# Patient Record
Sex: Female | Born: 1945 | Race: White | Hispanic: No | Marital: Married | Smoking: Never smoker
Health system: Southern US, Community
[De-identification: ages and names within clinical notes are randomized; demographics above are authoritative.]

## PROBLEM LIST (undated history)

## (undated) DIAGNOSIS — I10 Essential (primary) hypertension: Secondary | ICD-10-CM

## (undated) DIAGNOSIS — I219 Acute myocardial infarction, unspecified: Secondary | ICD-10-CM

## (undated) DIAGNOSIS — M1990 Unspecified osteoarthritis, unspecified site: Secondary | ICD-10-CM

## (undated) DIAGNOSIS — I209 Angina pectoris, unspecified: Secondary | ICD-10-CM

## (undated) DIAGNOSIS — I2510 Atherosclerotic heart disease of native coronary artery without angina pectoris: Secondary | ICD-10-CM

## (undated) DIAGNOSIS — E039 Hypothyroidism, unspecified: Secondary | ICD-10-CM

## (undated) DIAGNOSIS — C801 Malignant (primary) neoplasm, unspecified: Secondary | ICD-10-CM

## (undated) DIAGNOSIS — R0600 Dyspnea, unspecified: Secondary | ICD-10-CM

## (undated) DIAGNOSIS — K219 Gastro-esophageal reflux disease without esophagitis: Secondary | ICD-10-CM

## (undated) DIAGNOSIS — I509 Heart failure, unspecified: Secondary | ICD-10-CM

## (undated) DIAGNOSIS — T7840XA Allergy, unspecified, initial encounter: Secondary | ICD-10-CM

## (undated) DIAGNOSIS — G4730 Sleep apnea, unspecified: Secondary | ICD-10-CM

## (undated) DIAGNOSIS — H269 Unspecified cataract: Secondary | ICD-10-CM

## (undated) HISTORY — DX: Atherosclerotic heart disease of native coronary artery without angina pectoris: I25.10

## (undated) HISTORY — DX: Allergy, unspecified, initial encounter: T78.40XA

## (undated) HISTORY — PX: HERNIA REPAIR: SHX51

## (undated) HISTORY — PX: ABDOMINAL HYSTERECTOMY: SHX81

## (undated) HISTORY — DX: Malignant (primary) neoplasm, unspecified: C80.1

## (undated) HISTORY — PX: BUNIONECTOMY: SHX129

## (undated) HISTORY — PX: BLADDER SURGERY: SHX569

## (undated) HISTORY — DX: Heart failure, unspecified: I50.9

## (undated) HISTORY — PX: CHOLECYSTECTOMY: SHX55

## (undated) HISTORY — PX: APPENDECTOMY: SHX54

## (undated) HISTORY — DX: Sleep apnea, unspecified: G47.30

## (undated) HISTORY — PX: COLON SURGERY: SHX602

## (undated) HISTORY — PX: CATARACT EXTRACTION: SUR2

## (undated) HISTORY — DX: Unspecified osteoarthritis, unspecified site: M19.90

## (undated) HISTORY — DX: Unspecified cataract: H26.9

## (undated) HISTORY — DX: Acute myocardial infarction, unspecified: I21.9

---

## 2013-09-13 DIAGNOSIS — M79606 Pain in leg, unspecified: Secondary | ICD-10-CM | POA: Insufficient documentation

## 2013-09-13 DIAGNOSIS — M6281 Muscle weakness (generalized): Secondary | ICD-10-CM | POA: Insufficient documentation

## 2014-02-20 DIAGNOSIS — E039 Hypothyroidism, unspecified: Secondary | ICD-10-CM | POA: Insufficient documentation

## 2014-02-20 DIAGNOSIS — M1990 Unspecified osteoarthritis, unspecified site: Secondary | ICD-10-CM | POA: Insufficient documentation

## 2014-03-15 DIAGNOSIS — E663 Overweight: Secondary | ICD-10-CM | POA: Insufficient documentation

## 2015-08-08 DIAGNOSIS — H9190 Unspecified hearing loss, unspecified ear: Secondary | ICD-10-CM | POA: Insufficient documentation

## 2016-06-02 DIAGNOSIS — G8929 Other chronic pain: Secondary | ICD-10-CM | POA: Insufficient documentation

## 2016-06-02 DIAGNOSIS — F5104 Psychophysiologic insomnia: Secondary | ICD-10-CM | POA: Insufficient documentation

## 2016-06-02 DIAGNOSIS — Z85828 Personal history of other malignant neoplasm of skin: Secondary | ICD-10-CM | POA: Insufficient documentation

## 2016-10-13 DIAGNOSIS — Z85038 Personal history of other malignant neoplasm of large intestine: Secondary | ICD-10-CM | POA: Insufficient documentation

## 2017-05-10 DIAGNOSIS — D239 Other benign neoplasm of skin, unspecified: Secondary | ICD-10-CM | POA: Insufficient documentation

## 2017-05-28 DIAGNOSIS — E559 Vitamin D deficiency, unspecified: Secondary | ICD-10-CM | POA: Insufficient documentation

## 2017-09-04 DIAGNOSIS — I700 Atherosclerosis of aorta: Secondary | ICD-10-CM | POA: Insufficient documentation

## 2017-10-22 DIAGNOSIS — F3341 Major depressive disorder, recurrent, in partial remission: Secondary | ICD-10-CM | POA: Insufficient documentation

## 2018-02-24 DIAGNOSIS — H9313 Tinnitus, bilateral: Secondary | ICD-10-CM | POA: Insufficient documentation

## 2018-04-10 DIAGNOSIS — E7800 Pure hypercholesterolemia, unspecified: Secondary | ICD-10-CM | POA: Insufficient documentation

## 2018-08-23 DIAGNOSIS — I779 Disorder of arteries and arterioles, unspecified: Secondary | ICD-10-CM | POA: Insufficient documentation

## 2019-06-09 DIAGNOSIS — M7711 Lateral epicondylitis, right elbow: Secondary | ICD-10-CM | POA: Insufficient documentation

## 2019-10-03 DIAGNOSIS — Z7189 Other specified counseling: Secondary | ICD-10-CM | POA: Insufficient documentation

## 2019-10-05 DIAGNOSIS — M67921 Unspecified disorder of synovium and tendon, right upper arm: Secondary | ICD-10-CM | POA: Insufficient documentation

## 2019-10-06 DIAGNOSIS — K439 Ventral hernia without obstruction or gangrene: Secondary | ICD-10-CM | POA: Insufficient documentation

## 2020-08-02 DIAGNOSIS — F32A Depression, unspecified: Secondary | ICD-10-CM | POA: Insufficient documentation

## 2020-12-22 ENCOUNTER — Encounter
Admission: EM | Disposition: A | Discharge status: Home / Self Care | Payer: Self-pay | Source: Home / Self Care | Attending: Internal Medicine

## 2020-12-22 ENCOUNTER — Emergency Department: Payer: Medicare Other

## 2020-12-22 ENCOUNTER — Other Ambulatory Visit: Payer: Self-pay

## 2020-12-22 ENCOUNTER — Inpatient Hospital Stay
Admission: EM | Admit: 2020-12-22 | Discharge: 2020-12-26 | DRG: 246 | Disposition: A | Discharge status: Home / Self Care | Payer: Medicare Other | Attending: Internal Medicine | Admitting: Internal Medicine

## 2020-12-22 ENCOUNTER — Encounter: Payer: Self-pay | Admitting: Registered Nurse

## 2020-12-22 DIAGNOSIS — I2102 ST elevation (STEMI) myocardial infarction involving left anterior descending coronary artery: Principal | ICD-10-CM

## 2020-12-22 DIAGNOSIS — I10 Essential (primary) hypertension: Secondary | ICD-10-CM | POA: Diagnosis not present

## 2020-12-22 DIAGNOSIS — E669 Obesity, unspecified: Secondary | ICD-10-CM | POA: Diagnosis present

## 2020-12-22 DIAGNOSIS — J1282 Pneumonia due to coronavirus disease 2019: Secondary | ICD-10-CM

## 2020-12-22 DIAGNOSIS — E039 Hypothyroidism, unspecified: Secondary | ICD-10-CM | POA: Diagnosis present

## 2020-12-22 DIAGNOSIS — J9601 Acute respiratory failure with hypoxia: Secondary | ICD-10-CM | POA: Diagnosis not present

## 2020-12-22 DIAGNOSIS — Z85038 Personal history of other malignant neoplasm of large intestine: Secondary | ICD-10-CM | POA: Diagnosis not present

## 2020-12-22 DIAGNOSIS — Z6828 Body mass index (BMI) 28.0-28.9, adult: Secondary | ICD-10-CM | POA: Diagnosis not present

## 2020-12-22 DIAGNOSIS — I255 Ischemic cardiomyopathy: Secondary | ICD-10-CM | POA: Diagnosis present

## 2020-12-22 DIAGNOSIS — Z885 Allergy status to narcotic agent status: Secondary | ICD-10-CM | POA: Diagnosis not present

## 2020-12-22 DIAGNOSIS — I5023 Acute on chronic systolic (congestive) heart failure: Secondary | ICD-10-CM | POA: Diagnosis not present

## 2020-12-22 DIAGNOSIS — I214 Non-ST elevation (NSTEMI) myocardial infarction: Secondary | ICD-10-CM

## 2020-12-22 DIAGNOSIS — K5289 Other specified noninfective gastroenteritis and colitis: Secondary | ICD-10-CM | POA: Diagnosis present

## 2020-12-22 DIAGNOSIS — E785 Hyperlipidemia, unspecified: Secondary | ICD-10-CM | POA: Diagnosis present

## 2020-12-22 DIAGNOSIS — E876 Hypokalemia: Secondary | ICD-10-CM | POA: Diagnosis present

## 2020-12-22 DIAGNOSIS — R197 Diarrhea, unspecified: Secondary | ICD-10-CM

## 2020-12-22 DIAGNOSIS — R112 Nausea with vomiting, unspecified: Secondary | ICD-10-CM

## 2020-12-22 DIAGNOSIS — I110 Hypertensive heart disease with heart failure: Secondary | ICD-10-CM | POA: Diagnosis present

## 2020-12-22 DIAGNOSIS — R0600 Dyspnea, unspecified: Secondary | ICD-10-CM

## 2020-12-22 DIAGNOSIS — R001 Bradycardia, unspecified: Secondary | ICD-10-CM | POA: Diagnosis not present

## 2020-12-22 DIAGNOSIS — I2510 Atherosclerotic heart disease of native coronary artery without angina pectoris: Secondary | ICD-10-CM | POA: Diagnosis present

## 2020-12-22 DIAGNOSIS — J9811 Atelectasis: Secondary | ICD-10-CM | POA: Diagnosis not present

## 2020-12-22 DIAGNOSIS — R079 Chest pain, unspecified: Secondary | ICD-10-CM

## 2020-12-22 DIAGNOSIS — U071 COVID-19: Secondary | ICD-10-CM

## 2020-12-22 DIAGNOSIS — I2109 ST elevation (STEMI) myocardial infarction involving other coronary artery of anterior wall: Secondary | ICD-10-CM | POA: Diagnosis present

## 2020-12-22 DIAGNOSIS — I213 ST elevation (STEMI) myocardial infarction of unspecified site: Secondary | ICD-10-CM | POA: Diagnosis present

## 2020-12-22 HISTORY — PX: CORONARY/GRAFT ACUTE MI REVASCULARIZATION: CATH118305

## 2020-12-22 HISTORY — DX: Essential (primary) hypertension: I10

## 2020-12-22 HISTORY — PX: LEFT HEART CATH AND CORONARY ANGIOGRAPHY: CATH118249

## 2020-12-22 LAB — CBC WITH DIFFERENTIAL/PLATELET
Abs Immature Granulocytes: 0.03 10*3/uL (ref 0.00–0.07)
Basophils Absolute: 0.1 10*3/uL (ref 0.0–0.1)
Basophils Relative: 1 %
Eosinophils Absolute: 0.1 10*3/uL (ref 0.0–0.5)
Eosinophils Relative: 2 %
HCT: 33.6 % — ABNORMAL LOW (ref 36.0–46.0)
Hemoglobin: 10.8 g/dL — ABNORMAL LOW (ref 12.0–15.0)
Immature Granulocytes: 1 %
Lymphocytes Relative: 18 %
Lymphs Abs: 1.1 10*3/uL (ref 0.7–4.0)
MCH: 26.3 pg (ref 26.0–34.0)
MCHC: 32.1 g/dL (ref 30.0–36.0)
MCV: 81.8 fL (ref 80.0–100.0)
Monocytes Absolute: 1 10*3/uL (ref 0.1–1.0)
Monocytes Relative: 16 %
Neutro Abs: 3.9 10*3/uL (ref 1.7–7.7)
Neutrophils Relative %: 62 %
Platelets: 299 10*3/uL (ref 150–400)
RBC: 4.11 MIL/uL (ref 3.87–5.11)
RDW: 14.3 % (ref 11.5–15.5)
WBC: 6.2 10*3/uL (ref 4.0–10.5)
nRBC: 0 % (ref 0.0–0.2)

## 2020-12-22 LAB — TROPONIN I (HIGH SENSITIVITY)
Troponin I (High Sensitivity): 60 ng/L — ABNORMAL HIGH (ref <18)
Troponin I (High Sensitivity): >27000 ng/L (ref <18)
Troponin I (High Sensitivity): >27000 ng/L (ref <18)

## 2020-12-22 LAB — COMPREHENSIVE METABOLIC PANEL
ALT: 24 U/L (ref 0–44)
AST: 29 U/L (ref 15–41)
Albumin: 3.7 g/dL (ref 3.5–5.0)
Alkaline Phosphatase: 66 U/L (ref 38–126)
Anion gap: 11 (ref 5–15)
BUN: 20 mg/dL (ref 8–23)
CO2: 27 mmol/L (ref 22–32)
Calcium: 8.7 mg/dL — ABNORMAL LOW (ref 8.9–10.3)
Chloride: 95 mmol/L — ABNORMAL LOW (ref 98–111)
Creatinine, Ser: 0.96 mg/dL (ref 0.44–1.00)
GFR, Estimated: >60 mL/min (ref >60)
Glucose, Bld: 130 mg/dL — ABNORMAL HIGH (ref 70–99)
Potassium: 3 mmol/L — ABNORMAL LOW (ref 3.5–5.1)
Sodium: 133 mmol/L — ABNORMAL LOW (ref 135–145)
Total Bilirubin: 0.7 mg/dL (ref 0.3–1.2)
Total Protein: 6.1 g/dL — ABNORMAL LOW (ref 6.5–8.1)

## 2020-12-22 LAB — RESP PANEL BY RT-PCR (FLU A&B, COVID) ARPGX2
Influenza A by PCR: NEGATIVE
Influenza B by PCR: NEGATIVE
SARS Coronavirus 2 by RT PCR: POSITIVE — AB

## 2020-12-22 LAB — MRSA PCR SCREENING: MRSA by PCR: NEGATIVE

## 2020-12-22 LAB — LIPID PANEL
Cholesterol: 191 mg/dL (ref 0–200)
HDL: 70 mg/dL (ref >40)
LDL Cholesterol: 92 mg/dL (ref 0–99)
Total CHOL/HDL Ratio: 2.7 RATIO
Triglycerides: 144 mg/dL (ref <150)
VLDL: 29 mg/dL (ref 0–40)

## 2020-12-22 LAB — BRAIN NATRIURETIC PEPTIDE: B Natriuretic Peptide: 185.2 pg/mL — ABNORMAL HIGH (ref 0.0–100.0)

## 2020-12-22 LAB — HEMOGLOBIN A1C
Hgb A1c MFr Bld: 5.7 % — ABNORMAL HIGH (ref 4.8–5.6)
Mean Plasma Glucose: 116.89 mg/dL

## 2020-12-22 LAB — GLUCOSE, CAPILLARY: Glucose-Capillary: 125 mg/dL — ABNORMAL HIGH (ref 70–99)

## 2020-12-22 LAB — POCT ACTIVATED CLOTTING TIME: Activated Clotting Time: 553 seconds

## 2020-12-22 LAB — PROTIME-INR
INR: 0.9 (ref 0.8–1.2)
Prothrombin Time: 11.9 seconds (ref 11.4–15.2)

## 2020-12-22 LAB — APTT: aPTT: 29 seconds (ref 24–36)

## 2020-12-22 SURGERY — CORONARY/GRAFT ACUTE MI REVASCULARIZATION
Anesthesia: Moderate Sedation

## 2020-12-22 MED ORDER — LIDOCAINE HCL (PF) 1 % IJ SOLN
INTRAMUSCULAR | Status: DC | PRN
  Administered 2020-12-22: 30 mL

## 2020-12-22 MED ORDER — SODIUM CHLORIDE 0.9% FLUSH
3.0000 mL | Freq: Two times a day (BID) | INTRAVENOUS | Status: DC
  Administered 2020-12-23 – 2020-12-26 (×8): 3 mL via INTRAVENOUS

## 2020-12-22 MED ORDER — HEPARIN (PORCINE) 25000 UT/250ML-% IV SOLN: 850.0000 [IU]/h | INTRAVENOUS | Status: DC

## 2020-12-22 MED ORDER — ATORVASTATIN CALCIUM 80 MG PO TABS
80.0000 mg | ORAL_TABLET | Freq: Every day | ORAL | Status: DC
  Administered 2020-12-23 – 2020-12-26 (×4): 80 mg via ORAL
  Filled 2020-12-22 (×2): qty 1
  Filled 2020-12-22: qty 4
  Filled 2020-12-22: qty 1

## 2020-12-22 MED ORDER — TICAGRELOR 90 MG PO TABS
90.0000 mg | ORAL_TABLET | Freq: Two times a day (BID) | ORAL | Status: DC
  Administered 2020-12-22 – 2020-12-26 (×8): 90 mg via ORAL
  Filled 2020-12-22 (×8): qty 1

## 2020-12-22 MED ORDER — HEPARIN (PORCINE) IN NACL 1000-0.9 UT/500ML-% IV SOLN
INTRAVENOUS | Status: AC
  Filled 2020-12-22: qty 1000

## 2020-12-22 MED ORDER — MIDAZOLAM HCL 2 MG/2ML IJ SOLN
INTRAMUSCULAR | Status: AC
  Filled 2020-12-22: qty 2

## 2020-12-22 MED ORDER — HEPARIN (PORCINE) IN NACL 2000-0.9 UNIT/L-% IV SOLN
INTRAVENOUS | Status: DC | PRN
  Administered 2020-12-22: 1000 mL

## 2020-12-22 MED ORDER — SODIUM CHLORIDE 0.9 % IV SOLN
INTRAVENOUS | Status: AC | PRN
  Administered 2020-12-22: 1.75 mg/kg/h via INTRAVENOUS

## 2020-12-22 MED ORDER — FENTANYL CITRATE (PF) 100 MCG/2ML IJ SOLN
INTRAMUSCULAR | Status: AC
  Filled 2020-12-22: qty 2

## 2020-12-22 MED ORDER — SODIUM CHLORIDE 0.9 % IV SOLN
250.0000 mL | INTRAVENOUS | Status: DC | PRN
  Administered 2020-12-26 (×2): 250 mL via INTRAVENOUS

## 2020-12-22 MED ORDER — LISINOPRIL 5 MG PO TABS
2.5000 mg | ORAL_TABLET | Freq: Every day | ORAL | Status: DC
  Filled 2020-12-22 (×2): qty 1

## 2020-12-22 MED ORDER — LABETALOL HCL 5 MG/ML IV SOLN: 10.0000 mg | INTRAVENOUS | Status: AC | PRN

## 2020-12-22 MED ORDER — SODIUM CHLORIDE 0.9 % WEIGHT BASED INFUSION
1.0000 mL/kg/h | INTRAVENOUS | Status: AC
  Administered 2020-12-22 – 2020-12-23 (×2): 1 mL/kg/h via INTRAVENOUS

## 2020-12-22 MED ORDER — ASPIRIN 81 MG PO CHEW
81.0000 mg | CHEWABLE_TABLET | Freq: Every day | ORAL | Status: DC
  Administered 2020-12-23 – 2020-12-26 (×4): 81 mg via ORAL
  Filled 2020-12-22 (×4): qty 1

## 2020-12-22 MED ORDER — LIDOCAINE HCL (PF) 1 % IJ SOLN
INTRAMUSCULAR | Status: AC
  Filled 2020-12-22: qty 30

## 2020-12-22 MED ORDER — ACETAMINOPHEN 325 MG PO TABS
650.0000 mg | ORAL_TABLET | ORAL | Status: DC | PRN
  Administered 2020-12-23 (×3): 650 mg via ORAL
  Filled 2020-12-22 (×3): qty 2

## 2020-12-22 MED ORDER — SODIUM CHLORIDE 0.9 % IV SOLN
0.2500 mg/kg/h | INTRAVENOUS | Status: AC
  Filled 2020-12-22: qty 250

## 2020-12-22 MED ORDER — BIVALIRUDIN BOLUS VIA INFUSION - CUPID
INTRAVENOUS | Status: DC | PRN
  Administered 2020-12-22: 58.5 mg via INTRAVENOUS

## 2020-12-22 MED ORDER — TICAGRELOR 90 MG PO TABS
ORAL_TABLET | ORAL | Status: AC
  Filled 2020-12-22: qty 2

## 2020-12-22 MED ORDER — FENTANYL CITRATE (PF) 100 MCG/2ML IJ SOLN
INTRAMUSCULAR | Status: DC | PRN
  Administered 2020-12-22 (×2): 50 ug via INTRAVENOUS

## 2020-12-22 MED ORDER — SODIUM CHLORIDE 0.9 % IV BOLUS
1000.0000 mL | Freq: Once | INTRAVENOUS | Status: AC
  Administered 2020-12-22: 1000 mL via INTRAVENOUS

## 2020-12-22 MED ORDER — MIDAZOLAM HCL 2 MG/2ML IJ SOLN
INTRAMUSCULAR | Status: DC | PRN
  Administered 2020-12-22 (×2): 1 mg via INTRAVENOUS

## 2020-12-22 MED ORDER — BIVALIRUDIN TRIFLUOROACETATE 250 MG IV SOLR
INTRAVENOUS | Status: AC
  Filled 2020-12-22: qty 250

## 2020-12-22 MED ORDER — HEPARIN (PORCINE) 25000 UT/250ML-% IV SOLN
INTRAVENOUS | Status: AC
  Administered 2020-12-22: 850 [IU]/h via INTRAVENOUS
  Filled 2020-12-22: qty 250

## 2020-12-22 MED ORDER — SODIUM CHLORIDE 0.9 % IV SOLN: INTRAVENOUS | Status: DC

## 2020-12-22 MED ORDER — SODIUM CHLORIDE 0.9% FLUSH
3.0000 mL | INTRAVENOUS | Status: DC | PRN
  Administered 2020-12-24: 3 mL via INTRAVENOUS

## 2020-12-22 MED ORDER — HEPARIN SODIUM (PORCINE) 5000 UNIT/ML IJ SOLN
4000.0000 [IU] | Freq: Once | INTRAMUSCULAR | Status: AC
  Administered 2020-12-22: 4000 [IU] via INTRAVENOUS
  Filled 2020-12-22: qty 1

## 2020-12-22 MED ORDER — METOPROLOL TARTRATE 25 MG PO TABS
12.5000 mg | ORAL_TABLET | Freq: Two times a day (BID) | ORAL | Status: DC
  Administered 2020-12-22 – 2020-12-23 (×3): 12.5 mg via ORAL
  Filled 2020-12-22: qty 1
  Filled 2020-12-22: qty 0.5
  Filled 2020-12-22 (×2): qty 1

## 2020-12-22 MED ORDER — HYDRALAZINE HCL 20 MG/ML IJ SOLN: 10.0000 mg | INTRAMUSCULAR | Status: AC | PRN

## 2020-12-22 MED ORDER — IOHEXOL 300 MG/ML  SOLN
INTRAMUSCULAR | Status: DC | PRN
  Administered 2020-12-22: 170 mL

## 2020-12-22 MED ORDER — ONDANSETRON HCL 4 MG/2ML IJ SOLN
4.0000 mg | Freq: Four times a day (QID) | INTRAMUSCULAR | Status: DC | PRN
  Administered 2020-12-23 (×2): 4 mg via INTRAVENOUS
  Filled 2020-12-22 (×2): qty 2

## 2020-12-22 MED ORDER — TICAGRELOR 90 MG PO TABS
ORAL_TABLET | ORAL | Status: DC | PRN
  Administered 2020-12-22: 180 mg via ORAL

## 2020-12-22 SURGICAL SUPPLY — 17 items
BALLN TREK RX 2.5X15 (BALLOONS) ×2
BALLN ~~LOC~~ TREK RX 2.75X15 (BALLOONS) ×2
BALLOON TREK RX 2.5X15 (BALLOONS) ×1 IMPLANT
BALLOON ~~LOC~~ TREK RX 2.75X15 (BALLOONS) ×1 IMPLANT
CATH INFINITI 5FR JL4 (CATHETERS) ×2 IMPLANT
CATH INFINITI JR4 5F (CATHETERS) ×2 IMPLANT
CATH VISTA GUIDE 6FR XB3.5 (CATHETERS) ×2 IMPLANT
DEVICE CLOSURE MYNXGRIP 6/7F (Vascular Products) ×2 IMPLANT
DEVICE SAFEGUARD 24CM (GAUZE/BANDAGES/DRESSINGS) ×2 IMPLANT
KIT ENCORE 26 ADVANTAGE (KITS) ×2 IMPLANT
KIT MANI 3VAL PERCEP (MISCELLANEOUS) ×2 IMPLANT
NEEDLE PERC 18GX7CM (NEEDLE) ×2 IMPLANT
PACK CARDIAC CATH (CUSTOM PROCEDURE TRAY) ×2 IMPLANT
SHEATH AVANTI 6FR X 11CM (SHEATH) ×2 IMPLANT
STENT RESOLUTE ONYX 2.5X38 (Permanent Stent) ×2 IMPLANT
WIRE G HI TQ BMW 190 (WIRE) ×2 IMPLANT
WIRE GUIDERIGHT .035X150 (WIRE) ×2 IMPLANT

## 2020-12-22 NOTE — ED Notes (Signed)
Cardiologist at bedside at this time.

## 2020-12-22 NOTE — ED Provider Notes (Signed)
[facility] Emergency Department Provider Note   ____________________________________________   Event Date/Time   First MD Initiated Contact with Patient 12/22/20 1521     (approximate)  I have reviewed the triage vital signs and the nursing notes.   HISTORY  Chief Complaint Code STEMI    HPI Dyesha R Trace is a 75 y.o. female with a stated past medical history of hypertension who presents via EMS from a grocery store complaining of chest pain, nausea/vomiting/diarrhea, and left arm pain.  Patient states that this chest pain has been intermittent for the last 2 weeks without any exacerbating or factors but had sudden worsening today.  Patient was on the toilet and felt as if she was going to pass out and that is when she called EMS.  Patient was given 324 of aspirin as well as nitroglycerin spray which improved her pain prior to arrival.  EKG was transmitted by EMS with concerns for possible STEMI with elevation in anterior leads.  Patient currently denies any vision changes, tinnitus, difficulty speaking, facial droop, sore throat, abdominal pain, dysuria, or weakness/numbness/paresthesias in any extremity         Past Medical History:  Diagnosis Date  . Hypertension     There are no problems to display for this patient.   History reviewed. No pertinent surgical history.  Prior to Admission medications   Not on File    Allergies Morphine and related  History reviewed. No pertinent family history.  Social History    Review of Systems Constitutional: No fever/chills Eyes: No visual changes. ENT: No sore throat. Cardiovascular: Endorses chest pain. Respiratory: Denies shortness of breath. Gastrointestinal: No abdominal pain.  Endorses nausea/vomiting/diarrhea Genitourinary: Negative for dysuria. Musculoskeletal: Negative for acute arthralgias Skin: Negative for rash. Neurological: Negative for headaches, weakness/numbness/paresthesias  in any extremity Psychiatric: Negative for suicidal ideation/homicidal ideation   ____________________________________________   PHYSICAL EXAM:  VITAL SIGNS: ED Triage Vitals  Enc Vitals Group     BP --      Pulse Rate 12/22/20 1523 63     Resp 12/22/20 1523 20     Temp 12/22/20 1523 98.8 F (37.1 C)     Temp Source 12/22/20 1523 Oral     SpO2 12/22/20 1523 96 %     Weight 12/22/20 1524 171 lb 15.3 oz (78 kg)     Height 12/22/20 1524 5\' 4"  (1.626 m)     Head Circumference --      Peak Flow --      Pain Score 12/22/20 1523 2     Pain Loc --      Pain Edu? --      Excl. in [place]? --    Constitutional: Alert and oriented. Well appearing and in no acute distress. Eyes: Conjunctivae are normal. PERRL. Head: Atraumatic. Nose: No congestion/rhinnorhea. Mouth/Throat: Mucous membranes are moist. Neck: No stridor Cardiovascular: Grossly normal heart sounds.  Good peripheral circulation. Respiratory: Normal respiratory effort.  No retractions. Gastrointestinal: Soft and nontender. No distention. Musculoskeletal: No obvious deformities Neurologic:  Normal speech and language. No gross focal neurologic deficits are appreciated. Skin:  Skin is warm and dry. No rash noted. Psychiatric: Mood and affect are normal. Speech and behavior are normal.  ____________________________________________   LABS (all labs ordered are listed, but only abnormal results are displayed)  Labs Reviewed  RESP PANEL BY RT-PCR (FLU A&B, COVID) ARPGX2 - Abnormal; Notable for the following components:      Result Value   SARS Coronavirus  2 by RT PCR POSITIVE (*)    All other components within normal limits  CBC WITH DIFFERENTIAL/PLATELET - Abnormal; Notable for the following components:   Hemoglobin 10.8 (*)    HCT 33.6 (*)    All other components within normal limits  COMPREHENSIVE METABOLIC PANEL - Abnormal; Notable for the following components:   Sodium 133 (*)    Potassium 3.0 (*)    Chloride 95  (*)    Glucose, Bld 130 (*)    Calcium 8.7 (*)    Total Protein 6.1 (*)    All other components within normal limits  BRAIN NATRIURETIC PEPTIDE - Abnormal; Notable for the following components:   B Natriuretic Peptide 185.2 (*)    All other components within normal limits  TROPONIN I (HIGH SENSITIVITY) - Abnormal; Notable for the following components:   Troponin I (High Sensitivity) 60 (*)    All other components within normal limits  PROTIME-INR  APTT  LIPID PANEL  HEMOGLOBIN A1C  HEPARIN LEVEL (UNFRACTIONATED)   ____________________________________________  EKG  ED ECG REPORT I, Merwyn Katos, the attending physician, personally viewed and interpreted this ECG.  Date: 12/22/2020 EKG Time: 1521 Rate: 58 Rhythm: normal sinus rhythm QRS Axis: normal Intervals: normal ST/T Wave abnormalities: normal Narrative Interpretation: Elevation in V1/V2/V3 with minimal reciprocal change in inferior leads  ____________________________________________  RADIOLOGY  ED MD interpretation: Single view portable x-ray of the chest shows a mild patchy opacity at the right lung base concerning for pneumonia.  No evidence of pneumothorax or widened mediastinum  Official radiology report(s): DG Chest Port 1 View  Result Date: 12/22/2020 CLINICAL DATA:  Chest pain. EXAM: PORTABLE CHEST 1 VIEW COMPARISON:  None. FINDINGS: The heart size and mediastinal contours are within normal limits. Mild patchy opacity of right lung base is noted. There is no pleural effusion or pulmonary edema. The visualized skeletal structures are unremarkable. IMPRESSION: Mild patchy opacity of right lung base, suspicious for pneumonia. Electronically Signed   By: Sherian Rein M.D.   On: 12/22/2020 15:48    ____________________________________________   PROCEDURES  Procedure(s) performed (including Critical Care):  .Critical Care Performed by: Merwyn Katos, MD Authorized by: Merwyn Katos, MD   Critical  care provider statement:    Critical care time (minutes):  17   Critical care time was exclusive of:  Separately billable procedures and treating other patients   Critical care was necessary to treat or prevent imminent or life-threatening deterioration of the following conditions:  Cardiac failure   Critical care was time spent personally by me on the following activities:  Discussions with consultants, evaluation of patient's response to treatment, examination of patient, ordering and performing treatments and interventions, ordering and review of laboratory studies, ordering and review of radiographic studies, pulse oximetry, re-evaluation of patient's condition, obtaining history from patient or surrogate and review of old charts   I assumed direction of critical care for this patient from another provider in my specialty: no     Care discussed with: admitting provider   .1-3 Lead EKG Interpretation Performed by: Merwyn Katos, MD Authorized by: Merwyn Katos, MD     Interpretation: normal     ECG rate:  61   ECG rate assessment: normal     Rhythm: sinus rhythm     Ectopy: none     Conduction: normal       ____________________________________________   INITIAL IMPRESSION / ASSESSMENT AND PLAN / ED COURSE  As part of my  medical decision making, I reviewed the following data within the [place] notes reviewed and incorporated, Labs reviewed, EKG interpreted, Old chart reviewed, Radiograph reviewed and Notes from prior ED visits reviewed and incorporated        Pt presents via EMS with active chest pain and an EKG concerning for ST Elevation MI. Pt received 324mg  aspirin, and sublingual nitroglycerin spray in the field. Patient was otherwise in their normal state of health before this chest pain rapidly ensued.  DDx: TAA or dissection, pericarditis/tamponade, PTX.  Workup: CBC, BMP, troponin, T&S, PT/INR, PTT, Repeat EKGs until cath lab,  CXR.  Therapy: Not hypoxic, not requiring O2. Completed full ASA load 324mg . Anticoagulation: Pt <75yo and with suspected normal kidney function, will start Heparin bolus 60U/kg followed by 12U/kg/h gtt.  Disposition: Admit directly to Cath Lab.      ____________________________________________   FINAL CLINICAL IMPRESSION(S) / ED DIAGNOSES  Final diagnoses:  Chest pain of uncertain etiology  Non-ST elevation (NSTEMI) myocardial infarction (HCC)  Nausea vomiting and diarrhea     ED Discharge Orders    None       Note:  This document was prepared using Dragon voice recognition software and may include unintentional dictation errors.   Naaman Plummer, MD 12/22/20 [phone]

## 2020-12-22 NOTE — H&P (Signed)
History and Physical    PLEASE NOTE THAT DRAGON DICTATION SOFTWARE WAS USED IN THE CONSTRUCTION OF THIS NOTE.   [name]:[id number] DOB: May 09, 1946 DOA: 12/22/2020  PCP: No primary care provider on file. Patient coming from: home   I have personally briefly reviewed patient's old medical records in [place]  Chief Complaint: STEMI s/p PCI with stent to LAD  HPI: Karen Madden is a 75 y.o. female with medical history significant for hypertension who is admitted to [facility] on 12/22/2020 with STEMI after being taken from [facility] ED directly to cath lab after presenting  to [facility] Emergency Department complaining of chest pain.   Patient taken to Cath Lab by Dr. Clayborn Bigness of cardiology with coronary angiography revealing ostial occlusion of the LAD, prompting PCI with placement of DES to the proximal LAD.  Postoperatively, patient to be admitted to the Hospitalist service for further monitoring in the setting of presenting STEMI status post angiography with drug-eluting stent placement to the proximal LAD, with Dr. Clayborn Bigness of cardiology to continue to follow on a consultation basis.  Patient without any chest pain postoperatively.  Also denies any palpitations, diaphoresis, nausea, or vomiting at this time.  Denies any orthopnea, PND.  Of note, routine screening nasopharyngeal COVID-19 PCR was found to be positive today.  The patient denies any recent shortness of breath, cough, wheezing.  Also denies any recent subjective fever, chills, rigors, or generalized myalgias.  Denies any recent headache, neck stiffness, rhinitis, rhinorrhea, or sore throat.  No recent known COVID-19 exposures.  Medical history notable for essential hypertension, for which the patient is managing via lifestyle modifications.     Review of Systems: As per HPI otherwise 10 point review of systems negative.   Past Medical History:  Diagnosis Date  . Hypertension     History  reviewed. No pertinent surgical history.  Social History:  has no history on file for tobacco use, alcohol use, and drug use.   Allergies  Allergen Reactions  . Morphine And Related Anxiety    Hallucinations, nausea, vomiting, "doesn't help my pain at all"    History reviewed. No pertinent family history.  Outpatient medications: Denies any use of prescribed or over-the-counter medications as an outpatient.    Objective    Physical Exam: Vitals:   12/22/20 1530 12/22/20 1541 12/22/20 1544 12/22/20 1602  BP: (!) 87/60 (!) 95/55 98/68   Pulse: 60 63 62   Resp: 11 10 16    Temp:      TempSrc:      SpO2: 94% 95% 96% 98%  Weight:      Height:        General: appears to be stated age; alert, oriented Skin: warm, dry, no rash Head:  AT/[place] Mouth:  Oral mucosa membranes appear moist, normal dentition Neck: supple; trachea midline Heart:  RRR; did not appreciate any M/R/G Lungs: CTAB, did not appreciate any wheezes, rales, or rhonchi Abdomen: + BS; soft, ND, NT Vascular: 2+ pedal pulses b/l; 2+ radial pulses b/l Extremities: no peripheral edema, no muscle wasting Neuro: strength and sensation intact in upper and lower extremities b/l   Labs on Admission: I have personally reviewed following labs and imaging studies  CBC: Recent Labs  Lab 12/22/20 1521  WBC 6.2  NEUTROABS 3.9  HGB 10.8*  HCT 33.6*  MCV 81.8  PLT 299   Basic Metabolic Panel: Recent Labs  Lab 12/22/20 1521  NA 133*  K 3.0*  CL  95*  CO2 27  GLUCOSE 130*  BUN 20  CREATININE 0.96  CALCIUM 8.7*   GFR: Estimated Creatinine Clearance: 51.9 mL/min (by C-G formula based on SCr of 0.96 mg/dL). Liver Function Tests: Recent Labs  Lab 12/22/20 1521  AST 29  ALT 24  ALKPHOS 66  BILITOT 0.7  PROT 6.1*  ALBUMIN 3.7   No results for input(s): LIPASE, AMYLASE in the last 168 hours. No results for input(s): AMMONIA in the last 168 hours. Coagulation Profile: Recent Labs  Lab 12/22/20 1521   INR 0.9   Cardiac Enzymes: No results for input(s): CKTOTAL, CKMB, CKMBINDEX, TROPONINI in the last 168 hours. BNP (last 3 results) No results for input(s): PROBNP in the last 8760 hours. HbA1C: No results for input(s): HGBA1C in the last 72 hours. CBG: Recent Labs  Lab 12/22/20 1718  GLUCAP 125*   Lipid Profile: Recent Labs    12/22/20 1521  CHOL 191  HDL 70  LDLCALC 92  TRIG 144  CHOLHDL 2.7   Thyroid Function Tests: No results for input(s): TSH, T4TOTAL, FREET4, T3FREE, THYROIDAB in the last 72 hours. Anemia Panel: No results for input(s): VITAMINB12, FOLATE, FERRITIN, TIBC, IRON, RETICCTPCT in the last 72 hours. Urine analysis: No results found for: COLORURINE, APPEARANCEUR, LABSPEC, [place], GLUCOSEU, [place], [place], [place], [place], UROBILINOGEN, NITRITE, LEUKOCYTESUR  Radiological Exams on Admission: CARDIAC CATHETERIZATION  Result Date: 12/22/2020  Ost LAD lesion is 80% stenosed.  Prox LAD lesion is 100% stenosed.  Ost Cx to Prox Cx lesion is 25% stenosed.  Mid Cx to Dist Cx lesion is 50% stenosed.  Prox RCA to Dist RCA lesion is 25% stenosed with 25% stenosed side branch in RPAV.  Dist LAD lesion is 90% stenosed.  Moderately depressed left ventricular function between 30 to 35% with anterior apical akinesis  Conclusion STEMI presentation EKG with subtle anterior ST elevation but septal anterior Q waves Diagnostic cardiac cath showed large left main free of disease LAD 100% occluded ostially TIMI 0 flow Circumflex was large with just minor disease TIMI-3 flow RCA was medium in size, diffuse 25 to 50% Left ventricle Moderately depressed left ventricular function 30 to 35% anterior apical akinesis Intervention Successful PCI and stent of ostial proximal LAD with DES 2.5 x 34 mm resolute Onyx Postdilated with a 2.75 x 15 mm [place] trek to 14 atm TIMI-3 flow was restored Appeared to be residual very distal apical lesion of 90% which was not identified until flow was  restored Distal LAD lesion was deferred Patient identity incidentally tested positive for Covid while on the table Minx was deployed to right groin femoral artery Patient transferred to CCU for recovery Hospitalist service  notified  DG Chest Port 1 View  Result Date: 12/22/2020 CLINICAL DATA:  Chest pain. EXAM: PORTABLE CHEST 1 VIEW COMPARISON:  None. FINDINGS: The heart size and mediastinal contours are within normal limits. Mild patchy opacity of right lung base is noted. There is no pleural effusion or pulmonary edema. The visualized skeletal structures are unremarkable. IMPRESSION: Mild patchy opacity of right lung base, suspicious for pneumonia. Electronically Signed   By: Abelardo Diesel M.D.   On: 12/22/2020 15:48      Assessment/Plan   Karen Madden is a 75 y.o. female with medical history significant for hypertension who is admitted to [facility] on 12/22/2020 with STEMI after being taken from [facility] ED directly to cath lab after presenting  to [facility] Emergency Department complaining of chest pain.    Principal  Problem:   STEMI involving left anterior descending coronary artery (HCC) Active Problems:   STEMI (ST elevation myocardial infarction) ([place])   CAD (coronary artery disease)   HTN (hypertension)   COVID-19 virus infection    #) STEMI: taken from ED to Cath Lab by Dr. Clayborn Bigness of cardiology with coronary angiography revealing ostial occlusion of the LAD, prompting PCI with placement of DES to the proximal LAD. Post-operatively, patient chest pain free. Dr. Clayborn Bigness folllowing on consultation basis.  He recommends dual antiplatelet therapy with aspirin 81 mg p.o. daily and ticagrelor 90 mg p.o. twice daily for at least the next 12 months.  Additionally, he was placed orders for completion of Angiomax infusion.   Plan: Dual antiplatelet therapy for greater than 12 months, is recommended in documentation from Dr. Clayborn Bigness.  Echocardiogram per recommendation of  cardiology.  Trending of troponin as ordered by cardiology.  Monitor on telemetry.  Monitor continuous pulse oximetry.  Metoprolol tartrate, lisinopril, and high intensity atorvastatin as ordered by cardiology.  Repeat CBC in the morning.      #) COVID-19 infection: Screening COVID-19 performed upon arrival at [facility] found to be positive in the absence of any associated respiratory symptoms.  Additionally presentation has not been associated the acute hypoxic respiratory distress.  Consequently, criteria are not met for patient's COVID-19 infection to be considered severe in nature, and therefore there is not currently an indication for initiation of dexamethasone.  Furthermore, as the patient is not being admitted for COVID-19 infection, and has been asymptomatic from a respiratory standpoint, will refrain from initiation of remdesivir, thereby maintaining the possibility of receiving monoclonal antibody therapy as an outpatient.   Plan: Airborne and contact precautions.  Monitor continuous pulse oximetry.  Check general inflammatory markers in the morning to establish baseline.  Refraining from dexamethasone as well as remdesivir, per rationale above.  Repeat CBC in the morning.      #) Essential hypertension: Has a documented history of such, although this appears to have been managed via lifestyle modifications as an outpatient.  In the setting of new diagnosis of coronary artery disease following presenting STEMI, the patient has been started on metoprolol tartrate as well as lisinopril.  Plan: Continue metoprolol tartrate as well as lisinopril, per cardiology recommendations, as above.  Close monitoring of ensuing blood pressures via routine vital signs.  Repeat BMP in the morning.  Monitor strict I's and O's.    Code Status: Full code Family Communication: none Disposition Plan: Per Rounding Team Consults called: Dr. Clayborn Bigness of cardiology consulted, as further described above. Admission  status: inpatient    Of note, this patient was added by me to the following Admit List/Treatment Team:  armcadmits      PLEASE NOTE THAT DRAGON DICTATION SOFTWARE WAS USED IN THE CONSTRUCTION OF THIS NOTE.   [place] [facility] Pager [phone] From 12PM- 12AM  Otherwise, please contact night-coverage  [email or web address] Password [facility]  12/22/2020, 5:40 PM

## 2020-12-22 NOTE — ED Notes (Signed)
Pt husband is Jazzmen Restivo [phone]

## 2020-12-22 NOTE — Progress Notes (Signed)
ANTICOAGULATION CONSULT NOTE  Pharmacy Consult for Heparin infusion Indication: chest pain/ACS  Allergies  Allergen Reactions  . Morphine And Related Anxiety    Hallucinations, nausea, vomiting, "doesn't help my pain at all"    Patient Measurements: Height: 5\' 4"  (162.6 cm) Weight: 78 kg (171 lb 15.3 oz) IBW/kg (Calculated) : 54.7 Heparin Dosing Weight: 71.3 kg  Vital Signs: Temp: 98.8 F (37.1 C) (01/02 1523) Temp Source: Oral (01/02 1523) BP: 95/55 (01/02 1541) Pulse Rate: 63 (01/02 1541)  Labs: Recent Labs    12/22/20 1521  HGB 10.8*  HCT 33.6*  PLT 299    CrCl cannot be calculated (No successful lab value found.).   Medical History: Past Medical History:  Diagnosis Date  . Hypertension     Assessment: 75yo female with a past medical history of hypertension who presents via EMS from a grocery store complaining of chest pain, nausea/vomiting/diarrhea, and left arm pain. EKG was transmitted by EMS with concerns for possible STEMI with elevation in anterior leads. Pharmacy has been consulted for heparin dosing and monitoring for ACE/chest pain. Baseline labs WNL. No PTA anticoagulation upon chart review.   Goal of Therapy:  Heparin level 0.3-0.7 units/ml Monitor platelets by anticoagulation protocol: Yes   Plan:  Give 4000 units bolus x 1 Start heparin infusion at 850 units/hr Check anti-Xa level in 8 hours and daily while on heparin Continue to monitor H&H and platelets  75yo, PharmD Pharmacy Resident  12/22/2020 3:44 PM

## 2020-12-22 NOTE — Progress Notes (Signed)
Responded to PG to ED for code stemi. Pt was alert and talking. I looked for Pt's husband. I found Pt's husband and escorted Pt to Cath lab waiting room. Pt could not go to ICU because wife was Covid positive. Ch will follow up later.

## 2020-12-22 NOTE — ED Notes (Signed)
Chart labels and EKG delivered to cath lab

## 2020-12-22 NOTE — ED Notes (Signed)
Pt states 7/10 pain in right arm. States it sometimes hurts worse. Comes and goes

## 2020-12-22 NOTE — ED Notes (Signed)
Cardiologist at bedside.  

## 2020-12-22 NOTE — ED Notes (Signed)
Pt to cath lab, 2 RN's and cardiologist to transport. Handoff report given in cath lab. Pt belongings sent with pt. 1 bag of personals

## 2020-12-22 NOTE — ED Triage Notes (Signed)
Pt arrives form grocery store via [facility] with complaint of chest pain. Pt states she's been having chest pain for about two weeks, but  with sudden worsening today. Pt also experienced new nausea, diarrhea, and left arm pain. Pt states she felt like she was going to pass out.   EMS reports pt received nitro spray, zofran en route.

## 2020-12-23 ENCOUNTER — Encounter: Payer: Self-pay | Admitting: Internal Medicine

## 2020-12-23 ENCOUNTER — Inpatient Hospital Stay: Admit: 2020-12-23 | Payer: Medicare Other

## 2020-12-23 DIAGNOSIS — J1282 Pneumonia due to coronavirus disease 2019: Secondary | ICD-10-CM

## 2020-12-23 DIAGNOSIS — I10 Essential (primary) hypertension: Secondary | ICD-10-CM | POA: Diagnosis present

## 2020-12-23 DIAGNOSIS — U071 COVID-19: Secondary | ICD-10-CM | POA: Diagnosis present

## 2020-12-23 DIAGNOSIS — I2510 Atherosclerotic heart disease of native coronary artery without angina pectoris: Secondary | ICD-10-CM | POA: Diagnosis present

## 2020-12-23 LAB — CBC
HCT: 34 % — ABNORMAL LOW (ref 36.0–46.0)
Hemoglobin: 11.3 g/dL — ABNORMAL LOW (ref 12.0–15.0)
MCH: 26.8 pg (ref 26.0–34.0)
MCHC: 33.2 g/dL (ref 30.0–36.0)
MCV: 80.6 fL (ref 80.0–100.0)
Platelets: 282 10*3/uL (ref 150–400)
RBC: 4.22 MIL/uL (ref 3.87–5.11)
RDW: 14.4 % (ref 11.5–15.5)
WBC: 6.8 10*3/uL (ref 4.0–10.5)
nRBC: 0 % (ref 0.0–0.2)

## 2020-12-23 LAB — COMPREHENSIVE METABOLIC PANEL
ALT: 66 U/L — ABNORMAL HIGH (ref 0–44)
AST: 428 U/L — ABNORMAL HIGH (ref 15–41)
Albumin: 3.6 g/dL (ref 3.5–5.0)
Alkaline Phosphatase: 67 U/L (ref 38–126)
Anion gap: 11 (ref 5–15)
BUN: 17 mg/dL (ref 8–23)
CO2: 27 mmol/L (ref 22–32)
Calcium: 8.3 mg/dL — ABNORMAL LOW (ref 8.9–10.3)
Chloride: 97 mmol/L — ABNORMAL LOW (ref 98–111)
Creatinine, Ser: 0.85 mg/dL (ref 0.44–1.00)
GFR, Estimated: >60 mL/min (ref >60)
Glucose, Bld: 140 mg/dL — ABNORMAL HIGH (ref 70–99)
Potassium: 2.9 mmol/L — ABNORMAL LOW (ref 3.5–5.1)
Sodium: 135 mmol/L (ref 135–145)
Total Bilirubin: 0.6 mg/dL (ref 0.3–1.2)
Total Protein: 5.9 g/dL — ABNORMAL LOW (ref 6.5–8.1)

## 2020-12-23 LAB — TROPONIN I (HIGH SENSITIVITY): Troponin I (High Sensitivity): >27000 ng/L (ref <18)

## 2020-12-23 LAB — FIBRINOGEN: Fibrinogen: 357 mg/dL (ref 210–475)

## 2020-12-23 LAB — HEPARIN LEVEL (UNFRACTIONATED): Heparin Unfractionated: <0.1 IU/mL — ABNORMAL LOW (ref 0.30–0.70)

## 2020-12-23 LAB — C-REACTIVE PROTEIN: CRP: 1.5 mg/dL — ABNORMAL HIGH (ref <1.0)

## 2020-12-23 LAB — MAGNESIUM: Magnesium: 1.8 mg/dL (ref 1.7–2.4)

## 2020-12-23 LAB — FERRITIN: Ferritin: 12 ng/mL (ref 11–307)

## 2020-12-23 LAB — LACTATE DEHYDROGENASE: LDH: 918 U/L — ABNORMAL HIGH (ref 98–192)

## 2020-12-23 LAB — GLUCOSE, CAPILLARY: Glucose-Capillary: 121 mg/dL — ABNORMAL HIGH (ref 70–99)

## 2020-12-23 MED ORDER — ALUM & MAG HYDROXIDE-SIMETH 200-200-20 MG/5ML PO SUSP
30.0000 mL | Freq: Four times a day (QID) | ORAL | Status: DC | PRN
  Administered 2020-12-23: 30 mL via ORAL
  Filled 2020-12-23: qty 30

## 2020-12-23 MED ORDER — PANTOPRAZOLE SODIUM 40 MG PO TBEC
40.0000 mg | DELAYED_RELEASE_TABLET | Freq: Two times a day (BID) | ORAL | Status: DC
  Administered 2020-12-23 – 2020-12-26 (×5): 40 mg via ORAL
  Filled 2020-12-23 (×6): qty 1

## 2020-12-23 MED ORDER — POTASSIUM CHLORIDE 10 MEQ/100ML IV SOLN
10.0000 meq | INTRAVENOUS | Status: AC
  Administered 2020-12-23 (×3): 10 meq via INTRAVENOUS
  Filled 2020-12-23 (×3): qty 100

## 2020-12-23 MED ORDER — FENTANYL CITRATE (PF) 100 MCG/2ML IJ SOLN
12.5000 ug | Freq: Once | INTRAMUSCULAR | Status: AC
  Administered 2020-12-23: 12.5 ug via INTRAVENOUS
  Filled 2020-12-23: qty 2

## 2020-12-23 MED ORDER — GABAPENTIN 400 MG PO CAPS
400.0000 mg | ORAL_CAPSULE | Freq: Three times a day (TID) | ORAL | Status: DC
  Administered 2020-12-23 – 2020-12-25 (×7): 400 mg via ORAL
  Filled 2020-12-23 (×9): qty 1

## 2020-12-23 MED ORDER — LOPERAMIDE HCL 2 MG PO CAPS
2.0000 mg | ORAL_CAPSULE | ORAL | Status: DC | PRN
  Administered 2020-12-24 – 2020-12-25 (×5): 2 mg via ORAL
  Filled 2020-12-23 (×5): qty 1

## 2020-12-23 MED ORDER — HEPARIN BOLUS VIA INFUSION
2100.0000 [IU] | Freq: Once | INTRAVENOUS | Status: AC
  Administered 2020-12-23: 2100 [IU] via INTRAVENOUS
  Filled 2020-12-23: qty 2100

## 2020-12-23 MED ORDER — COLCHICINE 0.6 MG PO TABS
0.6000 mg | ORAL_TABLET | Freq: Two times a day (BID) | ORAL | Status: DC
  Administered 2020-12-23 – 2020-12-25 (×3): 0.6 mg via ORAL
  Filled 2020-12-23 (×6): qty 1

## 2020-12-23 MED ORDER — HEPARIN (PORCINE) 25000 UT/250ML-% IV SOLN
1100.0000 [IU]/h | INTRAVENOUS | Status: DC
  Administered 2020-12-23: 1100 [IU]/h via INTRAVENOUS
  Filled 2020-12-23: qty 250

## 2020-12-23 MED ORDER — SODIUM CHLORIDE 0.9 % IV SOLN
100.0000 mg | Freq: Every day | INTRAVENOUS | Status: DC
  Administered 2020-12-24 – 2020-12-26 (×3): 100 mg via INTRAVENOUS
  Filled 2020-12-23: qty 20
  Filled 2020-12-23: qty 100
  Filled 2020-12-23 (×2): qty 20

## 2020-12-23 MED ORDER — SODIUM CHLORIDE 0.9 % IV SOLN
200.0000 mg | Freq: Once | INTRAVENOUS | Status: AC
  Administered 2020-12-23: 200 mg via INTRAVENOUS
  Filled 2020-12-23: qty 40

## 2020-12-23 MED ORDER — POTASSIUM CHLORIDE CRYS ER 20 MEQ PO TBCR
40.0000 meq | EXTENDED_RELEASE_TABLET | Freq: Once | ORAL | Status: AC
  Administered 2020-12-23: 40 meq via ORAL
  Filled 2020-12-23: qty 2

## 2020-12-23 MED ORDER — LISINOPRIL 5 MG PO TABS
5.0000 mg | ORAL_TABLET | Freq: Every day | ORAL | Status: DC
  Administered 2020-12-23: 5 mg via ORAL

## 2020-12-23 MED ORDER — CHLORHEXIDINE GLUCONATE CLOTH 2 % EX PADS
6.0000 | MEDICATED_PAD | Freq: Every day | CUTANEOUS | Status: DC
  Administered 2020-12-23: 6 via TOPICAL

## 2020-12-23 MED ORDER — ENOXAPARIN SODIUM 40 MG/0.4ML ~~LOC~~ SOLN
40.0000 mg | SUBCUTANEOUS | Status: DC
  Administered 2020-12-23 – 2020-12-25 (×3): 40 mg via SUBCUTANEOUS
  Filled 2020-12-23 (×3): qty 0.4

## 2020-12-23 MED ORDER — PROMETHAZINE HCL 25 MG/ML IJ SOLN
12.5000 mg | Freq: Four times a day (QID) | INTRAMUSCULAR | Status: DC | PRN
  Administered 2020-12-23: 12.5 mg via INTRAVENOUS
  Filled 2020-12-23: qty 1

## 2020-12-23 MED ORDER — FENTANYL CITRATE (PF) 100 MCG/2ML IJ SOLN
12.5000 ug | INTRAMUSCULAR | Status: DC | PRN
  Administered 2020-12-23 (×4): 12.5 ug via INTRAVENOUS
  Filled 2020-12-23 (×4): qty 2

## 2020-12-23 MED ORDER — MAGNESIUM SULFATE 2 GM/50ML IV SOLN
2.0000 g | Freq: Once | INTRAVENOUS | Status: AC
  Administered 2020-12-23: 2 g via INTRAVENOUS
  Filled 2020-12-23: qty 50

## 2020-12-23 NOTE — Progress Notes (Signed)
[facility]    SUBJECTIVE: Patient doing reasonably well with no complaints of persistent midsternal discomfort.  Patient had difficulty sleeping requiring extra pain medications.  Denies any shortness of breath no leg swelling no palpitations or tachycardia.   Vitals:   12/23/20 0400 12/23/20 0440 12/23/20 0500 12/23/20 0600  BP: 133/66  119/70 119/68  Pulse: 65  72 64  Resp: 15  17 13   Temp:  98 F (36.7 C)    TempSrc:      SpO2: 99%  95% 96%  Weight:   75 kg   Height:         Intake/Output Summary (Last 24 hours) at 12/23/2020 0837 Last data filed at 12/23/2020 0546 Gross per 24 hour  Intake 2018.35 ml  Output 1025 ml  Net 993.35 ml      PHYSICAL EXAM  General: Well developed, well nourished, in no acute distress HEENT:  Normocephalic and atramatic Neck:  No JVD.  Lungs: Clear bilaterally to auscultation and percussion. Heart: HRRR . Normal S1 and S2 without gallops or murmurs.  Abdomen: Bowel sounds are positive, abdomen soft and non-tender  Msk:  Back normal, normal gait. Normal strength and tone for age. Extremities: No clubbing, cyanosis or edema.   Neuro: Alert and oriented X 3. Psych:  Good affect, responds appropriately   LABS: Basic Metabolic Panel: Recent Labs    12/22/20 1521 12/23/20 0023  NA 133* 135  K 3.0* 2.9*  CL 95* 97*  CO2 27 27  GLUCOSE 130* 140*  BUN 20 17  CREATININE 0.96 0.85  CALCIUM 8.7* 8.3*  MG  --  1.8   Liver Function Tests: Recent Labs    12/22/20 1521 12/23/20 0023  AST 29 428*  ALT 24 66*  ALKPHOS 66 67  BILITOT 0.7 0.6  PROT 6.1* 5.9*  ALBUMIN 3.7 3.6   No results for input(s): LIPASE, AMYLASE in the last 72 hours. CBC: Recent Labs    12/22/20 1521 12/23/20 0023  WBC 6.2 6.8  NEUTROABS 3.9  --   HGB 10.8* 11.3*  HCT 33.6* 34.0*  MCV 81.8 80.6  PLT 299 282   Cardiac Enzymes: No results for input(s): CKTOTAL, CKMB, CKMBINDEX, TROPONINI in the last 72 hours. BNP: Invalid input(s):  POCBNP D-Dimer: No results for input(s): DDIMER in the last 72 hours. Hemoglobin A1C: Recent Labs    12/22/20 1521  HGBA1C 5.7*   Fasting Lipid Panel: Recent Labs    12/22/20 1521  CHOL 191  HDL 70  LDLCALC 92  TRIG 144  CHOLHDL 2.7   Thyroid Function Tests: No results for input(s): TSH, T4TOTAL, T3FREE, THYROIDAB in the last 72 hours.  Invalid input(s): FREET3 Anemia Panel: Recent Labs    12/23/20 0023  FERRITIN 12    CARDIAC CATHETERIZATION  Result Date: 12/22/2020  Ost LAD lesion is 80% stenosed.  Prox LAD lesion is 100% stenosed.  Ost Cx to Prox Cx lesion is 25% stenosed.  Mid Cx to Dist Cx lesion is 50% stenosed.  Prox RCA to Dist RCA lesion is 25% stenosed with 25% stenosed side branch in RPAV.  Dist LAD lesion is 90% stenosed.  Moderately depressed left ventricular function between 30 to 35% with anterior apical akinesis  Conclusion STEMI presentation EKG with subtle anterior ST elevation but septal anterior Q waves Diagnostic cardiac cath showed large left main free of disease LAD 100% occluded ostially TIMI 0 flow Circumflex was large with just minor disease TIMI-3 flow RCA was medium in size, diffuse  25 to 50% Left ventricle Moderately depressed left ventricular function 30 to 35% anterior apical akinesis Intervention Successful PCI and stent of ostial proximal LAD with DES 2.5 x 34 mm resolute Onyx Postdilated with a 2.75 x 15 mm [place] to 14 atm TIMI-3 flow was restored Appeared to be residual very distal apical lesion of 90% which was not identified until flow was restored Distal LAD lesion was deferred Patient identity incidentally tested positive for Covid while on the table Minx was deployed to right groin femoral artery Patient transferred to CCU for recovery Hospitalist service  notified  DG Chest Port 1 View  Result Date: 12/22/2020 CLINICAL DATA:  Chest pain. EXAM: PORTABLE CHEST 1 VIEW COMPARISON:  None. FINDINGS: The heart size and mediastinal contours  are within normal limits. Mild patchy opacity of right lung base is noted. There is no pleural effusion or pulmonary edema. The visualized skeletal structures are unremarkable. IMPRESSION: Mild patchy opacity of right lung base, suspicious for pneumonia. Electronically Signed   By: Abelardo Diesel M.D.   On: 12/22/2020 15:48     Echo pending  TELEMETRY: Sinus rhythm 65 nonspecific T changes:  ASSESSMENT AND PLAN:  Principal Problem:   STEMI involving left anterior descending coronary artery (HCC) Active Problems:   STEMI (ST elevation myocardial infarction) ([place])   CAD (coronary artery disease)   HTN (hypertension)   COVID-19 virus infection Status post PCI and stent DES to LAD Hyperlipidemia Borderline obesity Persistent chest discomfort possibly post MI pain vs/ Dressler syndrome /vs reflux Hypokalemia  Plan Recommend EKG today post MI Echocardiogram for evaluation of possible pericarditis as well as LV function with wall motion Continue aspirin Brilinta beta-blocker ACE inhibitor statin Will increase ACE inhibitor for stricter blood pressure control and to help with remodeling Ischemic cardiomyopathy related to acute MI extremely high troponins will reassess left ventricular function and 90 days to see if there is any recovery Maintain high dose statin therapy with Lipitor 80 mg a day Increase activity out of bed to chair Patient should be arrange for cardiac rehab upon discharge and clearance from Covid Agree with Covid therapy and treatment Correct electrolytes hypokalemia evaluate for hypomagnesemia and correct as necessary   Yolonda Kida, MD 12/23/2020 8:37 AM

## 2020-12-23 NOTE — Progress Notes (Addendum)
PROGRESS NOTE    Karen Madden  GEX:[id number] DOB: Dec 12, 1946 DOA: 12/22/2020 PCP: No primary care provider on file.    Brief Narrative:  Karen Madden was admitted to the hospital with working diagnosis of ST elevation myocardial infarction in the setting of COVID-19 pneumonia.  75 year old female past medical history for hypertension, who presented with a chief complaint of worsening chest pain.  Severe symptoms on the day of admission, associated with nausea, left arm pain and diarrhea.  Apparently patient was on the toilet and felt as she was going to pass out.  EMS was called, EKG showed possible ST elevation myocardial infarction on anterior leads. She underwent emergent cardiac catheterization, angiography, PCI, drug-eluting stent placement to LAD.   Her COVID-19 screen was positive. On her initial physical examination blood pressure 87/60, heart rate 60, respirate 11, oxygen saturation 94%.  Her lungs are clear to auscultation bilaterally, heart S1-S2, present rhythmic, soft abdomen, no lower extremity edema.  Sodium 133, potassium 3.0, chloride 95, bicarb 27, glucose 130, BUN 20, creatinine 0.96, troponin I 60 -> 27,000.  White count 6.2, hemoglobin 10.8, hematocrit 33.6, platelets 299. Chest radiograph with right base small interstitial infiltrate, left base atelectasis.  EKG 58 bpm, normal axis, normal intervals, sinus rhythm, V1-V2 ST elevation, no significant T wave changes.  Assessment & Plan:   Principal Problem:   STEMI involving left anterior descending coronary artery (HCC) Active Problems:   STEMI (ST elevation myocardial infarction) (HCC)   CAD (coronary artery disease)   HTN (hypertension)   COVID-19 virus infection   Pneumonia due to COVID-19 virus   1. Acute ST elevation myocardial infarction. Patient this am is chest pain free.  SP PCI to proximal LAD.  Continue medical therapy with asa, ticagrelor, atorvastatin and metoprolol.  Added lisinopril this  am.  Follow up echocardiography, patient on colchicine per cardiology recommendations, possible pericarditis.   2. COVID 19 viral pneumonia. Chest film with right lower lobe infiltrate, no frank dyspnea or cough.   COVID-19 Labs  Recent Labs    12/23/20 0023  FERRITIN 12  LDH 918*  CRP 1.5*    Lab Results  Component Value Date   SARSCOV2NAA POSITIVE (A) 12/22/2020   Mild elevation in inflammatory markers, patient vaccinated x2 doses.   Add IV remdesivir for at least 3 doses.  2. HTN. Blood pressure this am 135/75, continue metoprolol and lisinopril.  Continue close monitoring.   3, Dyslipidemia. Continue with statin therapy.   4. Hypokalemia. K this am 2,9 with preserved renal function, cr 0,85 and bicarbonate 27. Ordered 30 meq kcl this am, will add 40 meq po this pm. Follow renal panel in am.   Patient continue to be at high risk for worsening ischemic cardiomyopathy   Status is: Inpatient  Remains inpatient appropriate because:IV treatments appropriate due to intensity of illness or inability to take PO   Dispo: The patient is from: Home              Anticipated d/c is to: Home              Anticipated d/c date is: 3 days              Patient currently is not medically stable to d/c.   DVT prophylaxis: Enoxaparin   Code Status:    full  Family Communication:  No family at the bedside      Consultants:   Cardiology   Procedures:   PCI  Subjective: Patient with no chest pain or dyspnea, positive nausea and diarrhea, no vomiting.   Objective: Vitals:   12/23/20 0440 12/23/20 0500 12/23/20 0600 12/23/20 0849  BP:  119/70 119/68 140/68  Pulse:  72 64   Resp:  17 13   Temp: 98 F (36.7 C)     TempSrc:      SpO2:  95% 96%   Weight:  75 kg    Height:        Intake/Output Summary (Last 24 hours) at 12/23/2020 0909 Last data filed at 12/23/2020 0546 Gross per 24 hour  Intake 2018.35 ml  Output 1025 ml  Net 993.35 ml   Filed Weights    12/22/20 1524 12/22/20 1730 12/23/20 0500  Weight: 78 kg 75.7 kg 75 kg    Examination:   General: Not in pain or dyspnea, deconditioned  Neurology: Awake and alert, non focal  E ENT: mild pallor, no icterus, oral mucosa moist Cardiovascular: No JVD. S1-S2 present, rhythmic, no gallops, rubs, or murmurs. No lower extremity edema. Pulmonary: positive breath sounds bilaterally,no wheezing Gastrointestinal. Abdomen soft and non tender Skin. No rashes Musculoskeletal: no joint deformities     Data Reviewed: I have personally reviewed following labs and imaging studies  CBC: Recent Labs  Lab 12/22/20 1521 12/23/20 0023  WBC 6.2 6.8  NEUTROABS 3.9  --   HGB 10.8* 11.3*  HCT 33.6* 34.0*  MCV 81.8 80.6  PLT 299 [id number]   Basic Metabolic Panel: Recent Labs  Lab 12/22/20 1521 12/23/20 0023  NA 133* 135  K 3.0* 2.9*  CL 95* 97*  CO2 27 27  GLUCOSE 130* 140*  BUN 20 17  CREATININE 0.96 0.85  CALCIUM 8.7* 8.3*  MG  --  1.8   GFR: Estimated Creatinine Clearance: 56.7 mL/min (by C-G formula based on SCr of 0.85 mg/dL). Liver Function Tests: Recent Labs  Lab 12/22/20 1521 12/23/20 0023  AST 29 428*  ALT 24 66*  ALKPHOS 66 67  BILITOT 0.7 0.6  PROT 6.1* 5.9*  ALBUMIN 3.7 3.6   No results for input(s): LIPASE, AMYLASE in the last 168 hours. No results for input(s): AMMONIA in the last 168 hours. Coagulation Profile: Recent Labs  Lab 12/22/20 1521  INR 0.9   Cardiac Enzymes: No results for input(s): CKTOTAL, CKMB, CKMBINDEX, TROPONINI in the last 168 hours. BNP (last 3 results) No results for input(s): PROBNP in the last 8760 hours. HbA1C: Recent Labs    12/22/20 1521  HGBA1C 5.7*   CBG: Recent Labs  Lab 12/22/20 1718  GLUCAP 125*   Lipid Profile: Recent Labs    12/22/20 1521  CHOL 191  HDL 70  LDLCALC 92  TRIG 144  CHOLHDL 2.7   Thyroid Function Tests: No results for input(s): TSH, T4TOTAL, FREET4, T3FREE, THYROIDAB in the last 72  hours. Anemia Panel: Recent Labs    12/23/20 0023  FERRITIN 12      Radiology Studies: I have reviewed all of the imaging during this hospital visit personally     Scheduled Meds: . aspirin  81 mg Oral Daily  . atorvastatin  80 mg Oral Daily  . Chlorhexidine Gluconate Cloth  6 each Topical Q0600  . colchicine  0.6 mg Oral BID  . lisinopril  5 mg Oral Daily  . metoprolol tartrate  12.5 mg Oral BID  . pantoprazole  40 mg Oral BID  . sodium chloride flush  3 mL Intravenous Q12H  . ticagrelor  90 mg Oral BID  Continuous Infusions: . sodium chloride Stopped (12/22/20 2118)  . sodium chloride    . magnesium sulfate bolus IVPB 2 g (12/23/20 0858)  . potassium chloride 10 mEq (12/23/20 0859)     LOS: 1 day        Delvis Kau Gerome Apley, MD

## 2020-12-23 NOTE — Progress Notes (Signed)
PHARMACY CONSULT NOTE  Pharmacy Consult for Electrolyte Monitoring and Replacement   Recent Labs: Potassium (mmol/L)  Date Value  12/23/2020 2.9 (L)   Magnesium (mg/dL)  Date Value  54/00/8676 1.8   Calcium (mg/dL)  Date Value  19/50/9326 8.3 (L)   Albumin (g/dL)  Date Value  71/24/5809 3.6   Sodium (mmol/L)  Date Value  12/23/2020 135   Corrected Ca: 8.6 mg/dL  Assessment: 75 y.o. female with medical history significant for hypertension who is admitted to [facility] on 12/22/2020 with STEMI after being taken from [facility] ED directly to cath lab after presenting  to [facility] Emergency Department complaining of chest pain.   Goal of Therapy:  Potassium 4.0 - 5.1 mmol/L Magnesium 2.0 - 2.4 mg/dL All Other Electrolytes WNL  Plan:   2 grams IV magnesium sulfate x 1  10 mEq IV KCl x 3  40 mEq oral KCl x 1 per attending MD  Re-check electrolytes in am  Lowella Bandy ,PharmD Clinical Pharmacist 12/23/2020 7:57 AM

## 2020-12-23 NOTE — Progress Notes (Signed)
Report called to [facility] on 2A, pt will transport on room air on 2A bed.  Belongings and chart to travel with her.  VSS on room air, pt  Uses 2 liters for comfort and sleep ad lib.

## 2020-12-23 NOTE — Progress Notes (Signed)
Room has not yet been cleaned by EVS.

## 2020-12-23 NOTE — Progress Notes (Incomplete)
Pt complaining of epigastric pain, no other symptoms noted, pt vitals stable, no changes in rhythm on monitor  NP notified. Maalox given. Pt's complaint Pt then complaining of mid chest pain, unable to describe and unable to rate on a numerical scale. Pt is grimacing. NP, notified and dose of fentanyl ordered,  Prn Tylenol also given per pt's request.

## 2020-12-23 NOTE — Progress Notes (Signed)
Remdesivir - Pharmacy Brief Note   O:  ALT: 66 U/L (trending up) CXR: Mild patchy opacity of right lung base, suspicious for pneumonia SpO2:  95% on 2 L    A/P:   Remdesivir 200 mg IVPB once followed by 100 mg IVPB daily x 4 days   Burnis Medin, PharmD 12/23/2020 11:22 AM

## 2020-12-23 NOTE — Progress Notes (Signed)
ANTICOAGULATION CONSULT NOTE  Pharmacy Consult for Heparin infusion Indication: chest pain/ACS  Allergies  Allergen Reactions  . Morphine And Related Anxiety    Hallucinations, nausea, vomiting, "doesn't help my pain at all"    Patient Measurements: Height: 5\' 4"  (162.6 cm) Weight: 75.7 kg (166 lb 14.2 oz) IBW/kg (Calculated) : 54.7 Heparin Dosing Weight: 71.3 kg  Vital Signs: Temp: 97.2 F (36.2 C) (01/03 0000) Temp Source: Oral (01/03 0000) BP: 115/78 (01/03 0000) Pulse Rate: 68 (01/03 0000)  Labs: Recent Labs    12/22/20 1521 12/22/20 1855 12/22/20 2134 12/23/20 0023  HGB 10.8*  --   --  11.3*  HCT 33.6*  --   --  34.0*  PLT 299  --   --  282  APTT 29  --   --   --   LABPROT 11.9  --   --   --   INR 0.9  --   --   --   HEPARINUNFRC  --   --   --  <0.10*  CREATININE 0.96  --   --   --   TROPONINIHS 60* >27,000* >27,000* >27,000*    Estimated Creatinine Clearance: 50.4 mL/min (by C-G formula based on SCr of 0.96 mg/dL).   Medical History: Past Medical History:  Diagnosis Date  . Hypertension     Assessment: 75yo female with a past medical history of hypertension who presents via EMS from a grocery store complaining of chest pain, nausea/vomiting/diarrhea, and left arm pain. EKG was transmitted by EMS with concerns for possible STEMI with elevation in anterior leads. Pharmacy has been consulted for heparin dosing and monitoring for ACE/chest pain. Baseline labs WNL. No PTA anticoagulation upon chart review.  0103 0023 HL <0.10, subtherapeutic   Goal of Therapy:  Heparin level 0.3-0.7 units/ml Monitor platelets by anticoagulation protocol: Yes   Plan:  Give 2100 units bolus x 1 In heparin infusion at 1100 units/hr Check anti-Xa level in 8 hours and daily while on heparin Continue to monitor H&H and platelets  75yo, PharmD, [facility] 12/23/2020 1:13 AM

## 2020-12-24 ENCOUNTER — Inpatient Hospital Stay
Admit: 2020-12-24 | Discharge: 2020-12-24 | Disposition: A | Discharge status: Home / Self Care | Payer: Medicare Other | Attending: Internal Medicine | Admitting: Internal Medicine

## 2020-12-24 ENCOUNTER — Inpatient Hospital Stay: Payer: Medicare Other

## 2020-12-24 LAB — ECHOCARDIOGRAM COMPLETE
AR max vel: 2.99 cm2
AV Area VTI: 2.92 cm2
AV Area mean vel: 2.7 cm2
AV Mean grad: 3 mmHg
AV Peak grad: 4.5 mmHg
Ao pk vel: 1.06 m/s
Area-P 1/2: 4.04 cm2
Calc EF: 38.2 %
Height: 64 in
S' Lateral: 2.4 cm
Single Plane A2C EF: 44.2 %
Single Plane A4C EF: 35.4 %
Weight: 2622.4 oz

## 2020-12-24 LAB — FERRITIN: Ferritin: 20 ng/mL (ref 11–307)

## 2020-12-24 LAB — TROPONIN I (HIGH SENSITIVITY): Troponin I (High Sensitivity): >27000 ng/L (ref <18)

## 2020-12-24 LAB — COMPREHENSIVE METABOLIC PANEL
ALT: 49 U/L — ABNORMAL HIGH (ref 0–44)
AST: 190 U/L — ABNORMAL HIGH (ref 15–41)
Albumin: 3.3 g/dL — ABNORMAL LOW (ref 3.5–5.0)
Alkaline Phosphatase: 56 U/L (ref 38–126)
Anion gap: 10 (ref 5–15)
BUN: 13 mg/dL (ref 8–23)
CO2: 25 mmol/L (ref 22–32)
Calcium: 8 mg/dL — ABNORMAL LOW (ref 8.9–10.3)
Chloride: 98 mmol/L (ref 98–111)
Creatinine, Ser: 0.7 mg/dL (ref 0.44–1.00)
GFR, Estimated: >60 mL/min (ref >60)
Glucose, Bld: 118 mg/dL — ABNORMAL HIGH (ref 70–99)
Potassium: 3.1 mmol/L — ABNORMAL LOW (ref 3.5–5.1)
Sodium: 133 mmol/L — ABNORMAL LOW (ref 135–145)
Total Bilirubin: 0.8 mg/dL (ref 0.3–1.2)
Total Protein: 5.8 g/dL — ABNORMAL LOW (ref 6.5–8.1)

## 2020-12-24 LAB — C-REACTIVE PROTEIN: CRP: 3.1 mg/dL — ABNORMAL HIGH (ref <1.0)

## 2020-12-24 LAB — D-DIMER, QUANTITATIVE: D-Dimer, Quant: 0.54 ug/mL-FEU — ABNORMAL HIGH (ref 0.00–0.50)

## 2020-12-24 LAB — MAGNESIUM: Magnesium: 2.1 mg/dL (ref 1.7–2.4)

## 2020-12-24 MED ORDER — LISINOPRIL 10 MG PO TABS
10.0000 mg | ORAL_TABLET | Freq: Every day | ORAL | Status: DC
  Administered 2020-12-24 – 2020-12-26 (×3): 10 mg via ORAL
  Filled 2020-12-24 (×3): qty 1

## 2020-12-24 MED ORDER — HYDROCOD POLST-CPM POLST ER 10-8 MG/5ML PO SUER
5.0000 mL | Freq: Two times a day (BID) | ORAL | Status: DC
  Administered 2020-12-24 – 2020-12-25 (×3): 5 mL via ORAL
  Filled 2020-12-24 (×5): qty 5

## 2020-12-24 MED ORDER — POTASSIUM CHLORIDE CRYS ER 20 MEQ PO TBCR
40.0000 meq | EXTENDED_RELEASE_TABLET | ORAL | Status: DC
Start: 2020-12-24 — End: 2020-12-24
  Administered 2020-12-24: 40 meq via ORAL
  Filled 2020-12-24: qty 2

## 2020-12-24 MED ORDER — SPIRONOLACTONE 25 MG PO TABS
25.0000 mg | ORAL_TABLET | Freq: Every day | ORAL | Status: DC
  Administered 2020-12-24 – 2020-12-26 (×3): 25 mg via ORAL
  Filled 2020-12-24 (×3): qty 1

## 2020-12-24 MED ORDER — FUROSEMIDE 40 MG PO TABS
40.0000 mg | ORAL_TABLET | Freq: Every day | ORAL | Status: DC
  Administered 2020-12-25 – 2020-12-26 (×2): 40 mg via ORAL
  Filled 2020-12-24 (×2): qty 1

## 2020-12-24 MED ORDER — LEVOTHYROXINE SODIUM 88 MCG PO TABS
88.0000 ug | ORAL_TABLET | Freq: Every day | ORAL | Status: DC
  Administered 2020-12-24 – 2020-12-26 (×3): 88 ug via ORAL
  Filled 2020-12-24 (×3): qty 1

## 2020-12-24 MED ORDER — DIPHENHYDRAMINE HCL 25 MG PO CAPS: 25.0000 mg | ORAL_CAPSULE | Freq: Every evening | ORAL | Status: DC | PRN

## 2020-12-24 MED ORDER — EZETIMIBE 10 MG PO TABS
10.0000 mg | ORAL_TABLET | Freq: Every day | ORAL | Status: DC
  Administered 2020-12-24 – 2020-12-26 (×3): 10 mg via ORAL
  Filled 2020-12-24 (×3): qty 1

## 2020-12-24 MED ORDER — DIPHENHYDRAMINE HCL (SLEEP) 25 MG PO TABS: 25.0000 mg | ORAL_TABLET | Freq: Every evening | ORAL | Status: DC | PRN

## 2020-12-24 MED ORDER — POTASSIUM CHLORIDE CRYS ER 20 MEQ PO TBCR: 40.0000 meq | EXTENDED_RELEASE_TABLET | ORAL | Status: DC

## 2020-12-24 MED ORDER — IPRATROPIUM-ALBUTEROL 20-100 MCG/ACT IN AERS
1.0000 | INHALATION_SPRAY | Freq: Four times a day (QID) | RESPIRATORY_TRACT | Status: DC
  Administered 2020-12-24 – 2020-12-26 (×7): 1 via RESPIRATORY_TRACT
  Filled 2020-12-24: qty 4

## 2020-12-24 MED ORDER — BUPROPION HCL ER (XL) 150 MG PO TB24
150.0000 mg | ORAL_TABLET | Freq: Every day | ORAL | Status: DC
  Administered 2020-12-24 – 2020-12-26 (×3): 150 mg via ORAL
  Filled 2020-12-24 (×3): qty 1

## 2020-12-24 MED ORDER — BUPROPION HCL ER (SMOKING DET) 150 MG PO TB12: 150.0000 mg | ORAL_TABLET | Freq: Two times a day (BID) | ORAL | Status: DC

## 2020-12-24 MED ORDER — FUROSEMIDE 10 MG/ML IJ SOLN
40.0000 mg | Freq: Once | INTRAMUSCULAR | Status: AC
  Administered 2020-12-24: 40 mg via INTRAVENOUS
  Filled 2020-12-24: qty 4

## 2020-12-24 MED ORDER — METOPROLOL TARTRATE 25 MG PO TABS
25.0000 mg | ORAL_TABLET | Freq: Two times a day (BID) | ORAL | Status: DC
  Administered 2020-12-24 – 2020-12-25 (×3): 25 mg via ORAL
  Filled 2020-12-24 (×2): qty 1

## 2020-12-24 MED ORDER — ALBUTEROL SULFATE HFA 108 (90 BASE) MCG/ACT IN AERS
2.0000 | INHALATION_SPRAY | RESPIRATORY_TRACT | Status: DC | PRN
  Filled 2020-12-24: qty 6.7

## 2020-12-24 MED ORDER — POTASSIUM CHLORIDE CRYS ER 20 MEQ PO TBCR
40.0000 meq | EXTENDED_RELEASE_TABLET | Freq: Once | ORAL | Status: AC
  Administered 2020-12-24: 40 meq via ORAL
  Filled 2020-12-24: qty 2

## 2020-12-24 MED ORDER — GUAIFENESIN-DM 100-10 MG/5ML PO SYRP
5.0000 mL | ORAL_SOLUTION | ORAL | Status: DC | PRN
  Filled 2020-12-24: qty 5

## 2020-12-24 MED ORDER — VITAMIN D 25 MCG (1000 UNIT) PO TABS
1000.0000 [IU] | ORAL_TABLET | Freq: Every day | ORAL | Status: DC
  Administered 2020-12-24 – 2020-12-26 (×3): 1000 [IU] via ORAL
  Filled 2020-12-24 (×3): qty 1

## 2020-12-24 MED ORDER — FAMOTIDINE 20 MG PO TABS
10.0000 mg | ORAL_TABLET | Freq: Two times a day (BID) | ORAL | Status: DC
Start: 2020-12-24 — End: 2020-12-26
  Administered 2020-12-24 – 2020-12-26 (×5): 10 mg via ORAL
  Filled 2020-12-24 (×5): qty 1

## 2020-12-24 NOTE — Progress Notes (Signed)
*  PRELIMINARY RESULTS* Echocardiogram 2D Echocardiogram has been performed.  Karen Madden 12/24/2020, 9:34 AM

## 2020-12-24 NOTE — Plan of Care (Signed)
  Problem: Clinical Measurements: Goal: Cardiovascular complication will be avoided Outcome: Progressing   Problem: Activity: Goal: Risk for activity intolerance will decrease Outcome: Progressing   Problem: Pain Managment: Goal: General experience of comfort will improve Outcome: Progressing   Problem: Safety: Goal: Ability to remain free from injury will improve Outcome: Progressing   

## 2020-12-24 NOTE — Progress Notes (Signed)
PHARMACY CONSULT NOTE  Pharmacy Consult for Electrolyte Monitoring and Replacement   Recent Labs: Potassium (mmol/L)  Date Value  12/24/2020 3.1 (L)   Magnesium (mg/dL)  Date Value  11/91/4782 2.1   Calcium (mg/dL)  Date Value  95/62/1308 8.0 (L)   Albumin (g/dL)  Date Value  65/78/4696 3.3 (L)   Sodium (mmol/L)  Date Value  12/24/2020 133 (L)   Corrected Ca: 8.6 mg/dL  Assessment: 75 y.o. female with medical history significant for hypertension who is admitted to [facility] on 12/22/2020 with STEMI after being taken from [facility] ED directly to cath lab after presenting  to [facility] Emergency Department complaining of chest pain.   Goal of Therapy:  Potassium 4.0 - 5.1 mmol/L Magnesium 2.0 - 2.4 mg/dL All Other Electrolytes WNL  Plan:   40 mEq oral KCl x 2 per attending MD  Re-check electrolytes in am  Ronnald Ramp ,PharmD Clinical Pharmacist 12/24/2020 7:16 AM

## 2020-12-24 NOTE — Evaluation (Signed)
Physical Therapy Evaluation Patient Details Name: Karen Madden MRN: [id number] DOB: [birth year] Today's Date: 12/24/2020   History of Present Illness  75 y.o. female with medical history significant for hypertension who is admitted to [facility] on 12/22/2020 with STEMI after being taken from [facility] ED directly to cath lab after presenting  to [facility] Emergency Department complaining of chest pain. Pt is positive for covid.  Clinical Impression  Pt did well with PT exam and overall showed good safety and confidence with prolonged standing activity/balance and though she did have some fatigue with this on room air her sats stayed in the 90s w/o shortness of breath or other overt symptoms.       Follow Up Recommendations Home health PT (per progress, may not need)    Equipment Recommendations  None recommended by PT    Recommendations for Other Services       Precautions / Restrictions Precautions Precautions: Fall Restrictions Weight Bearing Restrictions: No      Mobility  Bed Mobility Overal bed mobility: Modified Independent Bed Mobility: Supine to Sit;Sit to Supine Rolling: Supervision Sidelying to sit: Supervision Supine to sit: Supervision Sit to supine: Supervision   General bed mobility comments: gets to from sitting/supine w/o issue or hesitation    Transfers Overall transfer level: Modified independent Equipment used: None Transfers: Sit to/from Stand Sit to Stand: Min guard Stand pivot transfers: Min assist       General transfer comment: Pt showed good confidence and safety with transition to standing w/o AD  Ambulation/Gait Ambulation/Gait assistance: Supervision Gait Distance (Feet): 100 Feet Assistive device: None       General Gait Details: ambulation in room (multiple loops/turns) with O2 remaining in the high 90s on room air, no LOBs or safety issues  Stairs            Wheelchair Mobility    Modified Rankin (Stroke  Patients Only)       Balance Overall balance assessment: Modified Independent Sitting-balance support: Feet supported Sitting balance-Leahy Scale: Good Sitting balance - Comments: no LOB     Standing balance-Leahy Scale: Good Standing balance comment: able to participate in prolonged standing activities w/o issue or overt safty concerns                             Pertinent Vitals/Pain Pain Assessment: No/denies pain    Home Living Family/patient expects to be discharged to:: Private residence Living Arrangements: Spouse/significant other Available Help at Discharge: Family;Available 24 hours/day Type of Home: House Home Access: Stairs to enter Entrance Stairs-Rails: None Entrance Stairs-Number of Steps: 1 Home Layout: One level Home Equipment: Shower seat      Prior Function Level of Independence: Independent         Comments: able to be fully active with community errands, etc     Hand Dominance   Dominant Hand: Right    Extremity/Trunk Assessment   Upper Extremity Assessment Upper Extremity Assessment: Overall WFL for tasks assessed;Generalized weakness    Lower Extremity Assessment Lower Extremity Assessment: Overall WFL for tasks assessed    Cervical / Trunk Assessment Cervical / Trunk Assessment: Normal  Communication   Communication: No difficulties  Cognition Arousal/Alertness: Awake/alert Behavior During Therapy: WFL for tasks assessed/performed Overall Cognitive Status: Within Functional Limits for tasks assessed  General Comments      Exercises     Assessment/Plan    PT Assessment Patient needs continued PT services  PT Problem List Decreased activity tolerance;Decreased strength;Decreased balance;Decreased safety awareness;Cardiopulmonary status limiting activity       PT Treatment Interventions Gait training;Stair training;Functional mobility training;Therapeutic  activities;Therapeutic exercise;Balance training;Neuromuscular re-education;Patient/family education    PT Goals (Current goals can be found in the Care Plan section)  Acute Rehab PT Goals Patient Stated Goal: to go home PT Goal Formulation: With patient Time For Goal Achievement: 01/07/21 Potential to Achieve Goals: Good    Frequency Min 2X/week   Barriers to discharge        Co-evaluation               AM-PAC PT "6 Clicks" Mobility  Outcome Measure Help needed turning from your back to your side while in a flat bed without using bedrails?: None Help needed moving from lying on your back to sitting on the side of a flat bed without using bedrails?: None Help needed moving to and from a bed to a chair (including a wheelchair)?: None Help needed standing up from a chair using your arms (e.g., wheelchair or bedside chair)?: None Help needed to walk in hospital room?: None Help needed climbing 3-5 steps with a railing? : A Little 6 Click Score: 23    End of Session Equipment Utilized During Treatment: Gait belt Activity Tolerance: Patient tolerated treatment well Patient left: in bed;with call bell/phone within reach Nurse Communication: Mobility status PT Visit Diagnosis: Muscle weakness (generalized) (M62.81)    Time: [id number] PT Time Calculation (min) (ACUTE ONLY): 22 min   Charges:   PT Evaluation $PT Eval Low Complexity: 1 Low          Kreg Shropshire, DPT 12/24/2020, 6:20 PM

## 2020-12-24 NOTE — Progress Notes (Signed)
[facility] Encounter Note  Patient: Karen Madden / Admit Date: 12/22/2020 / Date of Encounter: 12/24/2020, 8:54 AM   Subjective: Slow recovery from anterior ST elevation myocardial infarction with troponin level greater than 27,000.  Patient getting her echocardiogram now and will await evaluation for further medication management but likely will continue aggressive medication management for significant LV systolic dysfunction including below.  Patient is not having any significant worsening symptoms from Covid with some moderate shortness of breath.  No evidence of congestive heart failure at this time.  Review of Systems: Positive for: None Negative for: Vision change, hearing change, syncope, dizziness, nausea, vomiting,diarrhea, bloody stool, stomach pain, cough, congestion, diaphoresis, urinary frequency, urinary pain,skin lesions, skin rashes Others previously listed  Objective: Telemetry: Normal sinus rhythm Physical Exam: Blood pressure 133/72, pulse 79, temperature 98.5 F (36.9 C), temperature source Oral, resp. rate (!) 22, height 5' 4"  (1.626 m), weight 74.3 kg, SpO2 94 %. Body mass index is 28.13 kg/m.    Intake/Output Summary (Last 24 hours) at 12/24/2020 0854 Last data filed at 12/24/2020 0550 Gross per 24 hour  Intake 1044.28 ml  Output 1200 ml  Net -155.72 ml    Inpatient Medications:  . aspirin  81 mg Oral Daily  . atorvastatin  80 mg Oral Daily  . buPROPion  150 mg Oral Daily  . cholecalciferol  1,000 Units Oral Daily  . colchicine  0.6 mg Oral BID  . enoxaparin (LOVENOX) injection  40 mg Subcutaneous Q24H  . ezetimibe  10 mg Oral Daily  . famotidine  10 mg Oral BID  . gabapentin  400 mg Oral TID  . levothyroxine  88 mcg Oral Q0600  . lisinopril  5 mg Oral Daily  . metoprolol tartrate  12.5 mg Oral BID  . pantoprazole  40 mg Oral BID  . potassium chloride  40 mEq Oral Q4H  . sodium chloride flush  3 mL Intravenous Q12H  . ticagrelor   90 mg Oral BID   Infusions:  . sodium chloride Stopped (12/23/20 1731)  . sodium chloride    . remdesivir 100 mg in NS 100 mL      Labs: Recent Labs    12/23/20 0023 12/24/20 0456  NA 135 133*  K 2.9* 3.1*  CL 97* 98  CO2 27 25  GLUCOSE 140* 118*  BUN 17 13  CREATININE 0.85 0.70  CALCIUM 8.3* 8.0*  MG 1.8 2.1   Recent Labs    12/23/20 0023 12/24/20 0456  AST 428* 190*  ALT 66* 49*  ALKPHOS 67 56  BILITOT 0.6 0.8  PROT 5.9* 5.8*  ALBUMIN 3.6 3.3*   Recent Labs    12/22/20 1521 12/23/20 0023  WBC 6.2 6.8  NEUTROABS 3.9  --   HGB 10.8* 11.3*  HCT 33.6* 34.0*  MCV 81.8 80.6  PLT 299 282   No results for input(s): CKTOTAL, CKMB, TROPONINI in the last 72 hours. Invalid input(s): POCBNP Recent Labs    12/22/20 1521  HGBA1C 5.7*     Weights: Filed Weights   12/22/20 1730 12/23/20 0500 12/24/20 0053  Weight: 75.7 kg 75 kg 74.3 kg     Radiology/Studies:  CARDIAC CATHETERIZATION  Addendum Date: 12/23/2020    Ost LAD lesion is 80% stenosed.  Prox LAD lesion is 100% stenosed.  Ost Cx to Prox Cx lesion is 25% stenosed.  Mid Cx to Dist Cx lesion is 50% stenosed.  Prox RCA to Dist RCA lesion is 25% stenosed with  25% stenosed side branch in RPAV.  Dist LAD lesion is 90% stenosed.  Moderately depressed left ventricular function between 30 to 35% with anterior apical akinesis  A drug-eluting stent was successfully placed using a STENT RESOLUTE ONYX 2.5X38.  Post intervention, there is a 0% residual stenosis.  Post intervention, there is a 0% residual stenosis.  Conclusion STEMI presentation EKG with subtle anterior ST elevation but septal anterior Q waves Diagnostic cardiac cath showed large left main free of disease LAD 100% occluded ostially TIMI 0 flow Circumflex was large with just minor disease TIMI-3 flow RCA was medium in size, diffuse 25 to 50% Left ventricle Moderately depressed left ventricular function 30 to 35% anterior apical akinesis Intervention  Successful PCI and stent of ostial proximal LAD with DES 2.5 x 34 mm resolute Onyx Postdilated with a 2.75 x 15 mm [place] trek to 14 atm TIMI-3 flow was restored Appeared to be residual very distal apical lesion of 90% which was not identified until flow was restored Distal LAD lesion was deferred Patient identity incidentally tested positive for Covid while on the table Minx was deployed to right groin femoral artery Patient transferred to CCU for recovery Hospitalist service  notified   Result Date: 12/22/2020  Ost LAD lesion is 80% stenosed.  Prox LAD lesion is 100% stenosed.  Ost Cx to Prox Cx lesion is 25% stenosed.  Mid Cx to Dist Cx lesion is 50% stenosed.  Prox RCA to Dist RCA lesion is 25% stenosed with 25% stenosed side branch in RPAV.  Dist LAD lesion is 90% stenosed.  Moderately depressed left ventricular function between 30 to 35% with anterior apical akinesis  Conclusion STEMI presentation EKG with subtle anterior ST elevation but septal anterior Q waves Diagnostic cardiac cath showed large left main free of disease LAD 100% occluded ostially TIMI 0 flow Circumflex was large with just minor disease TIMI-3 flow RCA was medium in size, diffuse 25 to 50% Left ventricle Moderately depressed left ventricular function 30 to 35% anterior apical akinesis Intervention Successful PCI and stent of ostial proximal LAD with DES 2.5 x 34 mm resolute Onyx Postdilated with a 2.75 x 15 mm [place] trek to 14 atm TIMI-3 flow was restored Appeared to be residual very distal apical lesion of 90% which was not identified until flow was restored Distal LAD lesion was deferred Patient identity incidentally tested positive for Covid while on the table Minx was deployed to right groin femoral artery Patient transferred to CCU for recovery Hospitalist service  notified  DG Chest Port 1 View  Result Date: 12/22/2020 CLINICAL DATA:  Chest pain. EXAM: PORTABLE CHEST 1 VIEW COMPARISON:  None. FINDINGS: The heart size and  mediastinal contours are within normal limits. Mild patchy opacity of right lung base is noted. There is no pleural effusion or pulmonary edema. The visualized skeletal structures are unremarkable. IMPRESSION: Mild patchy opacity of right lung base, suspicious for pneumonia. Electronically Signed   By: Abelardo Diesel M.D.   On: 12/22/2020 15:48     Assessment and Recommendation  75 y.o. female with hypertension hyperlipidemia having acute anterior ST elevation myocardial infarction status post PCI and stent placement with additional Covid infection now slowly improving with increasing medication management and tolerating well 1.  Continue beta-blocker and metoprolol at this time and slowly increasing dosage as blood pressure will allow increasing to double the dose today 2.  Addition of spironolactone 25 mg each day for acute anterior ST elevation myocardial infarction and LV systolic dysfunction  3.  High intensity cholesterol therapy with combination of atorvastatin Zetia without change today 4.  No change in dual antiplatelet therapy tolerating well 5.  Begin cardiac rehabilitation and rehabilitation from Covid infection without restriction  Signed, Serafina Royals M.D. FACC

## 2020-12-24 NOTE — Progress Notes (Signed)
PROGRESS NOTE    JADAE FEDERLE  [id number] DOB: 09-19-1946 DOA: 12/22/2020 PCP: Pcp, No    Brief Narrative:  Mrs. Coston was admitted to the hospital with working diagnosis of ST elevation myocardial infarction in the setting of COVID-19 pneumonia.  75 year old female past medical history for hypertension, who presented with a chief complaint of worsening chest pain.  Severe symptoms on the day of admission, associated with nausea, left arm pain and diarrhea.  Apparently patient was on the toilet and felt as she was going to pass out.  EMS was called, EKG showed possible ST elevation myocardial infarction on anterior leads. She underwent emergent cardiac catheterization, angiography, PCI, drug-eluting stent placement to LAD.   Her COVID-19 screen was positive. On her initial physical examination blood pressure 87/60, heart rate 60, respirate 11, oxygen saturation 94%.  Her lungs are clear to auscultation bilaterally, heart S1-S2, present rhythmic, soft abdomen, no lower extremity edema.  Sodium 133, potassium 3.0, chloride 95, bicarb 27, glucose 130, BUN 20, creatinine 0.96, troponin I 60 -> 27,000.  White count 6.2, hemoglobin 10.8, hematocrit 33.6, platelets 299. Chest radiograph with right base small interstitial infiltrate, left base atelectasis.  EKG 58 bpm, normal axis, normal intervals, sinus rhythm, V1-V2 ST elevation, no significant T wave changes.   Assessment & Plan:   Principal Problem:   STEMI involving left anterior descending coronary artery (HCC) Active Problems:   STEMI (ST elevation myocardial infarction) ([place])   CAD (coronary artery disease)   HTN (hypertension)   COVID-19 virus infection   Pneumonia due to COVID-19 virus   1. Acute ST elevation myocardial infarction/ new ischemic cardiomyopathy and decompensated systolic heart failure.  SP PCI to proximal LAD.  Medical therapy with asa, ticagrelor, atorvastatin, metoprolol and lisinopril.  Added  spironolactone.  Echocardiogram with reduction in LV EF down to 30 to 35%, positive regional wall motion abnormalities, no significant valvular disease.   Patient with worsening respiratory symptoms this am, follow up chest film personally reviewed with bilateral worsening interstitial infiltrates consistent with acute pulmonary edema.  Add furosemide 40 mg IV x1 and follow up on urine output.  On colchicine per cardiology recommendations, for possible pericarditis.   2. COVID 19 viral pneumonia with new acute hypoxemic respiratory failure.   COVID-19 Labs  Recent Labs    12/23/20 0023 12/24/20 0456  DDIMER  --  0.54*  FERRITIN 12 20  LDH 918*  --   CRP 1.5* 3.1*    Lab Results  Component Value Date   SARSCOV2NAA POSITIVE (A) 12/22/2020    Elevated inflammatory markers, and worsening oxygen requirements. Continue with remdesivir, and add furosemide IV, target negative fluid balance.  Add bronchodilator, antitussive agents and airway clearing techniques.  Out of bed to chair, PT and OT.  Hold for now on systemic steroids in the setting of acute coronary syndrome.  Follow inflammatory markers and oxygenation.   2. HTN. Continue blood pressure control with metoprolol and lisinopril.    3, Dyslipidemia. On statin therapy.   4. Hypokalemia. Continue K correction with Kcl 80 meq in 2 divided doses, follow up on electrolytes in am. Renal function continue to be stable with serum cr at 0,70 and bicarbonate at 25.   5. Hypothyroid. Continue with levothyroxine.   Patient continue to be at high risk for worsening respiratory failure   Status is: Inpatient  Remains inpatient appropriate because:IV treatments appropriate due to intensity of illness or inability to take PO   Dispo: The patient is  from: Home              Anticipated d/c is to: Home              Anticipated d/c date is: 3 days              Patient currently is not medically stable to d/c.   DVT  prophylaxis: Enoxaparin   Code Status:   full  Family Communication:  No family at the bedside     Consultants:   Cardiology   Procedures:   PCI      Subjective: Patient with worsening respiratory symptoms this am. Dyspnea and cough, no nausea or vomiting, chest tightness but not chest pain.,   Objective: Vitals:   12/24/20 0000 12/24/20 0014 12/24/20 0053 12/24/20 0504  BP:  130/64  124/61  Pulse:    82  Resp:    18  Temp:  98.2 F (36.8 C)  98.7 F (37.1 C)  TempSrc:  Oral  Axillary  SpO2: 91% 92%  91%  Weight:   74.3 kg   Height:        Intake/Output Summary (Last 24 hours) at 12/24/2020 0713 Last data filed at 12/24/2020 0550 Gross per 24 hour  Intake 1055.3 ml  Output 1200 ml  Net -144.7 ml   Filed Weights   12/22/20 1730 12/23/20 0500 12/24/20 0053  Weight: 75.7 kg 75 kg 74.3 kg    Examination:   General: Not in pain or dyspnea, deconditioned  Neurology: Awake and alert, non focal  E ENT: no pallor, no icterus, oral mucosa moist Cardiovascular: No JVD. S1-S2 present, rhythmic, no gallops, rubs, or murmurs. No lower extremity edema. Pulmonary: positive breath sounds bilaterally, with no wheezing,  Gastrointestinal. Abdomen soft and non tender Skin. No rashes Musculoskeletal: no joint deformities     Data Reviewed: I have personally reviewed following labs and imaging studies  CBC: Recent Labs  Lab 12/22/20 1521 12/23/20 0023  WBC 6.2 6.8  NEUTROABS 3.9  --   HGB 10.8* 11.3*  HCT 33.6* 34.0*  MCV 81.8 80.6  PLT 299 282   Basic Metabolic Panel: Recent Labs  Lab 12/22/20 1521 12/23/20 0023 12/24/20 0456  NA 133* 135 133*  K 3.0* 2.9* 3.1*  CL 95* 97* 98  CO2 27 27 25   GLUCOSE 130* 140* 118*  BUN 20 17 13   CREATININE 0.96 0.85 0.70  CALCIUM 8.7* 8.3* 8.0*  MG  --  1.8 2.1   GFR: Estimated Creatinine Clearance: 60 mL/min (by C-G formula based on SCr of 0.7 mg/dL). Liver Function Tests: Recent Labs  Lab 12/22/20 1521  12/23/20 0023 12/24/20 0456  AST 29 428* 190*  ALT 24 66* 49*  ALKPHOS 66 67 56  BILITOT 0.7 0.6 0.8  PROT 6.1* 5.9* 5.8*  ALBUMIN 3.7 3.6 3.3*   No results for input(s): LIPASE, AMYLASE in the last 168 hours. No results for input(s): AMMONIA in the last 168 hours. Coagulation Profile: Recent Labs  Lab 12/22/20 1521  INR 0.9   Cardiac Enzymes: No results for input(s): CKTOTAL, CKMB, CKMBINDEX, TROPONINI in the last 168 hours. BNP (last 3 results) No results for input(s): PROBNP in the last 8760 hours. HbA1C: Recent Labs    12/22/20 1521  HGBA1C 5.7*   CBG: Recent Labs  Lab 12/22/20 1718 12/23/20 1947  GLUCAP 125* 121*   Lipid Profile: Recent Labs    12/22/20 1521  CHOL 191  HDL 70  LDLCALC 92  TRIG 144  CHOLHDL 2.7   Thyroid Function Tests: No results for input(s): TSH, T4TOTAL, FREET4, T3FREE, THYROIDAB in the last 72 hours. Anemia Panel: Recent Labs    12/23/20 0023 12/24/20 0456  FERRITIN 12 20      Radiology Studies: I have reviewed all of the imaging during this hospital visit personally     Scheduled Meds: . aspirin  81 mg Oral Daily  . atorvastatin  80 mg Oral Daily  . buPROPion  150 mg Oral Daily  . buPROPion  150 mg Oral BID  . cholecalciferol  1,000 Units Oral Daily  . colchicine  0.6 mg Oral BID  . enoxaparin (LOVENOX) injection  40 mg Subcutaneous Q24H  . ezetimibe  10 mg Oral Daily  . famotidine  10 mg Oral BID  . gabapentin  400 mg Oral TID  . levothyroxine  88 mcg Oral QAC breakfast  . lisinopril  5 mg Oral Daily  . metoprolol tartrate  12.5 mg Oral BID  . pantoprazole  40 mg Oral BID  . sodium chloride flush  3 mL Intravenous Q12H  . ticagrelor  90 mg Oral BID   Continuous Infusions: . sodium chloride Stopped (12/23/20 1731)  . sodium chloride    . remdesivir 100 mg in NS 100 mL       LOS: 2 days        Mauricio Gerome Apley, MD

## 2020-12-24 NOTE — Evaluation (Signed)
Occupational Therapy Evaluation Patient Details Name: Karen Madden MRN: [id number] DOB: 24-Jun-1946 Today's Date: 12/24/2020    History of Present Illness 75 y.o. female with medical history significant for hypertension who is admitted to [facility] on 12/22/2020 with STEMI after being taken from [facility] ED directly to cath lab after presenting  to [facility] Emergency Department complaining of chest pain. Pt is positive for covid.   Clinical Impression   Patient presenting with decreased I in self care, balance, functional mobility/transfers, endurance, and safety awareness. Patient reports living with husband at home independently PTA. Pt on 4L O2 via [place] this session and OT removed once entering the room. Pt able to maintain O2 saturation at 90% or higher throughout while ambulating and performing self care tasks as needed. Pt needing HHA or holding onto IV pole with min guard for safety with  Mobility. Pt reports feeling weak since being in the bed. Pt standing with min guard progressing to close supervision for self care tasks. Patient currently functioning at min guard - min A. OT notified RN of Pt saturation on RA and she confirmed to leave on RA with pt on telemetry at this time for monitoring. Patient will benefit from acute OT to increase overall independence in the areas of ADLs, functional mobility, and safety awareness in order to safely discharge home with home health OT recommendation.    Follow Up Recommendations  Home health OT;Supervision - Intermittent    Equipment Recommendations  Other (comment) (RW)       Precautions / Restrictions Precautions Precautions: Fall      Mobility Bed Mobility Overal bed mobility: Needs Assistance Bed Mobility: Rolling;Sidelying to Sit;Supine to Sit Rolling: Supervision Sidelying to sit: Supervision Supine to sit: Supervision     General bed mobility comments: min cuing for technique    Transfers Overall transfer level:  Needs assistance Equipment used: 1 person hand held assist Transfers: Sit to/from Stand;Stand Pivot Transfers Sit to Stand: Min guard Stand pivot transfers: Min assist            Balance Overall balance assessment: Needs assistance Sitting-balance support: Feet supported Sitting balance-Leahy Scale: Good Sitting balance - Comments: no LOB     Standing balance-Leahy Scale: Fair Standing balance comment: UE support                           ADL either performed or assessed with clinical judgement   ADL Overall ADL's : Needs assistance/impaired Eating/Feeding: Independent   Grooming: Wash/dry hands;Oral care;Brushing hair;Standing;Supervision/safety;Min guard Grooming Details (indicate cue type and reason): min guard progressing to supervision while standing at sink for grooming tasks                 Toilet Transfer: Minimal assistance;Grab bars Toilet Transfer Details (indicate cue type and reason): min lifting assistance Toileting- Clothing Manipulation and Hygiene: Minimal assistance;Sit to/from stand Toileting - Clothing Manipulation Details (indicate cue type and reason): for balance with hygiene and clothing management             Vision Patient Visual Report: No change from baseline              Pertinent Vitals/Pain Pain Assessment: No/denies pain     Hand Dominance Right   Extremity/Trunk Assessment Upper Extremity Assessment Upper Extremity Assessment: Overall WFL for tasks assessed;Generalized weakness   Lower Extremity Assessment Lower Extremity Assessment: Defer to PT evaluation   Cervical / Trunk Assessment Cervical /  Trunk Assessment: Normal   Communication Communication Communication: No difficulties   Cognition Arousal/Alertness: Awake/alert Behavior During Therapy: WFL for tasks assessed/performed Overall Cognitive Status: Within Functional Limits for tasks assessed                                                 Home Living Family/patient expects to be discharged to:: Private residence Living Arrangements: Spouse/significant other Available Help at Discharge: Family;Available 24 hours/day Type of Home: House Home Access: Stairs to enter [employer] of Steps: 1 Entrance Stairs-Rails: None Home Layout: One level     Bathroom Shower/Tub: Producer, television/film/video: Standard     Home Equipment: Shower seat          Prior Functioning/Environment Level of Independence: Independent                 OT Problem List: Decreased strength;Cardiopulmonary status limiting activity;Decreased activity tolerance;Decreased safety awareness;Impaired balance (sitting and/or standing);Decreased knowledge of precautions;Decreased knowledge of use of DME or AE      OT Treatment/Interventions: Self-care/ADL training;Balance training;Therapeutic exercise;Therapeutic activities;Energy conservation;DME and/or AE instruction;Patient/family education    OT Goals(Current goals can be found in the care plan section) Acute Rehab OT Goals Patient Stated Goal: to go home OT Goal Formulation: With patient/family Time For Goal Achievement: 01/07/21 Potential to Achieve Goals: Good ADL Goals Pt Will Perform Grooming: with modified independence;standing Pt Will Transfer to Toilet: with modified independence;ambulating Pt Will Perform Toileting - Clothing Manipulation and hygiene: with modified independence;sit to/from stand Pt Will Perform Tub/Shower Transfer: with supervision;ambulating;shower seat  OT Frequency: Min 2X/week   Barriers to D/C:    none known at this time          AM-PAC OT "6 Clicks" Daily Activity     Outcome Measure Help from another person eating meals?: None Help from another person taking care of personal grooming?: A Little Help from another person toileting, which includes using toliet, bedpan, or urinal?: A Little Help from another person bathing  (including washing, rinsing, drying)?: A Little Help from another person to put on and taking off regular upper body clothing?: None Help from another person to put on and taking off regular lower body clothing?: A Little 6 Click Score: 20   End of Session Nurse Communication: Mobility status;Other (comment) (O2 saturation on RA)  Activity Tolerance: Patient tolerated treatment well Patient left: in bed;with family/visitor present;with call bell/phone within reach;with bed alarm set  OT Visit Diagnosis: Unsteadiness on feet (R26.81);Muscle weakness (generalized) (M62.81)                Time: 2633-3545 OT Time Calculation (min): 32 min Charges:  OT General Charges $OT Visit: 1 Visit OT Evaluation $OT Eval Low Complexity: 1 Low OT Treatments $Self Care/Home Management : 23-37 mins  Jackquline Denmark, MS, OTR/L , CBIS ascom [phone]  12/24/20, 3:13 PM

## 2020-12-25 LAB — FERRITIN: Ferritin: 43 ng/mL (ref 11–307)

## 2020-12-25 LAB — COMPREHENSIVE METABOLIC PANEL
ALT: 38 U/L (ref 0–44)
AST: 122 U/L — ABNORMAL HIGH (ref 15–41)
Albumin: 3.4 g/dL — ABNORMAL LOW (ref 3.5–5.0)
Alkaline Phosphatase: 56 U/L (ref 38–126)
Anion gap: 9 (ref 5–15)
BUN: 18 mg/dL (ref 8–23)
CO2: 25 mmol/L (ref 22–32)
Calcium: 7.8 mg/dL — ABNORMAL LOW (ref 8.9–10.3)
Chloride: 97 mmol/L — ABNORMAL LOW (ref 98–111)
Creatinine, Ser: 0.93 mg/dL (ref 0.44–1.00)
GFR, Estimated: >60 mL/min (ref >60)
Glucose, Bld: 110 mg/dL — ABNORMAL HIGH (ref 70–99)
Potassium: 3.1 mmol/L — ABNORMAL LOW (ref 3.5–5.1)
Sodium: 131 mmol/L — ABNORMAL LOW (ref 135–145)
Total Bilirubin: 1 mg/dL (ref 0.3–1.2)
Total Protein: 6.1 g/dL — ABNORMAL LOW (ref 6.5–8.1)

## 2020-12-25 LAB — D-DIMER, QUANTITATIVE: D-Dimer, Quant: 0.65 ug/mL-FEU — ABNORMAL HIGH (ref 0.00–0.50)

## 2020-12-25 LAB — C-REACTIVE PROTEIN: CRP: 16.4 mg/dL — ABNORMAL HIGH

## 2020-12-25 LAB — GLUCOSE, CAPILLARY: Glucose-Capillary: 96 mg/dL (ref 70–99)

## 2020-12-25 MED ORDER — METOPROLOL SUCCINATE ER 25 MG PO TB24
12.5000 mg | ORAL_TABLET | Freq: Every day | ORAL | Status: DC
  Administered 2020-12-26: 12.5 mg via ORAL
  Filled 2020-12-25: qty 1

## 2020-12-25 MED ORDER — METOPROLOL SUCCINATE ER 25 MG PO TB24: 25.0000 mg | ORAL_TABLET | Freq: Every day | ORAL | Status: DC

## 2020-12-25 MED ORDER — POTASSIUM CHLORIDE CRYS ER 20 MEQ PO TBCR
40.0000 meq | EXTENDED_RELEASE_TABLET | Freq: Two times a day (BID) | ORAL | Status: DC
  Administered 2020-12-25: 40 meq via ORAL
  Filled 2020-12-25 (×2): qty 2

## 2020-12-25 MED ORDER — ONDANSETRON HCL 4 MG/2ML IJ SOLN
4.0000 mg | INTRAMUSCULAR | Status: DC | PRN
  Administered 2020-12-25 (×4): 4 mg via INTRAVENOUS
  Filled 2020-12-25 (×4): qty 2

## 2020-12-25 MED ORDER — PROCHLORPERAZINE MALEATE 5 MG PO TABS
5.0000 mg | ORAL_TABLET | Freq: Four times a day (QID) | ORAL | Status: DC | PRN
  Filled 2020-12-25 (×2): qty 1

## 2020-12-25 NOTE — Progress Notes (Signed)
PROGRESS NOTE    Karen Madden  [id number] DOB: Feb 07, 1946 DOA: 12/22/2020 PCP: Pcp, No    Brief Narrative:  75 year old female past medical history for hypertension, who presented with a chief complaint of worsening chest pain. Severe symptoms on the day of admission,associated with nausea, left arm pain and diarrhea. Apparently patient was on the toilet and felt as she was going to pass out. EMS was called,EKG showed possible ST elevation myocardial infarction on anterior leads. She underwent emergent cardiac catheterization, angiography, PCI, drug-eluting stent placement to LAD.  Her COVID-19 screen was positive.  Cc:Pt with STEMI in the setting of COVID-19 pneumonia  Consultants:   Cardiology  Procedures:   Antimicrobials:   remdesivir   Subjective: Pt denies sob or cp. C/o terrible nausea, diarrhea. Reports not able to eat b/c nausea. No vomiting.  Objective: Vitals:   12/25/20 0459 12/25/20 0545 12/25/20 0825 12/25/20 1136  BP: 111/67  96/65 107/61  Pulse: 73  71 75  Resp: 20  16 16   Temp: 99.7 F (37.6 C)  98.2 F (36.8 C) 99.1 F (37.3 C)  TempSrc: Oral  Oral Oral  SpO2: 92%  97% 97%  Weight:  72 kg    Height:        Intake/Output Summary (Last 24 hours) at 12/25/2020 1521 Last data filed at 12/25/2020 0545 Gross per 24 hour  Intake --  Output 600 ml  Net -600 ml   Filed Weights   12/23/20 0500 12/24/20 0053 12/25/20 0545  Weight: 75 kg 74.3 kg 72 kg    Examination:  General exam: Appears calm , tired Respiratory system: coarse bs, no wheezing Cardiovascular system: S1 & S2 heard, RRR. No JVD, murmurs, rubs, gallops or clicks. Gastrointestinal system: Abdomen is nondistended, soft and nontender. . Normal bowel sounds heard. Central nervous system: Alert and oriented. Grossly intact Extremities: no edema Psychiatry: Judgement and insight appear normal. Mood & affect appropriate.     Data Reviewed: I have personally reviewed following  labs and imaging studies  CBC: Recent Labs  Lab 12/22/20 1521 12/23/20 0023  WBC 6.2 6.8  NEUTROABS 3.9  --   HGB 10.8* 11.3*  HCT 33.6* 34.0*  MCV 81.8 80.6  PLT 299 [id number]   Basic Metabolic Panel: Recent Labs  Lab 12/22/20 1521 12/23/20 0023 12/24/20 0456 12/25/20 0544  NA 133* 135 133* 131*  K 3.0* 2.9* 3.1* 3.1*  CL 95* 97* 98 97*  CO2 27 27 25 25   GLUCOSE 130* 140* 118* 110*  BUN 20 17 13 18   CREATININE 0.96 0.85 0.70 0.93  CALCIUM 8.7* 8.3* 8.0* 7.8*  MG  --  1.8 2.1  --    GFR: Estimated Creatinine Clearance: 50.8 mL/min (by C-G formula based on SCr of 0.93 mg/dL). Liver Function Tests: Recent Labs  Lab 12/22/20 1521 12/23/20 0023 12/24/20 0456 12/25/20 0544  AST 29 428* 190* 122*  ALT 24 66* 49* 38  ALKPHOS 66 67 56 56  BILITOT 0.7 0.6 0.8 1.0  PROT 6.1* 5.9* 5.8* 6.1*  ALBUMIN 3.7 3.6 3.3* 3.4*   No results for input(s): LIPASE, AMYLASE in the last 168 hours. No results for input(s): AMMONIA in the last 168 hours. Coagulation Profile: Recent Labs  Lab 12/22/20 1521  INR 0.9   Cardiac Enzymes: No results for input(s): CKTOTAL, CKMB, CKMBINDEX, TROPONINI in the last 168 hours. BNP (last 3 results) No results for input(s): PROBNP in the last 8760 hours. HbA1C: No results for input(s): HGBA1C in the last  72 hours. CBG: Recent Labs  Lab 12/22/20 1718 12/23/20 1947  GLUCAP 125* 121*   Lipid Profile: No results for input(s): CHOL, HDL, LDLCALC, TRIG, CHOLHDL, LDLDIRECT in the last 72 hours. Thyroid Function Tests: No results for input(s): TSH, T4TOTAL, FREET4, T3FREE, THYROIDAB in the last 72 hours. Anemia Panel: Recent Labs    12/24/20 0456 12/25/20 0544  FERRITIN 20 43   Sepsis Labs: No results for input(s): PROCALCITON, LATICACIDVEN in the last 168 hours.  Recent Results (from the past 240 hour(s))  Resp Panel by RT-PCR (Flu A&B, Covid) Nasopharyngeal Swab     Status: Abnormal   Collection Time: 12/22/20  3:21 PM   Specimen:  Nasopharyngeal Swab; Nasopharyngeal(NP) swabs in vial transport medium  Result Value Ref Range Status   SARS Coronavirus 2 by RT PCR POSITIVE (A) NEGATIVE Final    Comment: RESULT CALLED TO, READ BACK BY AND VERIFIED WITH: Gildardo Pounds AT 1621 12/22/20.PMF (NOTE) SARS-CoV-2 target nucleic acids are DETECTED.  The SARS-CoV-2 RNA is generally detectable in upper respiratory specimens during the acute phase of infection. Positive results are indicative of the presence of the identified virus, but do not rule out bacterial infection or co-infection with other pathogens not detected by the test. Clinical correlation with patient history and other diagnostic information is necessary to determine patient infection status. The expected result is Negative.  Fact Sheet for Patients: EntrepreneurPulse.com.au  Fact Sheet for Healthcare Providers: IncredibleEmployment.be  This test is not yet approved or cleared by the [place] FDA and  has been authorized for detection and/or diagnosis of SARS-CoV-2 by FDA under an Emergency Use Authorization (EUA).  This EUA will remain in effect (meaning this test can b e used) for the duration of  the COVID-19 declaration under Section 564(b)(1) of the Act, 21 U.S.C. section 360bbb-3(b)(1), unless the authorization is terminated or revoked sooner.     Influenza A by PCR NEGATIVE NEGATIVE Final   Influenza B by PCR NEGATIVE NEGATIVE Final    Comment: (NOTE) The Xpert Xpress SARS-CoV-2/FLU/RSV plus assay is intended as an aid in the diagnosis of influenza from Nasopharyngeal swab specimens and should not be used as a sole basis for treatment. Nasal washings and aspirates are unacceptable for Xpert Xpress SARS-CoV-2/FLU/RSV testing.  Fact Sheet for Patients: EntrepreneurPulse.com.au  Fact Sheet for Healthcare Providers: IncredibleEmployment.be  This test is not yet approved  or cleared by the [place] FDA and has been authorized for detection and/or diagnosis of SARS-CoV-2 by FDA under an Emergency Use Authorization (EUA). This EUA will remain in effect (meaning this test can be used) for the duration of the COVID-19 declaration under Section 564(b)(1) of the Act, 21 U.S.C. section 360bbb-3(b)(1), unless the authorization is terminated or revoked.  Performed at [facility], [address] [place], [place] [address]   MRSA PCR Screening     Status: None   Collection Time: 12/22/20  5:33 PM   Specimen: Nasal Mucosa; Nasopharyngeal  Result Value Ref Range Status   MRSA by PCR NEGATIVE NEGATIVE Final    Comment:        The GeneXpert MRSA Assay (FDA approved for NASAL specimens only), is one component of a comprehensive MRSA colonization surveillance program. It is not intended to diagnose MRSA infection nor to guide or monitor treatment for MRSA infections. Performed at [facility], [address] [place], [place] [address]          Radiology Studies: DG Chest 1 View  Result Date: 12/24/2020  CLINICAL DATA:  Shortness of breath. EXAM: CHEST  1 VIEW COMPARISON:  Chest x-ray 12/22/2020 FINDINGS: The external pacer paddles have been removed. The cardiac silhouette, mediastinal and hilar contours are within normal limits given the AP projection and portable technique. Slightly prominent right paratracheal soft tissue shadow could be related to vascular structures. There is mild tortuosity of the aorta. Worsening lung aeration with suspected pulmonary edema, small pleural effusions and bibasilar atelectasis. No focal airspace consolidation or pneumothorax. IMPRESSION: Worsening lung aeration with suspected pulmonary edema, small pleural effusions and bibasilar atelectasis. Electronically Signed   By: Marijo Sanes M.D.   On: 12/24/2020 09:32   ECHOCARDIOGRAM COMPLETE  Result Date: 12/24/2020    ECHOCARDIOGRAM REPORT   Patient  Name:   MAKENZE BRAMANTE Date of Exam: 12/24/2020 Medical Rec #:  [id number]      Height:       64.0 in Accession #:    [id number]     Weight:       163.9 lb Date of Birth:  16-Nov-1946       BSA:          1.798 m Patient Age:    66 years       BP:           133/72 mmHg Patient Gender: F              HR:           79 bpm. Exam Location:  [facility] Procedure: 2D Echo, Cardiac Doppler, Color Doppler and Strain Analysis Indications:     Acute myocardial infarction I21.9  History:         Patient has no prior history of Echocardiogram examinations.                  Risk Factors:Hypertension.  Sonographer:     Sherrie Sport RDCS (AE) Referring Phys:  [id number] [facility] D [facility]OD Diagnosing Phys: Serafina Royals MD  Sonographer Comments: Global longitudinal strain was attempted. IMPRESSIONS  1. Left ventricular ejection fraction, by estimation, is 30 to 35%. The left ventricle has moderately decreased function. The left ventricle demonstrates regional wall motion abnormalities (see scoring diagram/findings for description). The left ventricular internal cavity size was mildly dilated. Left ventricular diastolic parameters were normal.  2. Right ventricular systolic function is normal. The right ventricular size is normal.  3. The mitral valve is normal in structure. Trivial mitral valve regurgitation.  4. The aortic valve is normal in structure. Aortic valve regurgitation is not visualized. FINDINGS  Left Ventricle: Left ventricular ejection fraction, by estimation, is 30 to 35%. The left ventricle has moderately decreased function. The left ventricle demonstrates regional wall motion abnormalities. Severe akinesis of the left ventricular, mid anteroseptal wall, inferoseptal wall, apical segment and anterior segment. The left ventricular internal cavity size was mildly dilated. There is no left ventricular hypertrophy. Left ventricular diastolic parameters were normal. Right Ventricle: The right ventricular size is normal. No increase in  right ventricular wall thickness. Right ventricular systolic function is normal. Left Atrium: Left atrial size was normal in size. Right Atrium: Right atrial size was normal in size. Pericardium: There is no evidence of pericardial effusion. Mitral Valve: The mitral valve is normal in structure. Trivial mitral valve regurgitation. Tricuspid Valve: The tricuspid valve is normal in structure. Tricuspid valve regurgitation is trivial. Aortic Valve: The aortic valve is normal in structure. Aortic valve regurgitation is not visualized. Aortic valve mean gradient measures 3.0 mmHg. Aortic valve peak gradient measures  4.5 mmHg. Aortic valve area, by VTI measures 2.92 cm. Pulmonic Valve: The pulmonic valve was normal in structure. Pulmonic valve regurgitation is trivial. Aorta: The aortic root and ascending aorta are structurally normal, with no evidence of dilitation. IAS/Shunts: No atrial level shunt detected by color flow Doppler.  LEFT VENTRICLE PLAX 2D LVIDd:         3.64 cm     Diastology LVIDs:         2.40 cm     LV e' medial:    4.46 cm/s LV PW:         1.23 cm     LV E/e' medial:  20.9 LV IVS:        1.35 cm     LV e' lateral:   6.31 cm/s LVOT diam:     2.00 cm     LV E/e' lateral: 14.8 LV SV:         54 LV SV Index:   30 LVOT Area:     3.14 cm  LV Volumes (MOD) LV vol d, MOD A2C: 67.9 ml LV vol d, MOD A4C: 39.6 ml LV vol s, MOD A2C: 37.9 ml LV vol s, MOD A4C: 25.6 ml LV SV MOD A2C:     30.0 ml LV SV MOD A4C:     39.6 ml LV SV MOD BP:      19.6 ml RIGHT VENTRICLE RV Basal diam:  1.83 cm RV S prime:     11.40 cm/s TAPSE (M-mode): 3.9 cm LEFT ATRIUM           Index       RIGHT ATRIUM           Index LA diam:      2.40 cm 1.34 cm/m  RA Area:     12.20 cm LA Vol (A2C): 95.8 ml 53.29 ml/m RA Volume:   28.60 ml  15.91 ml/m LA Vol (A4C): 34.6 ml 19.25 ml/m  AORTIC VALVE                   PULMONIC VALVE AV Area (Vmax):    2.99 cm    PV Vmax:        0.87 m/s AV Area (Vmean):   2.70 cm    PV Peak grad:   3.1 mmHg  AV Area (VTI):     2.92 cm    RVOT Peak grad: 1 mmHg AV Vmax:           106.00 cm/s AV Vmean:          79.000 cm/s AV VTI:            0.185 m AV Peak Grad:      4.5 mmHg AV Mean Grad:      3.0 mmHg LVOT Vmax:         101.00 cm/s LVOT Vmean:        67.900 cm/s LVOT VTI:          0.172 m LVOT/AV VTI ratio: 0.93  AORTA Ao Root diam: 2.40 cm MITRAL VALVE                TRICUSPID VALVE MV Area (PHT): 4.04 cm     TR Peak grad:   10.6 mmHg MV Decel Time: 188 msec     TR Vmax:        163.00 cm/s MV E velocity: 93.40 cm/s MV A velocity: 114.00 cm/s  SHUNTS MV E/A ratio:  0.82  Systemic VTI:  0.17 m                             Systemic Diam: 2.00 cm Arnoldo Hooker MD Electronically signed by Arnoldo Hooker MD Signature Date/Time: 12/24/2020/4:58:59 PM    Final         Scheduled Meds: . aspirin  81 mg Oral Daily  . atorvastatin  80 mg Oral Daily  . buPROPion  150 mg Oral Daily  . chlorpheniramine-HYDROcodone  5 mL Oral Q12H  . cholecalciferol  1,000 Units Oral Daily  . colchicine  0.6 mg Oral BID  . enoxaparin (LOVENOX) injection  40 mg Subcutaneous Q24H  . ezetimibe  10 mg Oral Daily  . famotidine  10 mg Oral BID  . furosemide  40 mg Oral Daily  . gabapentin  400 mg Oral TID  . Ipratropium-Albuterol  1 puff Inhalation QID  . levothyroxine  88 mcg Oral Q0600  . lisinopril  10 mg Oral Daily  . metoprolol tartrate  25 mg Oral BID  . pantoprazole  40 mg Oral BID  . potassium chloride  40 mEq Oral BID  . sodium chloride flush  3 mL Intravenous Q12H  . spironolactone  25 mg Oral Daily  . ticagrelor  90 mg Oral BID   Continuous Infusions: . sodium chloride Stopped (12/23/20 1731)  . sodium chloride    . remdesivir 100 mg in NS 100 mL 100 mg (12/25/20 1314)    Assessment & Plan:   Principal Problem:   STEMI involving left anterior descending coronary artery (HCC) Active Problems:   STEMI (ST elevation myocardial infarction) (HCC)   CAD (coronary artery disease)   HTN (hypertension)    COVID-19 virus infection   Pneumonia due to COVID-19 virus   1. Acute ST elevation myocardial infarction/ new ischemic cardiomyopathy and decompensated systolic heart failure.  SP PCI to proximal LAD.  Continue with Brilinta, aspirin, statin, beta-blocker and lisinopril  Change beta-blocker tartrate to Toprol-XL since she has LV systolic dysfunction Also on Lasix and Aldactone  Echo with EF 30 to 35%, with wall motion abnormality please see full report  Will need cardiac rehab Continue colchicine for possible pericarditis, Per cardiology   2. COVID 19 viral pneumonia with new acute hypoxemic respiratory failure.  Oxygenation improving Continue remdesivir No steroids due to recent ACS    2. HTN.   On low side but with her cardiomyopathy if she is asymptomatic we will continue.   Continue metoprolol and lisinopril     3, Dyslipidemia.   Continue statin therapy  4. Hypokalemia.   As K is 3.1 today  Magnesium 2.1   5. Hypothyroid. Continue with levothyroxine.     DVT prophylaxis: Lovenox Code Status: Full Family Communication: Updated husband via phone  Status is: Inpatient  Remains inpatient appropriate because:Inpatient level of care appropriate due to severity of illness   Dispo: The patient is from: Home              Anticipated d/c is to: Home              Anticipated d/c date is:1-2 days              Patient currently is not medically stable to d/c.has nausea, decrease po intake. Need to make sure able to take po.            LOS: 3 days   Time spent: with >50%  on coc    Nolberto Hanlon, MD [facility] Pager [phone]  If 7PM-7AM, please contact night-coverage 12/25/2020, 3:21 PM

## 2020-12-25 NOTE — Progress Notes (Signed)
Karen Madden    SUBJECTIVE: Patient states to be doing reasonably well no further chest pain has upset stomach and mild diarrhea she has had chronic diarrhea problems since colon surgery for colon cancer.  Patient has mild minimal shortness of breath from Covid and feels reasonably well visiting with her daughter she has had some bradycardia because of metoprolol denies any lightheaded dizziness blackout spells or syncope.  Otherwise tolerating medications well   Vitals:   12/25/20 0545 12/25/20 0825 12/25/20 1136 12/25/20 1546  BP:  96/65 107/61 100/63  Pulse:  71 75 62  Resp:  16 16 19   Temp:  98.2 F (36.8 C) 99.1 F (37.3 C) 98 F (36.7 C)  TempSrc:  Oral Oral Oral  SpO2:  97% 97% 98%  Weight: 72 kg     Height:         Intake/Output Summary (Last 24 hours) at 12/25/2020 2002 Last data filed at 12/25/2020 1526 Gross per 24 hour  Intake 100 ml  Output 600 ml  Net -500 ml      PHYSICAL EXAM  General: Well developed, well nourished, in no acute distress HEENT:  Normocephalic and atramatic Neck:  No JVD.  Lungs: Clear bilaterally to auscultation and percussion. Heart: HRRR . Normal S1 and S2 without gallops or murmurs.  Abdomen: Bowel sounds are positive, abdomen soft and non-tender  Msk:  Back normal, normal gait. Normal strength and tone for age. Extremities: No clubbing, cyanosis or edema.   Neuro: Alert and oriented X 3. Psych:  Good affect, responds appropriately   LABS: Basic Metabolic Panel: Recent Labs    12/23/20 0023 12/24/20 0456 12/25/20 0544  NA 135 133* 131*  K 2.9* 3.1* 3.1*  CL 97* 98 97*  CO2 27 25 25   GLUCOSE 140* 118* 110*  BUN 17 13 18   CREATININE 0.85 0.70 0.93  CALCIUM 8.3* 8.0* 7.8*  MG 1.8 2.1  --    Liver Function Tests: Recent Labs    12/24/20 0456 12/25/20 0544  AST 190* 122*  ALT 49* 38  ALKPHOS 56 56  BILITOT 0.8 1.0  PROT 5.8* 6.1*  ALBUMIN 3.3* 3.4*   No results for input(s): LIPASE, AMYLASE in the last 72  hours. CBC: Recent Labs    12/23/20 0023  WBC 6.8  HGB 11.3*  HCT 34.0*  MCV 80.6  PLT 282   Cardiac Enzymes: No results for input(s): CKTOTAL, CKMB, CKMBINDEX, TROPONINI in the last 72 hours. BNP: Invalid input(s): POCBNP D-Dimer: Recent Labs    12/24/20 0456 12/25/20 0544  DDIMER 0.54* 0.65*   Hemoglobin A1C: No results for input(s): HGBA1C in the last 72 hours. Fasting Lipid Panel: No results for input(s): CHOL, HDL, LDLCALC, TRIG, CHOLHDL, LDLDIRECT in the last 72 hours. Thyroid Function Tests: No results for input(s): TSH, T4TOTAL, T3FREE, THYROIDAB in the last 72 hours.  Invalid input(s): FREET3 Anemia Panel: Recent Labs    12/25/20 0544  FERRITIN 43    DG Chest 1 View  Result Date: 12/24/2020 CLINICAL DATA:  Shortness of breath. EXAM: CHEST  1 VIEW COMPARISON:  Chest x-ray 12/22/2020 FINDINGS: The external pacer paddles have been removed. The cardiac silhouette, mediastinal and hilar contours are within normal limits given the AP projection and portable technique. Slightly prominent right paratracheal soft tissue shadow could be related to vascular structures. There is mild tortuosity of the aorta. Worsening lung aeration with suspected pulmonary edema, small pleural effusions and bibasilar atelectasis. No focal airspace consolidation or pneumothorax. IMPRESSION: Worsening lung  aeration with suspected pulmonary edema, small pleural effusions and bibasilar atelectasis. Electronically Signed   By: Marijo Sanes M.D.   On: 12/24/2020 09:32   ECHOCARDIOGRAM COMPLETE  Result Date: 12/24/2020    ECHOCARDIOGRAM REPORT   Patient Name:   Karen Madden Date of Exam: 12/24/2020 Medical Rec #:  [id number]      Height:       64.0 in Accession #:    [id number]     Weight:       163.9 lb Date of Birth:  Feb 27, 1946       BSA:          1.798 m Patient Age:    75 years       BP:           133/72 mmHg Patient Gender: F              HR:           79 bpm. Exam Location:  [facility] Procedure: 2D  Echo, Cardiac Doppler, Color Doppler and Strain Analysis Indications:     Acute myocardial infarction I21.9  History:         Patient has no prior history of Echocardiogram examinations.                  Risk Factors:Hypertension.  Sonographer:     Sherrie Sport RDCS (AE) Referring Phys:  [id number] [facility] Veld[facility] Diagnosing Phys: Serafina Royals MD  Sonographer Comments: Global longitudinal strain was attempted. IMPRESSIONS  1. Left ventricular ejection fraction, by estimation, is 30 to 35%. The left ventricle has moderately decreased function. The left ventricle demonstrates regional wall motion abnormalities (see scoring diagram/findings for description). The left ventricular internal cavity size was mildly dilated. Left ventricular diastolic parameters were normal.  2. Right ventricular systolic function is normal. The right ventricular size is normal.  3. The mitral valve is normal in structure. Trivial mitral valve regurgitation.  4. The aortic valve is normal in structure. Aortic valve regurgitation is not visualized. FINDINGS  Left Ventricle: Left ventricular ejection fraction, by estimation, is 30 to 35%. The left ventricle has moderately decreased function. The left ventricle demonstrates regional wall motion abnormalities. Severe akinesis of the left ventricular, mid anteroseptal wall, inferoseptal wall, apical segment and anterior segment. The left ventricular internal cavity size was mildly dilated. There is no left ventricular hypertrophy. Left ventricular diastolic parameters were normal. Right Ventricle: The right ventricular size is normal. No increase in right ventricular wall thickness. Right ventricular systolic function is normal. Left Atrium: Left atrial size was normal in size. Right Atrium: Right atrial size was normal in size. Pericardium: There is no evidence of pericardial effusion. Mitral Valve: The mitral valve is normal in structure. Trivial mitral valve regurgitation. Tricuspid Valve: The  tricuspid valve is normal in structure. Tricuspid valve regurgitation is trivial. Aortic Valve: The aortic valve is normal in structure. Aortic valve regurgitation is not visualized. Aortic valve mean gradient measures 3.0 mmHg. Aortic valve peak gradient measures 4.5 mmHg. Aortic valve area, by VTI measures 2.92 cm. Pulmonic Valve: The pulmonic valve was normal in structure. Pulmonic valve regurgitation is trivial. Aorta: The aortic root and ascending aorta are structurally normal, with no evidence of dilitation. IAS/Shunts: No atrial level shunt detected by color flow Doppler.  LEFT VENTRICLE PLAX 2D LVIDd:         3.64 cm     Diastology LVIDs:         2.40 cm  LV e' medial:    4.46 cm/s LV PW:         1.23 cm     LV E/e' medial:  20.9 LV IVS:        1.35 cm     LV e' lateral:   6.31 cm/s LVOT diam:     2.00 cm     LV E/e' lateral: 14.8 LV SV:         54 LV SV Index:   30 LVOT Area:     3.14 cm  LV Volumes (MOD) LV vol d, MOD A2C: 67.9 ml LV vol d, MOD A4C: 39.6 ml LV vol s, MOD A2C: 37.9 ml LV vol s, MOD A4C: 25.6 ml LV SV MOD A2C:     30.0 ml LV SV MOD A4C:     39.6 ml LV SV MOD BP:      19.6 ml RIGHT VENTRICLE RV Basal diam:  1.83 cm RV S prime:     11.40 cm/s TAPSE (M-mode): 3.9 cm LEFT ATRIUM           Index       RIGHT ATRIUM           Index LA diam:      2.40 cm 1.34 cm/m  RA Area:     12.20 cm LA Vol (A2C): 95.8 ml 53.29 ml/m RA Volume:   28.60 ml  15.91 ml/m LA Vol (A4C): 34.6 ml 19.25 ml/m  AORTIC VALVE                   PULMONIC VALVE AV Area (Vmax):    2.99 cm    PV Vmax:        0.87 m/s AV Area (Vmean):   2.70 cm    PV Peak grad:   3.1 mmHg AV Area (VTI):     2.92 cm    RVOT Peak grad: 1 mmHg AV Vmax:           106.00 cm/s AV Vmean:          79.000 cm/s AV VTI:            0.185 m AV Peak Grad:      4.5 mmHg AV Mean Grad:      3.0 mmHg LVOT Vmax:         101.00 cm/s LVOT Vmean:        67.900 cm/s LVOT VTI:          0.172 m LVOT/AV VTI ratio: 0.93  AORTA Ao Root diam: 2.40 cm MITRAL VALVE                 TRICUSPID VALVE MV Area (PHT): 4.04 cm     TR Peak grad:   10.6 mmHg MV Decel Time: 188 msec     TR Vmax:        163.00 cm/s MV E velocity: 93.40 cm/s MV A velocity: 114.00 cm/s  SHUNTS MV E/A ratio:  0.82         Systemic VTI:  0.17 m                             Systemic Diam: 2.00 cm Arnoldo Hooker MD Electronically signed by Arnoldo Hooker MD Signature Date/Time: 12/24/2020/4:58:59 PM    Final      Echo large anterior apical septal akinesis ejection fraction around 35%  TELEMETRY: Normal sinus rhythm nonspecific T changes mild bradycardia rate  in 67s:  ASSESSMENT AND PLAN:  Principal Problem:   STEMI involving left anterior descending coronary artery (HCC) Active Problems:   STEMI (ST elevation myocardial infarction) ([place])   CAD (coronary artery disease)   HTN (hypertension)   COVID-19 virus infection   Pneumonia due to COVID-19 virus Severe ischemic cardiomyopathy ejection fraction 35% Extremity elevated troponins 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 Acute mild congestive heart failure  Plan Continue telemetry follow-up EKGs troponin Continue aspirin Brilinta Lipitor post MI Reduce metoprolol and switch to daily succinate 12.5 Continue ACE inhibitor for cardiomyopathy Discontinue colchicine for 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 Relative hypotension but continue beta-blocker ACE inhibitor Diuretic therapy for mild heart failure We will recommend ordering BNP for assessment of relative heart failure Continue COVID-19 therapy and support Hopefully can increase activity to ambulate in the room Patient suffered a significant myocardial infarction with significant damage to the anterior apical wall and now has a significant cardiomyopathy hopefully support will help some improvement in recovery Patient now has mild heart failure probably related to the cardiomyopathy Anticipate referral to heart failure clinic upon discharge We will have the patient  follow-up with Madden as an outpatient with soon follow-up 1 to 2 weeks afterwards   Yolonda Kida, MD 12/25/2020 8:02 PM

## 2020-12-26 LAB — COMPREHENSIVE METABOLIC PANEL
ALT: 33 U/L (ref 0–44)
AST: 83 U/L — ABNORMAL HIGH (ref 15–41)
Albumin: 3.3 g/dL — ABNORMAL LOW (ref 3.5–5.0)
Alkaline Phosphatase: 58 U/L (ref 38–126)
Anion gap: 12 (ref 5–15)
BUN: 23 mg/dL (ref 8–23)
CO2: 26 mmol/L (ref 22–32)
Calcium: 7.9 mg/dL — ABNORMAL LOW (ref 8.9–10.3)
Chloride: 98 mmol/L (ref 98–111)
Creatinine, Ser: 1.03 mg/dL — ABNORMAL HIGH (ref 0.44–1.00)
GFR, Estimated: 57 mL/min — ABNORMAL LOW (ref >60)
Glucose, Bld: 102 mg/dL — ABNORMAL HIGH (ref 70–99)
Potassium: 3.1 mmol/L — ABNORMAL LOW (ref 3.5–5.1)
Sodium: 136 mmol/L (ref 135–145)
Total Bilirubin: 1.1 mg/dL (ref 0.3–1.2)
Total Protein: 6.1 g/dL — ABNORMAL LOW (ref 6.5–8.1)

## 2020-12-26 LAB — D-DIMER, QUANTITATIVE: D-Dimer, Quant: 0.71 ug/mL-FEU — ABNORMAL HIGH (ref 0.00–0.50)

## 2020-12-26 LAB — C-REACTIVE PROTEIN: CRP: 16.6 mg/dL — ABNORMAL HIGH (ref <1.0)

## 2020-12-26 LAB — BRAIN NATRIURETIC PEPTIDE: B Natriuretic Peptide: 423.2 pg/mL — ABNORMAL HIGH (ref 0.0–100.0)

## 2020-12-26 LAB — FERRITIN: Ferritin: 65 ng/mL (ref 11–307)

## 2020-12-26 MED ORDER — METOPROLOL SUCCINATE ER 25 MG PO TB24: 12.5000 mg | ORAL_TABLET | Freq: Every day | ORAL | 0 refills | Status: DC

## 2020-12-26 MED ORDER — TICAGRELOR 90 MG PO TABS: 90.0000 mg | ORAL_TABLET | Freq: Two times a day (BID) | ORAL | 1 refills | Status: AC

## 2020-12-26 MED ORDER — POTASSIUM CHLORIDE CRYS ER 20 MEQ PO TBCR
40.0000 meq | EXTENDED_RELEASE_TABLET | Freq: Once | ORAL | Status: AC
  Administered 2020-12-26: 40 meq via ORAL
  Filled 2020-12-26: qty 2

## 2020-12-26 MED ORDER — FUROSEMIDE 40 MG PO TABS: 40.0000 mg | ORAL_TABLET | Freq: Every day | ORAL | 0 refills | Status: DC

## 2020-12-26 MED ORDER — SPIRONOLACTONE 25 MG PO TABS: 25.0000 mg | ORAL_TABLET | Freq: Every day | ORAL | 0 refills | Status: DC

## 2020-12-26 MED ORDER — ATORVASTATIN CALCIUM 80 MG PO TABS: 80.0000 mg | ORAL_TABLET | Freq: Every day | ORAL | 1 refills | Status: DC

## 2020-12-26 MED ORDER — POTASSIUM CHLORIDE 10 MEQ/100ML IV SOLN
10.0000 meq | INTRAVENOUS | Status: DC
  Administered 2020-12-26: 10 meq via INTRAVENOUS
  Filled 2020-12-26 (×4): qty 100

## 2020-12-26 MED ORDER — LISINOPRIL 10 MG PO TABS: 10.0000 mg | ORAL_TABLET | Freq: Every day | ORAL | 1 refills | Status: DC

## 2020-12-26 NOTE — Discharge Summary (Signed)
Karen Madden WER:[id number] DOB: 07/10/1946 DOA: 12/22/2020  PCP: Pcp, No  Admit date: 12/22/2020 Discharge date: 12/26/2020  Admitted From: home Disposition:  home  Recommendations for Outpatient Follow-up:  1. Follow up with PCP in 1 week 2. Please obtain BMP/CBC in one week 3. Follow up with cardiology in one week  Home Health:yes    Discharge Condition:Stable CODE STATUS:full  Diet recommendation: Heart Healthy  Brief/Interim Summary: Per HPI:  Karen Madden is a 75 y.o. female with medical history significant for hypertension who is admitted to [facility] on 12/22/2020 with STEMI after being taken from [facility] ED directly to cath lab after presenting  to Emergency Department complaining of chest pain.Patient taken to Cath Lab by Dr. Clayborn Bigness of cardiology with coronary angiography revealing ostial occlusion of the LAD, prompting PCI with placement of DES to the proximal LAD. Of note, routine screening nasopharyngeal COVID-19 PCR was found to be positive .Initially appears to be asymptomatic, but then found with worsening oxygen requirements and elevated inflammatory markers.   1. Acute ST elevation myocardial infarction/ new ischemic cardiomyopathy and decompensated systolic heart failure.  SP PCI to proximal LAD. Continue with Brilinta, aspirin, statin, beta-blocker and lisinopril  Also on Lasix and Aldactone  Echo with EF 30 to 35%, with wall motion abnormality please see full report  Will need cardiac rehab F/u with cardiology in one week   2. COVID 19 viral pneumoniawith new acute hypoxemic respiratory failure. Oxygenation improved. On RA at rest and with ambulation in the 02 sat 90's Has received 4 doses of Remdesivir iv.  No steroids due to recent ACS Stable to be discharged    2. HTN. continue with cardiac meds as above  3, Dyslipidemia.  on statin  4. Hypokalemia. replaced with iv /po prior to discharge Magnesium 2.1  Ck labs as  outpatient within one week  5. Hypothyroid. Continue with levothyroxine.  Discharge Diagnoses:  Principal Problem:   STEMI involving left anterior descending coronary artery (HCC) Active Problems:   STEMI (ST elevation myocardial infarction) ([place])   CAD (coronary artery disease)   HTN (hypertension)   COVID-19 virus infection   Pneumonia due to COVID-19 virus    Discharge Instructions  Discharge Instructions    AMB Referral to Cardiac Rehabilitation - Phase II   Complete by: As directed    Diagnosis: STEMI   After initial evaluation and assessments completed: Virtual Based Care may be provided alone or in conjunction with Phase 2 Cardiac Rehab based on patient barriers.: Yes   AMB referral to CHF clinic   Complete by: As directed    Call MD for:  difficulty breathing, headache or visual disturbances   Complete by: As directed    Diet - low sodium heart healthy   Complete by: As directed    Discharge instructions   Complete by: As directed    Need quarantine for 10 days total from time of diagnsosis of covid F/u with pcp in one week   Increase activity slowly   Complete by: As directed      Allergies as of 12/26/2020      Reactions   Morphine And Related Anxiety   Hallucinations, nausea, vomiting, "doesn't help my pain at all"      Medication List    STOP taking these medications   bisoprolol 10 MG tablet Commonly known as: ZEBETA   chlorthalidone 25 MG tablet Commonly known as: HYGROTON   conjugated estrogens vaginal cream Commonly known as: PREMARIN  olmesartan 40 MG tablet Commonly known as: BENICAR   POTASSIUM CHLORIDE PO     TAKE these medications   aspirin 81 MG chewable tablet Chew by mouth daily.   atorvastatin 80 MG tablet Commonly known as: LIPITOR Take 1 tablet (80 mg total) by mouth daily. Start taking on: December 27, 2020   buPROPion 150 MG 12 hr tablet Commonly known as: ZYBAN Take 150 mg by mouth 2 (two) times daily.   buPROPion  150 MG 24 hr tablet Commonly known as: WELLBUTRIN XL Take 150 mg by mouth daily.   cholecalciferol 10 MCG (400 UNIT) Tabs tablet Commonly known as: VITAMIN D3 Take 1,000 Units by mouth daily.   diphenhydrAMINE 25 MG tablet Commonly known as: SOMINEX Take 25 mg by mouth at bedtime as needed for sleep.   ezetimibe 10 MG tablet Commonly known as: ZETIA Take 10 mg by mouth daily.   famotidine 10 MG tablet Commonly known as: PEPCID Take 10 mg by mouth 2 (two) times daily.   furosemide 40 MG tablet Commonly known as: LASIX Take 1 tablet (40 mg total) by mouth daily. Start taking on: December 27, 2020   gabapentin 400 MG capsule Commonly known as: NEURONTIN Take 400 mg by mouth 3 (three) times daily. Pt takes 400 mg once daily   levothyroxine 88 MCG tablet Commonly known as: SYNTHROID Take 88 mcg by mouth daily before breakfast.   lisinopril 10 MG tablet Commonly known as: ZESTRIL Take 1 tablet (10 mg total) by mouth daily. Start taking on: December 27, 2020   loperamide 2 MG tablet Commonly known as: IMODIUM A-D Take 2 mg by mouth 4 (four) times daily as needed for diarrhea or loose stools.   metoprolol succinate 25 MG 24 hr tablet Commonly known as: TOPROL-XL Take 0.5 tablets (12.5 mg total) by mouth daily. Start taking on: December 27, 2020   spironolactone 25 MG tablet Commonly known as: ALDACTONE Take 1 tablet (25 mg total) by mouth daily. Start taking on: December 27, 2020   ticagrelor 90 MG Tabs tablet Commonly known as: BRILINTA Take 1 tablet (90 mg total) by mouth 2 (two) times daily.   triamcinolone 0.025 % cream Commonly known as: KENALOG Apply 1 application topically 2 (two) times daily.       Follow-up Information    Yolonda Kida, MD On 01/02/2021.   Specialties: Cardiology, Internal Medicine Why: @ 2:15pm Contact information: [address] [place] [address] [phone]              Allergies  Allergen Reactions  .  Morphine And Related Anxiety    Hallucinations, nausea, vomiting, "doesn't help my pain at all"    Consultations:  cardiology   Procedures/Studies: DG Chest 1 View  Result Date: 12/24/2020 CLINICAL DATA:  Shortness of breath. EXAM: CHEST  1 VIEW COMPARISON:  Chest x-ray 12/22/2020 FINDINGS: The external pacer paddles have been removed. The cardiac silhouette, mediastinal and hilar contours are within normal limits given the AP projection and portable technique. Slightly prominent right paratracheal soft tissue shadow could be related to vascular structures. There is mild tortuosity of the aorta. Worsening lung aeration with suspected pulmonary edema, small pleural effusions and bibasilar atelectasis. No focal airspace consolidation or pneumothorax. IMPRESSION: Worsening lung aeration with suspected pulmonary edema, small pleural effusions and bibasilar atelectasis. Electronically Signed   By: Marijo Sanes M.D.   On: 12/24/2020 09:32   CARDIAC CATHETERIZATION  Addendum Date: 12/23/2020    Ost LAD lesion is  80% stenosed.  Prox LAD lesion is 100% stenosed.  Ost Cx to Prox Cx lesion is 25% stenosed.  Mid Cx to Dist Cx lesion is 50% stenosed.  Prox RCA to Dist RCA lesion is 25% stenosed with 25% stenosed side branch in RPAV.  Dist LAD lesion is 90% stenosed.  Moderately depressed left ventricular function between 30 to 35% with anterior apical akinesis  A drug-eluting stent was successfully placed using a STENT RESOLUTE ONYX 2.5X38.  Post intervention, there is a 0% residual stenosis.  Post intervention, there is a 0% residual stenosis.  Conclusion STEMI presentation EKG with subtle anterior ST elevation but septal anterior Q waves Diagnostic cardiac cath showed large left main free of disease LAD 100% occluded ostially TIMI 0 flow Circumflex was large with just minor disease TIMI-3 flow RCA was medium in size, diffuse 25 to 50% Left ventricle Moderately depressed left ventricular function 30 to  35% anterior apical akinesis Intervention Successful PCI and stent of ostial proximal LAD with DES 2.5 x 34 mm resolute Onyx Postdilated with a 2.75 x 15 mm [place] trek to 14 atm TIMI-3 flow was restored Appeared to be residual very distal apical lesion of 90% which was not identified until flow was restored Distal LAD lesion was deferred Patient identity incidentally tested positive for Covid while on the table Minx was deployed to right groin femoral artery Patient transferred to CCU for recovery Hospitalist service  notified   Result Date: 12/22/2020  Ost LAD lesion is 80% stenosed.  Prox LAD lesion is 100% stenosed.  Ost Cx to Prox Cx lesion is 25% stenosed.  Mid Cx to Dist Cx lesion is 50% stenosed.  Prox RCA to Dist RCA lesion is 25% stenosed with 25% stenosed side branch in RPAV.  Dist LAD lesion is 90% stenosed.  Moderately depressed left ventricular function between 30 to 35% with anterior apical akinesis  Conclusion STEMI presentation EKG with subtle anterior ST elevation but septal anterior Q waves Diagnostic cardiac cath showed large left main free of disease LAD 100% occluded ostially TIMI 0 flow Circumflex was large with just minor disease TIMI-3 flow RCA was medium in size, diffuse 25 to 50% Left ventricle Moderately depressed left ventricular function 30 to 35% anterior apical akinesis Intervention Successful PCI and stent of ostial proximal LAD with DES 2.5 x 34 mm resolute Onyx Postdilated with a 2.75 x 15 mm [place] trek to 14 atm TIMI-3 flow was restored Appeared to be residual very distal apical lesion of 90% which was not identified until flow was restored Distal LAD lesion was deferred Patient identity incidentally tested positive for Covid while on the table Minx was deployed to right groin femoral artery Patient transferred to CCU for recovery Hospitalist service  notified  DG Chest Port 1 View  Result Date: 12/22/2020 CLINICAL DATA:  Chest pain. EXAM: PORTABLE CHEST 1 VIEW COMPARISON:   None. FINDINGS: The heart size and mediastinal contours are within normal limits. Mild patchy opacity of right lung base is noted. There is no pleural effusion or pulmonary edema. The visualized skeletal structures are unremarkable. IMPRESSION: Mild patchy opacity of right lung base, suspicious for pneumonia. Electronically Signed   By: Sherian Rein M.D.   On: 12/22/2020 15:48   ECHOCARDIOGRAM COMPLETE  Result Date: 12/24/2020    ECHOCARDIOGRAM REPORT   Patient Name:   MAYME PROFETA Date of Exam: 12/24/2020 Medical Rec #:  [id number]      Height:       64.0 in  Accession #:    [id number]     Weight:       163.9 lb Date of Birth:  [birth year]       BSA:          1.798 m Patient Age:    75 years       BP:           133/72 mmHg Patient Gender: F              HR:           79 bpm. Exam Location:  [facility] Procedure: 2D Echo, Cardiac Doppler, Color Doppler and Strain Analysis Indications:     Acute myocardial infarction I21.9  History:         Patient has no prior history of Echocardiogram examinations.                  Risk Factors:Hypertension.  Sonographer:     Sherrie Sport RDCS (AE) Referring Phys:  [id number] [facility]OD Diagnosing Phys: Serafina Royals MD  Sonographer Comments: Global longitudinal strain was attempted. IMPRESSIONS  1. Left ventricular ejection fraction, by estimation, is 30 to 35%. The left ventricle has moderately decreased function. The left ventricle demonstrates regional wall motion abnormalities (see scoring diagram/findings for description). The left ventricular internal cavity size was mildly dilated. Left ventricular diastolic parameters were normal.  2. Right ventricular systolic function is normal. The right ventricular size is normal.  3. The mitral valve is normal in structure. Trivial mitral valve regurgitation.  4. The aortic valve is normal in structure. Aortic valve regurgitation is not visualized. FINDINGS  Left Ventricle: Left ventricular ejection fraction, by estimation, is 30 to 35%.  The left ventricle has moderately decreased function. The left ventricle demonstrates regional wall motion abnormalities. Severe akinesis of the left ventricular, mid anteroseptal wall, inferoseptal wall, apical segment and anterior segment. The left ventricular internal cavity size was mildly dilated. There is no left ventricular hypertrophy. Left ventricular diastolic parameters were normal. Right Ventricle: The right ventricular size is normal. No increase in right ventricular wall thickness. Right ventricular systolic function is normal. Left Atrium: Left atrial size was normal in size. Right Atrium: Right atrial size was normal in size. Pericardium: There is no evidence of pericardial effusion. Mitral Valve: The mitral valve is normal in structure. Trivial mitral valve regurgitation. Tricuspid Valve: The tricuspid valve is normal in structure. Tricuspid valve regurgitation is trivial. Aortic Valve: The aortic valve is normal in structure. Aortic valve regurgitation is not visualized. Aortic valve mean gradient measures 3.0 mmHg. Aortic valve peak gradient measures 4.5 mmHg. Aortic valve area, by VTI measures 2.92 cm. Pulmonic Valve: The pulmonic valve was normal in structure. Pulmonic valve regurgitation is trivial. Aorta: The aortic root and ascending aorta are structurally normal, with no evidence of dilitation. IAS/Shunts: No atrial level shunt detected by color flow Doppler.  LEFT VENTRICLE PLAX 2D LVIDd:         3.64 cm     Diastology LVIDs:         2.40 cm     LV e' medial:    4.46 cm/s LV PW:         1.23 cm     LV E/e' medial:  20.9 LV IVS:        1.35 cm     LV e' lateral:   6.31 cm/s LVOT diam:     2.00 cm     LV E/e' lateral: 14.8 LV SV:  54 LV SV Index:   30 LVOT Area:     3.14 cm  LV Volumes (MOD) LV vol d, MOD A2C: 67.9 ml LV vol d, MOD A4C: 39.6 ml LV vol s, MOD A2C: 37.9 ml LV vol s, MOD A4C: 25.6 ml LV SV MOD A2C:     30.0 ml LV SV MOD A4C:     39.6 ml LV SV MOD BP:      19.6 ml RIGHT  VENTRICLE RV Basal diam:  1.83 cm RV S prime:     11.40 cm/s TAPSE (M-mode): 3.9 cm LEFT ATRIUM           Index       RIGHT ATRIUM           Index LA diam:      2.40 cm 1.34 cm/m  RA Area:     12.20 cm LA Vol (A2C): 95.8 ml 53.29 ml/m RA Volume:   28.60 ml  15.91 ml/m LA Vol (A4C): 34.6 ml 19.25 ml/m  AORTIC VALVE                   PULMONIC VALVE AV Area (Vmax):    2.99 cm    PV Vmax:        0.87 m/s AV Area (Vmean):   2.70 cm    PV Peak grad:   3.1 mmHg AV Area (VTI):     2.92 cm    RVOT Peak grad: 1 mmHg AV Vmax:           106.00 cm/s AV Vmean:          79.000 cm/s AV VTI:            0.185 m AV Peak Grad:      4.5 mmHg AV Mean Grad:      3.0 mmHg LVOT Vmax:         101.00 cm/s LVOT Vmean:        67.900 cm/s LVOT VTI:          0.172 m LVOT/AV VTI ratio: 0.93  AORTA Ao Root diam: 2.40 cm MITRAL VALVE                TRICUSPID VALVE MV Area (PHT): 4.04 cm     TR Peak grad:   10.6 mmHg MV Decel Time: 188 msec     TR Vmax:        163.00 cm/s MV E velocity: 93.40 cm/s MV A velocity: 114.00 cm/s  SHUNTS MV E/A ratio:  0.82         Systemic VTI:  0.17 m                             Systemic Diam: 2.00 cm Serafina Royals MD Electronically signed by Serafina Royals MD Signature Date/Time: 12/24/2020/4:58:59 PM    Final        Subjective:   Discharge Exam: Vitals:   12/26/20 0357 12/26/20 0904  BP: 125/69 136/81  Pulse: 69 69  Resp: 16 19  Temp: 98.2 F (36.8 C) 98.1 F (36.7 C)  SpO2: 97% 97%   Vitals:   12/25/20 2025 12/26/20 0203 12/26/20 0357 12/26/20 0904  BP: 111/66  125/69 136/81  Pulse: 69  69 69  Resp: _0 Temp: 98.4 F (36.9 C)  98.2 F (36.8 C) 98.1 F (36.7 C)  TempSrc: Oral  Oral Oral  SpO2: 96%  97% 97%  Weight:  74.3 kg    Height:        General: Pt is alert, awake, not in acute distress Cardiovascular: RRR, S1/S2 +, no rubs, no gallops Respiratory: CTA bilaterally, no wheezing, no rhonchi Abdominal: Soft, NT, ND, bowel sounds + Extremities: no edema, no  cyanosis    The results of significant diagnostics from this hospitalization (including imaging, microbiology, ancillary and laboratory) are listed below for reference.     Microbiology: Recent Results (from the past 240 hour(s))  Resp Panel by RT-PCR (Flu A&B, Covid) Nasopharyngeal Swab     Status: Abnormal   Collection Time: 12/22/20  3:21 PM   Specimen: Nasopharyngeal Swab; Nasopharyngeal(NP) swabs in vial transport medium  Result Value Ref Range Status   SARS Coronavirus 2 by RT PCR POSITIVE (A) NEGATIVE Final    Comment: RESULT CALLED TO, READ BACK BY AND VERIFIED WITH: Gildardo Pounds AT 1621 12/22/20.PMF (NOTE) SARS-CoV-2 target nucleic acids are DETECTED.  The SARS-CoV-2 RNA is generally detectable in upper respiratory specimens during the acute phase of infection. Positive results are indicative of the presence of the identified virus, but do not rule out bacterial infection or co-infection with other pathogens not detected by the test. Clinical correlation with patient history and other diagnostic information is necessary to determine patient infection status. The expected result is Negative.  Fact Sheet for Patients: EntrepreneurPulse.com.au  Fact Sheet for Healthcare Providers: IncredibleEmployment.be  This test is not yet approved or cleared by the [place] FDA and  has been authorized for detection and/or diagnosis of SARS-CoV-2 by FDA under an Emergency Use Authorization (EUA).  This EUA will remain in effect (meaning this test can b e used) for the duration of  the COVID-19 declaration under Section 564(b)(1) of the Act, 21 U.S.C. section 360bbb-3(b)(1), unless the authorization is terminated or revoked sooner.     Influenza A by PCR NEGATIVE NEGATIVE Final   Influenza B by PCR NEGATIVE NEGATIVE Final    Comment: (NOTE) The Xpert Xpress SARS-CoV-2/FLU/RSV plus assay is intended as an aid in the diagnosis of influenza  from Nasopharyngeal swab specimens and should not be used as a sole basis for treatment. Nasal washings and aspirates are unacceptable for Xpert Xpress SARS-CoV-2/FLU/RSV testing.  Fact Sheet for Patients: EntrepreneurPulse.com.au  Fact Sheet for Healthcare Providers: IncredibleEmployment.be  This test is not yet approved or cleared by the [place] FDA and has been authorized for detection and/or diagnosis of SARS-CoV-2 by FDA under an Emergency Use Authorization (EUA). This EUA will remain in effect (meaning this test can be used) for the duration of the COVID-19 declaration under Section 564(b)(1) of the Act, 21 U.S.C. section 360bbb-3(b)(1), unless the authorization is terminated or revoked.  Performed at [facility], [address] [place], [place] [address]   MRSA PCR Screening     Status: None   Collection Time: 12/22/20  5:33 PM   Specimen: Nasal Mucosa; Nasopharyngeal  Result Value Ref Range Status   MRSA by PCR NEGATIVE NEGATIVE Final    Comment:        The GeneXpert MRSA Assay (FDA approved for NASAL specimens only), is one component of a comprehensive MRSA colonization surveillance program. It is not intended to diagnose MRSA infection nor to guide or monitor treatment for MRSA infections. Performed at [facility], [address] [place], [place] [address]      Labs: BNP (last 3 results) Recent Labs    12/22/20 1521 12/26/20 0449  BNP 185.2* 423.2*  Basic Metabolic Panel: Recent Labs  Lab 12/22/20 1521 12/23/20 0023 12/24/20 0456 12/25/20 0544 12/26/20 0449  NA 133* 135 133* 131* 136  K 3.0* 2.9* 3.1* 3.1* 3.1*  CL 95* 97* 98 97* 98  CO2 _0 GLUCOSE 130* 140* 118* 110* 102*  BUN _1 CREATININE 0.96 0.85 0.70 0.93 1.03*  CALCIUM 8.7* 8.3* 8.0* 7.8* 7.9*  MG  --  1.8 2.1  --   --    Liver Function Tests: Recent Labs  Lab 12/22/20 1521 12/23/20 0023  12/24/20 0456 12/25/20 0544 12/26/20 0449  AST 29 428* 190* 122* 83*  ALT 24 66* 49* 38 33  ALKPHOS 66 67 56 56 58  BILITOT 0.7 0.6 0.8 1.0 1.1  PROT 6.1* 5.9* 5.8* 6.1* 6.1*  ALBUMIN 3.7 3.6 3.3* 3.4* 3.3*   No results for input(s): LIPASE, AMYLASE in the last 168 hours. No results for input(s): AMMONIA in the last 168 hours. CBC: Recent Labs  Lab 12/22/20 1521 12/23/20 0023  WBC 6.2 6.8  NEUTROABS 3.9  --   HGB 10.8* 11.3*  HCT 33.6* 34.0*  MCV 81.8 80.6  PLT 299 282   Cardiac Enzymes: No results for input(s): CKTOTAL, CKMB, CKMBINDEX, TROPONINI in the last 168 hours. BNP: Invalid input(s): POCBNP CBG: Recent Labs  Lab 12/22/20 1718 12/23/20 1947 12/25/20 1728  GLUCAP 125* 121* 96   D-Dimer Recent Labs    12/25/20 0544 12/26/20 0449  DDIMER 0.65* 0.71*   Hgb A1c No results for input(s): HGBA1C in the last 72 hours. Lipid Profile No results for input(s): CHOL, HDL, LDLCALC, TRIG, CHOLHDL, LDLDIRECT in the last 72 hours. Thyroid function studies No results for input(s): TSH, T4TOTAL, T3FREE, THYROIDAB in the last 72 hours.  Invalid input(s): FREET3 Anemia work up [employer]    12/25/20 0544 12/26/20 0449  FERRITIN 43 65   Urinalysis No results found for: COLORURINE, APPEARANCEUR, LABSPEC, [place], GLUCOSEU, [place], [place], [place], PROTEINUR, UROBILINOGEN, NITRITE, LEUKOCYTESUR Sepsis Labs Invalid input(s): PROCALCITONIN,  WBC,  LACTICIDVEN Microbiology Recent Results (from the past 240 hour(s))  Resp Panel by RT-PCR (Flu A&B, Covid) Nasopharyngeal Swab     Status: Abnormal   Collection Time: 12/22/20  3:21 PM   Specimen: Nasopharyngeal Swab; Nasopharyngeal(NP) swabs in vial transport medium  Result Value Ref Range Status   SARS Coronavirus 2 by RT PCR POSITIVE (A) NEGATIVE Final    Comment: RESULT CALLED TO, READ BACK BY AND VERIFIED WITH: Gildardo Pounds AT 1941 12/22/20.PMF (NOTE) SARS-CoV-2 target nucleic acids are DETECTED.  The  SARS-CoV-2 RNA is generally detectable in upper respiratory specimens during the acute phase of infection. Positive results are indicative of the presence of the identified virus, but do not rule out bacterial infection or co-infection with other pathogens not detected by the test. Clinical correlation with patient history and other diagnostic information is necessary to determine patient infection status. The expected result is Negative.  Fact Sheet for Patients: EntrepreneurPulse.com.au  Fact Sheet for Healthcare Providers: IncredibleEmployment.be  This test is not yet approved or cleared by the [place] FDA and  has been authorized for detection and/or diagnosis of SARS-CoV-2 by FDA under an Emergency Use Authorization (EUA).  This EUA will remain in effect (meaning this test can b e used) for the duration of  the COVID-19 declaration under Section 564(b)(1) of the Act, 21 U.S.C. section 360bbb-3(b)(1), unless the authorization is terminated or revoked sooner.     Influenza A by  PCR NEGATIVE NEGATIVE Final   Influenza B by PCR NEGATIVE NEGATIVE Final    Comment: (NOTE) The Xpert Xpress SARS-CoV-2/FLU/RSV plus assay is intended as an aid in the diagnosis of influenza from Nasopharyngeal swab specimens and should not be used as a sole basis for treatment. Nasal washings and aspirates are unacceptable for Xpert Xpress SARS-CoV-2/FLU/RSV testing.  Fact Sheet for Patients: EntrepreneurPulse.com.au  Fact Sheet for Healthcare Providers: IncredibleEmployment.be  This test is not yet approved or cleared by the [place] FDA and has been authorized for detection and/or diagnosis of SARS-CoV-2 by FDA under an Emergency Use Authorization (EUA). This EUA will remain in effect (meaning this test can be used) for the duration of the COVID-19 declaration under Section 564(b)(1) of the Act, 21 U.S.C. section  360bbb-3(b)(1), unless the authorization is terminated or revoked.  Performed at [facility], [address] [place], [place] [address]   MRSA PCR Screening     Status: None   Collection Time: 12/22/20  5:33 PM   Specimen: Nasal Mucosa; Nasopharyngeal  Result Value Ref Range Status   MRSA by PCR NEGATIVE NEGATIVE Final    Comment:        The GeneXpert MRSA Assay (FDA approved for NASAL specimens only), is one component of a comprehensive MRSA colonization surveillance program. It is not intended to diagnose MRSA infection nor to guide or monitor treatment for MRSA infections. Performed at [facility], [address], [place], [place] [address]      Time coordinating discharge: Over 30 minutes  SIGNED:   Nolberto Hanlon, MD  [facility] 12/26/2020, 11:53 AM Pager   If 7PM-7AM, please contact night-coverage [email or web address] Password [email or web address]

## 2020-12-26 NOTE — Progress Notes (Signed)
Ambulated patient in room and to restroom and back to bed. Oxygen remained greater than 95% throughout ambulation. MD Amery notified via secure chat in response to her secure chat tome to assess patient's oxygenation while ambulating.

## 2020-12-26 NOTE — Care Management Important Message (Signed)
Important Message  Patient Details  Name: Karen Madden MRN: [id number] Date of Birth: Jul 21, 1946   Medicare Important Message Given:  No  Patient discharged prior to attempt to review Medicare IM.   Johnell Comings 12/26/2020, 1:14 PM

## 2020-12-27 NOTE — Care Management (Signed)
Late Entry: Patient admitted for NSTEMI Patient lives at home with husband Patient covid positive  Patient states her PCP is at [facility].  At baseline she is able to drive her self to her appointments  Denies issues obtaining medications  PT has assessed patient and recommends home health.  Patient in agreement.  States she does not have a preference of home health agency.  Referral made to [facility] with [place]   Patient states that she has a cane in the home.  Denies RW at discharge

## 2021-01-10 NOTE — Progress Notes (Signed)
Patient ID: Karen Madden, female    DOB: [birth year], 75 y.o.   MRN: [id number]  HPI  Karen Madden is a 75 y/o female with a history of CAD, HTN and chronic heart failure.   Echo report from 12/24/20 reviewed and showed an EF of 30-35% along with trivial MR.   LHC done 12/22/20 and showed:  Ost LAD lesion is 80% stenosed.  Prox LAD lesion is 100% stenosed.  Ost Cx to Prox Cx lesion is 25% stenosed.  Mid Cx to Dist Cx lesion is 50% stenosed.  Prox RCA to Dist RCA lesion is 25% stenosed with 25% stenosed side branch in RPAV.  Dist LAD lesion is 90% stenosed.  Moderately depressed left ventricular function between 30 to 35% with anterior apical akinesis  A drug-eluting stent was successfully placed using a STENT RESOLUTE ONYX 2.5X38.  Post intervention, there is a 0% residual stenosis  Conclusion STEMI presentation EKG with subtle anterior ST elevation but septal anterior Q waves Diagnostic cardiac cath showed large left main free of disease LAD 100% occluded ostially TIMI 0 flow Circumflex was large with just minor disease TIMI-3 flow RCA was medium in size, diffuse 25 to 50% Left ventricle Moderately depressed left ventricular function 30 to 35% anterior apical akinesis Intervention Successful PCI and stent of ostial proximal LAD with DES 2.5 x 34 mm resolute Onyx Postdilated with a 2.75 x 15 mm [place] to 14 atm  Admitted 12/22/20 due to chest pain. Cardiology consult obtained. Taken directly to cath lab due to STEMI and had PCI/ stent per above. Received 4 doses of remdesivir. Discharged after 4 days.   She presents today for her initial visit with a chief complaint of moderate fatigue upon minimal exertion. She says that this has been present for several weeks and doesn't feel like her energy level has improved much at all. She has associated chest tightness, shortness of breath and easy bruising along with this. She denies any difficulty sleeping, abdominal distention,  palpitations, pedal edema, chest pain, dizziness, cough or weight gain.   She says that cardiology stopped her lisinopril due to a cough and she's getting ready to start losartan/ HCTZ but says her pharmacy is having difficulty in getting the medication.   She says that she thought today's visit was for cardiac rehab. Finished up home PT last week.   Past Medical History:  Diagnosis Date  . CHF (congestive heart failure) ([place])   . Coronary artery disease   . Hypertension    Past Surgical History:  Procedure Laterality Date  . CORONARY/GRAFT ACUTE MI REVASCULARIZATION N/A 12/22/2020   Procedure: Coronary/Graft Acute MI Revascularization;  Surgeon: Yolonda Kida, MD;  Location: [place] [facility];  Service: Cardiovascular;  Laterality: N/A;  . LEFT HEART CATH AND CORONARY ANGIOGRAPHY N/A 12/22/2020   Procedure: LEFT HEART CATH AND CORONARY ANGIOGRAPHY;  Surgeon: Yolonda Kida, MD;  Location: [place] [facility];  Service: Cardiovascular;  Laterality: N/A;   History reviewed. No pertinent family history. Social History   Tobacco Use  . Smoking status: Never Smoker  . Smokeless tobacco: Never Used  Substance Use Topics  . Alcohol use: Not Currently   Allergies  Allergen Reactions  . Morphine And Related Anxiety    Hallucinations, nausea, vomiting, "doesn't help my pain at all"   Prior to Admission medications   Medication Sig Start Date End Date Taking? Authorizing Provider  aspirin 81 MG chewable tablet Chew by mouth daily.   Yes  [provider]  atorvastatin (LIPITOR) 80 MG tablet Take 1 tablet (80 mg total) by mouth daily. 12/27/20 01/26/21 Yes Nolberto Hanlon, MD  buPROPion (WELLBUTRIN XL) 150 MG 24 hr tablet Take 150 mg by mouth daily.   Yes [provider]  cholecalciferol (VITAMIN D3) 10 MCG (400 UNIT) TABS tablet Take 1,000 Units by mouth daily.   Yes [provider]  diphenhydrAMINE (SIMPLY SLEEP) 25 MG tablet Take 25 mg by mouth at bedtime as  needed for sleep.   Yes [provider]  ezetimibe (ZETIA) 10 MG tablet Take 10 mg by mouth daily.   Yes [provider]  famotidine (PEPCID) 10 MG tablet Take 10 mg by mouth 2 (two) times daily.   Yes [provider]  furosemide (LASIX) 40 MG tablet Take 1 tablet (40 mg total) by mouth daily. Patient taking differently: Take 20 mg by mouth daily. 12/27/20 01/26/21 Yes Nolberto Hanlon, MD  gabapentin (NEURONTIN) 400 MG capsule Take 400 mg by mouth 3 (three) times daily. Pt takes 400 mg once daily   Yes [provider]  levothyroxine (SYNTHROID) 88 MCG tablet Take 88 mcg by mouth daily before breakfast.   Yes [provider]  loperamide (IMODIUM A-D) 2 MG tablet Take 2 mg by mouth 4 (four) times daily as needed for diarrhea or loose stools.   Yes [provider]  metoprolol succinate (TOPROL-XL) 25 MG 24 hr tablet Take 0.5 tablets (12.5 mg total) by mouth daily. Patient taking differently: Take 25 mg by mouth daily. 12/27/20 01/26/21 Yes Nolberto Hanlon, MD  Probiotic Product (PROBIOTIC ADVANCED PO) Take by mouth daily.   Yes [provider]  spironolactone (ALDACTONE) 25 MG tablet Take 1 tablet (25 mg total) by mouth daily. 12/27/20 01/26/21 Yes Nolberto Hanlon, MD  ticagrelor (BRILINTA) 90 MG TABS tablet Take 1 tablet (90 mg total) by mouth 2 (two) times daily. 12/26/20 01/25/21 Yes Nolberto Hanlon, MD  triamcinolone (KENALOG) 0.025 % cream Apply 1 application topically 2 (two) times daily.   Yes [provider]  buPROPion (ZYBAN) 150 MG 12 hr tablet Take 150 mg by mouth 2 (two) times daily.    [provider]    Review of Systems  Constitutional: Positive for fatigue (easily). Negative for appetite change.  HENT: Negative for congestion, postnasal drip and sore throat.   Eyes: Negative.   Respiratory: Positive for chest tightness and shortness of breath. Negative for cough.   Cardiovascular: Negative for chest pain, palpitations and leg  swelling.  Gastrointestinal: Negative for abdominal distention and abdominal pain.  Endocrine: Negative.   Genitourinary: Negative.   Musculoskeletal: Negative for back pain and neck pain.  Skin: Negative.   Allergic/Immunologic: Negative.   Neurological: Negative for dizziness and light-headedness.  Hematological: Negative for adenopathy. Bruises/bleeds easily.  Psychiatric/Behavioral: Negative for dysphoric mood and sleep disturbance (sleeping on 1 pillow). The patient is not nervous/anxious.     Vitals:   01/13/21 1333  BP: 140/79  Pulse: 71  Resp: 20  SpO2: 100%  Weight: 161 lb 2 oz (73.1 kg)  Height: 5' 4"  (1.626 m)   Wt Readings from Last 3 Encounters:  01/13/21 161 lb 2 oz (73.1 kg)  12/26/20 163 lb 11.2 oz (74.3 kg)   Lab Results  Component Value Date   CREATININE 1.03 (H) 12/26/2020   CREATININE 0.93 12/25/2020   CREATININE 0.70 12/24/2020   Physical Exam Vitals and nursing note reviewed. Exam conducted with a chaperone present (husband).  Constitutional:  Appearance: She is well-developed.  HENT:     Head: Normocephalic and atraumatic.  Neck:     Vascular: No JVD.  Cardiovascular:     Rate and Rhythm: Normal rate and regular rhythm.  Pulmonary:     Effort: Pulmonary effort is normal. No respiratory distress.     Breath sounds: No wheezing or rales.  Abdominal:     Palpations: Abdomen is soft.     Tenderness: There is no abdominal tenderness.  Musculoskeletal:     Cervical back: Neck supple.     Right lower leg: No tenderness. No edema.     Left lower leg: No tenderness. No edema.  Skin:    General: Skin is warm and dry.  Neurological:     General: No focal deficit present.     Mental Status: She is alert and oriented to person, place, and time.  Psychiatric:        Mood and Affect: Mood normal.        Behavior: Behavior normal.    Assessment & Plan:  1: Chronic heart failure with reduced ejection fraction- - NYHA class III - euvolemic  today - weighing daily; instructed to call for an overnight weight gain of > 2 pounds or a weekly weight gain of > 5 pounds - not adding salt to her food and has been looking at food labels; reviewed the importance of following a low sodium diet; written dietary information and low sodium cookbook were given to her - she says that lisinopril has been stopped due to a cough and she's waiting for her pharmacy to get her losartan/ HCTZ; consider changing it to entresto if EF remains low - BNP 01/02/21 was 184  2: HTN- - BP mildly elevated today - saw [facility] primary care 01/02/21 & returns 02/03/21 - BMP 01/02/21 reviewed and showed sodium 135, potassium 4.4, creatinine 1.42 and GFR 36  3: CAD/ post STEMI- - says that she saw cardiology [facility]) 01/09/21 - cardiac rehab referral placed; advised her to call them if she hasn't heard from them in ~ 1 week   Patient did not bring her medications nor a list. Each medication was verbally reviewed with the patient and she was encouraged to bring the bottles to every visit to confirm accuracy of list.  Due to HF stability and patient preference, will not make a return appointment for patient at this time. Advised patient that she could call back at anytime to schedule another appointment and she was comfortable with that plan.

## 2021-01-13 ENCOUNTER — Other Ambulatory Visit: Payer: Self-pay

## 2021-01-13 ENCOUNTER — Ambulatory Visit: Payer: Medicare Other | Attending: Family | Admitting: Family

## 2021-01-13 ENCOUNTER — Encounter: Payer: Self-pay | Admitting: Family

## 2021-01-13 VITALS — BP 140/79 | HR 71 | Resp 20 | Ht 64.0 in | Wt 161.1 lb

## 2021-01-13 DIAGNOSIS — Z7982 Long term (current) use of aspirin: Secondary | ICD-10-CM | POA: Diagnosis not present

## 2021-01-13 DIAGNOSIS — Z955 Presence of coronary angioplasty implant and graft: Secondary | ICD-10-CM | POA: Diagnosis not present

## 2021-01-13 DIAGNOSIS — I252 Old myocardial infarction: Secondary | ICD-10-CM | POA: Insufficient documentation

## 2021-01-13 DIAGNOSIS — I2510 Atherosclerotic heart disease of native coronary artery without angina pectoris: Secondary | ICD-10-CM | POA: Insufficient documentation

## 2021-01-13 DIAGNOSIS — I10 Essential (primary) hypertension: Secondary | ICD-10-CM

## 2021-01-13 DIAGNOSIS — I110 Hypertensive heart disease with heart failure: Secondary | ICD-10-CM | POA: Insufficient documentation

## 2021-01-13 DIAGNOSIS — I5022 Chronic systolic (congestive) heart failure: Secondary | ICD-10-CM | POA: Insufficient documentation

## 2021-01-13 DIAGNOSIS — I2102 ST elevation (STEMI) myocardial infarction involving left anterior descending coronary artery: Secondary | ICD-10-CM

## 2021-01-13 DIAGNOSIS — Z79899 Other long term (current) drug therapy: Secondary | ICD-10-CM | POA: Insufficient documentation

## 2021-01-13 NOTE — Patient Instructions (Addendum)
Continue weighing daily and call for an overnight weight gain of > 2 pounds or a weekly weight gain of >5 pounds.   Cardiac rehab ([facility]) referral placed. Call them next week if you haven't heard from them   Call us in the future if you'd like to schedule another appointment

## 2021-01-15 ENCOUNTER — Encounter: Payer: Medicare Other | Attending: Internal Medicine | Admitting: *Deleted

## 2021-01-15 ENCOUNTER — Other Ambulatory Visit: Payer: Self-pay

## 2021-01-15 DIAGNOSIS — Z955 Presence of coronary angioplasty implant and graft: Secondary | ICD-10-CM

## 2021-01-15 DIAGNOSIS — I213 ST elevation (STEMI) myocardial infarction of unspecified site: Secondary | ICD-10-CM

## 2021-01-15 NOTE — Progress Notes (Signed)
Initial telephone orientation completed. Diagnosis can be found in CHL 1/2. EP orientation scheduled for 2/2 at 11am.

## 2021-01-22 ENCOUNTER — Other Ambulatory Visit: Payer: Self-pay

## 2021-01-22 ENCOUNTER — Encounter: Payer: Medicare Other | Attending: Internal Medicine

## 2021-01-22 VITALS — Ht 64.5 in | Wt 160.7 lb

## 2021-01-22 DIAGNOSIS — Z955 Presence of coronary angioplasty implant and graft: Secondary | ICD-10-CM | POA: Diagnosis not present

## 2021-01-22 DIAGNOSIS — I213 ST elevation (STEMI) myocardial infarction of unspecified site: Secondary | ICD-10-CM | POA: Insufficient documentation

## 2021-01-22 NOTE — Progress Notes (Signed)
Cardiac Individual Treatment Plan  Patient Details  Name: Karen Madden MRN: [id number] Date of Birth: September 14, 1946 Referring Provider:   Flowsheet Row Cardiac Rehab from 01/22/2021 in [facility] Cardiac and Pulmonary Rehab  Referring Provider Lujean Amel MD      Initial Encounter Date:  Flowsheet Row Cardiac Rehab from 01/22/2021 in [facility] Cardiac and Pulmonary Rehab  Date 01/22/21      Visit Diagnosis: ST elevation myocardial infarction (STEMI), unspecified artery [facility])  Status post coronary artery stent placement  Patient's Home Medications on Admission:  Current Outpatient Medications:  .  aspirin 81 MG chewable tablet, Chew by mouth daily., Disp: , Rfl:  .  atorvastatin (LIPITOR) 80 MG tablet, Take 1 tablet (80 mg total) by mouth daily., Disp: 30 tablet, Rfl: 1 .  buPROPion (WELLBUTRIN XL) 150 MG 24 hr tablet, Take 150 mg by mouth daily., Disp: , Rfl:  .  buPROPion (ZYBAN) 150 MG 12 hr tablet, Take 150 mg by mouth 2 (two) times daily., Disp: , Rfl:  .  cholecalciferol (VITAMIN D3) 10 MCG (400 UNIT) TABS tablet, Take 1,000 Units by mouth daily., Disp: , Rfl:  .  diphenhydrAMINE (SIMPLY SLEEP) 25 MG tablet, Take 25 mg by mouth at bedtime as needed for sleep., Disp: , Rfl:  .  ezetimibe (ZETIA) 10 MG tablet, Take 10 mg by mouth daily., Disp: , Rfl:  .  famotidine (PEPCID) 10 MG tablet, Take 10 mg by mouth 2 (two) times daily., Disp: , Rfl:  .  furosemide (LASIX) 40 MG tablet, Take 1 tablet (40 mg total) by mouth daily. (Patient taking differently: Take 20 mg by mouth daily.), Disp: 30 tablet, Rfl: 0 .  gabapentin (NEURONTIN) 400 MG capsule, Take 400 mg by mouth 3 (three) times daily. Pt takes 400 mg once daily, Disp: , Rfl:  .  levothyroxine (SYNTHROID) 88 MCG tablet, Take 88 mcg by mouth daily before breakfast., Disp: , Rfl:  .  loperamide (IMODIUM A-D) 2 MG tablet, Take 2 mg by mouth 4 (four) times daily as needed for diarrhea or loose stools., Disp: , Rfl:  .  metoprolol succinate  (TOPROL-XL) 25 MG 24 hr tablet, Take 0.5 tablets (12.5 mg total) by mouth daily. (Patient taking differently: Take 25 mg by mouth daily.), Disp: 15 tablet, Rfl: 0 .  Probiotic Product (PROBIOTIC ADVANCED PO), Take by mouth daily., Disp: , Rfl:  .  spironolactone (ALDACTONE) 25 MG tablet, Take 1 tablet (25 mg total) by mouth daily., Disp: 30 tablet, Rfl: 0 .  ticagrelor (BRILINTA) 90 MG TABS tablet, Take 1 tablet (90 mg total) by mouth 2 (two) times daily., Disp: 60 tablet, Rfl: 1 .  triamcinolone (KENALOG) 0.025 % cream, Apply 1 application topically 2 (two) times daily., Disp: , Rfl:   Past Medical History: Past Medical History:  Diagnosis Date  . CHF (congestive heart failure) ([place])   . Coronary artery disease   . Hypertension     Tobacco Use: Social History   Tobacco Use  Smoking Status Never Smoker  Smokeless Tobacco Never Used    Labs: Recent Review Scientist, physiological    Labs for ITP Cardiac and Pulmonary Rehab Latest Ref Rng & Units 12/22/2020   Cholestrol 0 - 200 mg/dL 191   LDLCALC 0 - 99 mg/dL 92   HDL >40 mg/dL 70   Trlycerides <150 mg/dL 144   Hemoglobin A1c 4.8 - 5.6 % 5.7(H)       Exercise Target Goals: Exercise Program Goal: Individual exercise prescription set using  results from initial 6 min walk test and THRR while considering  patient's activity barriers and safety.   Exercise Prescription Goal: Initial exercise prescription builds to 30-45 minutes a day of aerobic activity, 2-3 days per week.  Home exercise guidelines will be given to patient during program as part of exercise prescription that the participant will acknowledge.   Education: Aerobic Exercise: - Group verbal and visual presentation on the components of exercise prescription. Introduces F.I.T.T principle from ACSM for exercise prescriptions.  Reviews F.I.T.T. principles of aerobic exercise including progression. Written material given at graduation.   Education: Resistance Exercise: - Group  verbal and visual presentation on the components of exercise prescription. Introduces F.I.T.T principle from ACSM for exercise prescriptions  Reviews F.I.T.T. principles of resistance exercise including progression. Written material given at graduation.    Education: Exercise & Equipment Safety: - Individual verbal instruction and demonstration of equipment use and safety with use of the equipment. Flowsheet Row Cardiac Rehab from 01/22/2021 in [facility] Cardiac and Pulmonary Rehab  Education need identified 01/22/21  Date 01/22/21  Educator [place]  Instruction Review Code 1- Verbalizes Understanding      Education: Exercise Physiology & General Exercise Guidelines: - Group verbal and written instruction with models to review the exercise physiology of the cardiovascular system and associated critical values. Provides general exercise guidelines with specific guidelines to those with heart or lung disease.    Education: Flexibility, Balance, Mind/Body Relaxation: - Group verbal and visual presentation with interactive activity on the components of exercise prescription. Introduces F.I.T.T principle from ACSM for exercise prescriptions. Reviews F.I.T.T. principles of flexibility and balance exercise training including progression. Also discusses the mind body connection.  Reviews various relaxation techniques to help reduce and manage stress (i.e. Deep breathing, progressive muscle relaxation, and visualization). Balance handout provided to take home. Written material given at graduation.   Activity Barriers & Risk Stratification:  Activity Barriers & Cardiac Risk Stratification - 01/22/21 1305      Activity Barriers & Cardiac Risk Stratification   Activity Barriers Back Problems;Balance Concerns;Other (comment)    Comments Back problems related to hx of colon CA (missing part of colon); has completed PT for balance issues- dizzy at times due to medications    Cardiac Risk Stratification High            6 Minute Walk:  6 Minute Walk    Row Name 01/22/21 1257         6 Minute Walk   Phase Initial     Distance 915 feet     Walk Time 5 minutes  break 2:49-3:45     # of Rest Breaks 1     MPH 2.07     METS 1.7     RPE 11     Perceived Dyspnea  1     VO2 Peak 5.95     Symptoms Yes (comment)     Comments Legs fatigued     Resting HR 70 bpm     Resting BP 100/64     Resting Oxygen Saturation  96 %     Exercise Oxygen Saturation  during 6 min walk 99 %     Max Ex. HR 87 bpm     Max Ex. BP 116/64     2 Minute Post BP 98/62            Oxygen Initial Assessment:   Oxygen Re-Evaluation:   Oxygen Discharge (Final Oxygen Re-Evaluation):   Initial Exercise Prescription:  Initial Exercise  Prescription - 01/22/21 1200      Date of Initial Exercise RX and Referring Provider   Date 01/22/21    Referring Provider Lujean Amel MD      Recumbant Bike   Level 1    RPM 60    Watts 10    Minutes 15    METs 1.7      NuStep   Level 1    SPM 80    Minutes 15    METs 1.7      T5 Nustep   Level 1    SPM 80    Minutes 15    METs 1.7      Prescription Details   Frequency (times per week) 3    Duration Progress to 30 minutes of continuous aerobic without signs/symptoms of physical distress      Intensity   THRR 40-80% of Max Heartrate 100-130    Ratings of Perceived Exertion 11-13    Perceived Dyspnea 0-4      Progression   Progression Continue to progress workloads to maintain intensity without signs/symptoms of physical distress.      Resistance Training   Training Prescription Yes    Weight 3 lb    Reps 10-15           Perform Capillary Blood Glucose checks as needed.  Exercise Prescription Changes:  Exercise Prescription Changes    Row Name 01/22/21 1300             Response to Exercise   Blood Pressure (Admit) 100/64       Blood Pressure (Exercise) 116/64       Blood Pressure (Exit) 98/62       Heart Rate (Admit) 70 bpm       Heart  Rate (Exercise) 87 bpm       Heart Rate (Exit) 67 bpm       Oxygen Saturation (Admit) 96 %       Oxygen Saturation (Exercise) 99 %       Oxygen Saturation (Exit) 98 %       Rating of Perceived Exertion (Exercise) 11       Perceived Dyspnea (Exercise) 1       Symptoms legs fatigued       Comments walk test results               Resistance Training   Training Prescription Yes       Weight 3 lb       Reps 10-15               Recumbant Bike   Level 1       RPM 60       Minutes 15       METs 1.7               NuStep   Level 1       SPM 80       Minutes 15       METs 1.7               T5 Nustep   Level 1       SPM 80       Minutes 15       METs 1.7              Exercise Comments:   Exercise Goals and Review:  Exercise Goals    Row Name 01/22/21 [phone]  Exercise Goals   Increase Physical Activity Yes       Intervention Provide advice, education, support and counseling about physical activity/exercise needs.;Develop an individualized exercise prescription for aerobic and resistive training based on initial evaluation findings, risk stratification, comorbidities and participant's personal goals.       Expected Outcomes Short Term: Attend rehab on a regular basis to increase amount of physical activity.;Long Term: Add in home exercise to make exercise part of routine and to increase amount of physical activity.;Long Term: Exercising regularly at least 3-5 days a week.       Increase Strength and Stamina Yes       Intervention Provide advice, education, support and counseling about physical activity/exercise needs.;Develop an individualized exercise prescription for aerobic and resistive training based on initial evaluation findings, risk stratification, comorbidities and participant's personal goals.       Expected Outcomes Short Term: Increase workloads from initial exercise prescription for resistance, speed, and METs.;Short Term: Perform resistance training  exercises routinely during rehab and add in resistance training at home;Long Term: Improve cardiorespiratory fitness, muscular endurance and strength as measured by increased METs and functional capacity (6MWT)       Able to understand and use rate of perceived exertion (RPE) scale Yes       Intervention Provide education and explanation on how to use RPE scale       Expected Outcomes Short Term: Able to use RPE daily in rehab to express subjective intensity level;Long Term:  Able to use RPE to guide intensity level when exercising independently       Able to understand and use Dyspnea scale Yes       Intervention Provide education and explanation on how to use Dyspnea scale       Expected Outcomes Short Term: Able to use Dyspnea scale daily in rehab to express subjective sense of shortness of breath during exertion;Long Term: Able to use Dyspnea scale to guide intensity level when exercising independently       Knowledge and understanding of Target Heart Rate Range (THRR) Yes       Intervention Provide education and explanation of THRR including how the numbers were predicted and where they are located for reference       Expected Outcomes Short Term: Able to state/look up THRR;Short Term: Able to use daily as guideline for intensity in rehab;Long Term: Able to use THRR to govern intensity when exercising independently       Able to check pulse independently Yes       Intervention Provide education and demonstration on how to check pulse in carotid and radial arteries.;Review the importance of being able to check your own pulse for safety during independent exercise       Expected Outcomes Short Term: Able to explain why pulse checking is important during independent exercise;Long Term: Able to check pulse independently and accurately       Understanding of Exercise Prescription Yes       Intervention Provide education, explanation, and written materials on patient's individual exercise prescription        Expected Outcomes Short Term: Able to explain program exercise prescription;Long Term: Able to explain home exercise prescription to exercise independently              Exercise Goals Re-Evaluation :   Discharge Exercise Prescription (Final Exercise Prescription Changes):  Exercise Prescription Changes - 01/22/21 1300      Response to Exercise   Blood Pressure (Admit) 100/64  Blood Pressure (Exercise) 116/64    Blood Pressure (Exit) 98/62    Heart Rate (Admit) 70 bpm    Heart Rate (Exercise) 87 bpm    Heart Rate (Exit) 67 bpm    Oxygen Saturation (Admit) 96 %    Oxygen Saturation (Exercise) 99 %    Oxygen Saturation (Exit) 98 %    Rating of Perceived Exertion (Exercise) 11    Perceived Dyspnea (Exercise) 1    Symptoms legs fatigued    Comments walk test results      Resistance Training   Training Prescription Yes    Weight 3 lb    Reps 10-15      Recumbant Bike   Level 1    RPM 60    Minutes 15    METs 1.7      NuStep   Level 1    SPM 80    Minutes 15    METs 1.7      T5 Nustep   Level 1    SPM 80    Minutes 15    METs 1.7           Nutrition:  Target Goals: Understanding of nutrition guidelines, daily intake of sodium 1500mg , cholesterol 200mg , calories 30% from fat and 7% or less from saturated fats, daily to have 5 or more servings of fruits and vegetables.  Education: All About Nutrition: -Group instruction provided by verbal, written material, interactive activities, discussions, models, and posters to present general guidelines for heart healthy nutrition including fat, fiber, MyPlate, the role of sodium in heart healthy nutrition, utilization of the nutrition label, and utilization of this knowledge for meal planning. Follow up email sent as well. Written material given at graduation.   Biometrics:  Pre Biometrics - 01/22/21 1307      Pre Biometrics   Height 5' 4.5" (1.638 m)    Weight 160 lb 11.2 oz (72.9 kg)    BMI (Calculated)  27.17    Single Leg Stand 9.3 seconds            Nutrition Therapy Plan and Nutrition Goals:   Nutrition Assessments:  MEDIFICTS Score Key:  ?70 Need to make dietary changes   40-70 Heart Healthy Diet  ? 40 Therapeutic Level Cholesterol Diet   Picture Your Plate Scores:  [id number] Unhealthy dietary pattern with much room for improvement.  41-50 Dietary pattern unlikely to meet recommendations for good health and room for improvement.  51-60 More healthful dietary pattern, with some room for improvement.   >60 Healthy dietary pattern, although there may be some specific behaviors that could be improved.    Nutrition Goals Re-Evaluation:   Nutrition Goals Discharge (Final Nutrition Goals Re-Evaluation):   Psychosocial: Target Goals: Acknowledge presence or absence of significant depression and/or stress, maximize coping skills, provide positive support system. Participant is able to verbalize types and ability to use techniques and skills needed for reducing stress and depression.   Education: Stress, Anxiety, and Depression - Group verbal and visual presentation to define topics covered.  Reviews how body is impacted by stress, anxiety, and depression.  Also discusses healthy ways to reduce stress and to treat/manage anxiety and depression.  Written material given at graduation.   Education: Sleep Hygiene -Provides group verbal and written instruction about how sleep can affect your health.  Define sleep hygiene, discuss sleep cycles and impact of sleep habits. Review good sleep hygiene tips.    Initial Review & Psychosocial Screening:  Initial Psych Review &  Screening - 01/15/21 1043      Initial Review   Current issues with Current Sleep Concerns;Current Stress Concerns    Source of Stress Concerns Chronic Illness      Family Dynamics   Good Support System? Yes   husband, 3 children live close     Barriers   Psychosocial barriers to participate in program There  are no identifiable barriers or psychosocial needs.      Screening Interventions   Interventions Encouraged to exercise;Provide feedback about the scores to participant;To provide support and resources with identified psychosocial needs    Expected Outcomes Short Term goal: Utilizing psychosocial counselor, staff and physician to assist with identification of specific Stressors or current issues interfering with healing process. Setting desired goal for each stressor or current issue identified.;Long Term Goal: Stressors or current issues are controlled or eliminated.;Short Term goal: Identification and review with participant of any Quality of Life or Depression concerns found by scoring the questionnaire.;Long Term goal: The participant improves quality of Life and PHQ9 Scores as seen by post scores and/or verbalization of changes           Quality of Life Scores:   Quality of Life - 01/22/21 1317      Quality of Life   Select Quality of Life      Quality of Life Scores   Health/Function Pre 19.8 %    Socioeconomic Pre 24.38 %    Psych/Spiritual Pre 26.57 %    Family Pre 30 %    GLOBAL Pre 23.66 %          Scores of 19 and below usually indicate a poorer quality of life in these areas.  A difference of  2-3 points is a clinically meaningful difference.  A difference of 2-3 points in the total score of the Quality of Life Index has been associated with significant improvement in overall quality of life, self-image, physical symptoms, and general health in studies assessing change in quality of life.  PHQ-9: Recent Review Flowsheet Data    Depression screen [facility] 2/9 01/22/2021   Decreased Interest 0   Down, Depressed, Hopeless 0   PHQ - 2 Score 0   Altered sleeping 1   Tired, decreased energy 1   Change in appetite 0   Feeling bad or failure about yourself  0   Trouble concentrating 0   Moving slowly or fidgety/restless 0   Suicidal thoughts 0   PHQ-9 Score 2   Difficult doing  work/chores Not difficult at all     Interpretation of Total Score  Total Score Depression Severity:  1-4 = Minimal depression, 5-9 = Mild depression, 10-14 = Moderate depression, 15-19 = Moderately severe depression, 20-27 = Severe depression   Psychosocial Evaluation and Intervention:  Psychosocial Evaluation - 01/15/21 1100      Psychosocial Evaluation & Interventions   Comments Mrs. Baldner reports doing well after her STEMI and Covid. She feels like she is over Covid and didn't even know she had it at the time. She still has some minor chest discomfort but nothing her MD is worried about. She is on Wellbutrin and reports it is still working. She states her husband and 3 local children are very supportive, one of her daughters comes over and cooks low sodium meals as well. Her sleep pattern hasnt changed after her MI and is still requiring some medication to help her stay asleep. Her balance is still an issue (she's completed PT in the past) and  hopes to work on it while she is here. She does have some back pain related to history of colon cancer and bowel surgery. She is motivated to start cardiac rehab to help with strength and stamina    Expected Outcomes Short: attend cardiac rehab for education and exercise. Long: develop and maintain positive self care habits.    Continue Psychosocial Services  Follow up required by staff           Psychosocial Re-Evaluation:   Psychosocial Discharge (Final Psychosocial Re-Evaluation):   Vocational Rehabilitation: Provide vocational rehab assistance to qualifying candidates.   Vocational Rehab Evaluation & Intervention:  Vocational Rehab - 01/15/21 1043      Initial Vocational Rehab Evaluation & Intervention   Assessment shows need for Vocational Rehabilitation No           Education: Education Goals: Education classes will be provided on a variety of topics geared toward better understanding of heart health and risk factor modification.  Participant will state understanding/return demonstration of topics presented as noted by education test scores.  Learning Barriers/Preferences:  Learning Barriers/Preferences - 01/15/21 1043      Learning Barriers/Preferences   Learning Barriers None    Learning Preferences None           General Cardiac Education Topics:  AED/CPR: - Group verbal and written instruction with the use of models to demonstrate the basic use of the AED with the basic ABC's of resuscitation.   Anatomy and Cardiac Procedures: - Group verbal and visual presentation and models provide information about basic cardiac anatomy and function. Reviews the testing methods done to diagnose heart disease and the outcomes of the test results. Describes the treatment choices: Medical Management, Angioplasty, or Coronary Bypass Surgery for treating various heart conditions including Myocardial Infarction, Angina, Valve Disease, and Cardiac Arrhythmias.  Written material given at graduation.   Medication Safety: - Group verbal and visual instruction to review commonly prescribed medications for heart and lung disease. Reviews the medication, class of the drug, and side effects. Includes the steps to properly store meds and maintain the prescription regimen.  Written material given at graduation.   Intimacy: - Group verbal instruction through game format to discuss how heart and lung disease can affect sexual intimacy. Written material given at graduation..   Know Your Numbers and Heart Failure: - Group verbal and visual instruction to discuss disease risk factors for cardiac and pulmonary disease and treatment options.  Reviews associated critical values for Overweight/Obesity, Hypertension, Cholesterol, and Diabetes.  Discusses basics of heart failure: signs/symptoms and treatments.  Introduces Heart Failure Zone chart for action plan for heart failure.  Written material given at graduation.   Infection  Prevention: - Provides verbal and written material to individual with discussion of infection control including proper hand washing and proper equipment cleaning during exercise session. Flowsheet Row Cardiac Rehab from 01/22/2021 in [facility] Cardiac and Pulmonary Rehab  Education need identified 01/22/21  Date 01/22/21  Educator [place]  Instruction Review Code 1- Verbalizes Understanding      Falls Prevention: - Provides verbal and written material to individual with discussion of falls prevention and safety. Flowsheet Row Cardiac Rehab from 01/22/2021 in [facility] Cardiac and Pulmonary Rehab  Education need identified 01/22/21  Date 01/22/21  Educator [place]  Instruction Review Code 1- Verbalizes Understanding      Other: -Provides group and verbal instruction on various topics (see comments)   Knowledge Questionnaire Score:  Knowledge Questionnaire Score - 01/22/21 1317  Knowledge Questionnaire Score   Pre Score 21/26: Angina, A&P, exercise, nutrition           Core Components/Risk Factors/Patient Goals at Admission:  Personal Goals and Risk Factors at Admission - 01/22/21 1308      Core Components/Risk Factors/Patient Goals on Admission    Weight Management Yes;Weight Loss    Intervention Weight Management: Develop a combined nutrition and exercise program designed to reach desired caloric intake, while maintaining appropriate intake of nutrient and fiber, sodium and fats, and appropriate energy expenditure required for the weight goal.;Weight Management: Provide education and appropriate resources to help participant work on and attain dietary goals.;Weight Management/Obesity: Establish reasonable short term and long term weight goals.    Admit Weight 160 lb (72.6 kg)    Goal Weight: Short Term 157 lb (71.2 kg)    Goal Weight: Long Term 153 lb (69.4 kg)    Expected Outcomes Short Term: Continue to assess and modify interventions until short term weight is achieved;Long Term: Adherence  to nutrition and physical activity/exercise program aimed toward attainment of established weight goal;Weight Loss: Understanding of general recommendations for a balanced deficit meal plan, which promotes 1-2 lb weight loss per week and includes a negative energy balance of [phone] kcal/d;Understanding recommendations for meals to include 15-35% energy as protein, 25-35% energy from fat, 35-60% energy from carbohydrates, less than 200mg  of dietary cholesterol, 20-35 gm of total fiber daily;Understanding of distribution of calorie intake throughout the day with the consumption of 4-5 meals/snacks    Heart Failure Yes    Intervention Provide a combined exercise and nutrition program that is supplemented with education, support and counseling about heart failure. Directed toward relieving symptoms such as shortness of breath, decreased exercise tolerance, and extremity edema.    Expected Outcomes Improve functional capacity of life;Short term: Attendance in program 2-3 days a week with increased exercise capacity. Reported lower sodium intake. Reported increased fruit and vegetable intake. Reports medication compliance.;Short term: Daily weights obtained and reported for increase. Utilizing diuretic protocols set by physician.;Long term: Adoption of self-care skills and reduction of barriers for early signs and symptoms recognition and intervention leading to self-care maintenance.    Hypertension Yes    Intervention Provide education on lifestyle modifcations including regular physical activity/exercise, weight management, moderate sodium restriction and increased consumption of fresh fruit, vegetables, and low fat dairy, alcohol moderation, and smoking cessation.;Monitor prescription use compliance.    Expected Outcomes Short Term: Continued assessment and intervention until BP is < 140/40mm HG in hypertensive participants. < 130/42mm HG in hypertensive participants with diabetes, heart failure or chronic  kidney disease.;Long Term: Maintenance of blood pressure at goal levels.    Lipids Yes    Intervention Provide education and support for participant on nutrition & aerobic/resistive exercise along with prescribed medications to achieve LDL 70mg , HDL >40mg .    Expected Outcomes Short Term: Participant states understanding of desired cholesterol values and is compliant with medications prescribed. Participant is following exercise prescription and nutrition guidelines.;Long Term: Cholesterol controlled with medications as prescribed, with individualized exercise RX and with personalized nutrition plan. Value goals: LDL < 70mg , HDL > 40 mg.           Education:Diabetes - Individual verbal and written instruction to review signs/symptoms of diabetes, desired ranges of glucose level fasting, after meals and with exercise. Acknowledge that pre and post exercise glucose checks will be done for 3 sessions at entry of program.   Core Components/Risk Factors/Patient Goals Review:  Core Components/Risk Factors/Patient Goals at Discharge (Final Review):    ITP Comments:  ITP Comments    Row Name 01/15/21 1034 01/22/21 1242         ITP Comments Initial telephone orientation completed. Diagnosis can be found in [facility] 1/2. EP orientation scheduled for 2/2 at 11am. Completed 6MWT and gym orientation. Initial ITP created and sent for review to Dr. Emily Filbert, Medical Director.             Comments: Initial ITP

## 2021-01-22 NOTE — Patient Instructions (Signed)
Patient Instructions  Patient Details  Name: Karen Madden MRN: [id number] Date of Birth: 1946-02-02 Referring Provider:  Yolonda Kida, MD  Below are your personal goals for exercise, nutrition, and risk factors. Our goal is to help you stay on track towards obtaining and maintaining these goals. We will be discussing your progress on these goals with you throughout the program.  Initial Exercise Prescription:  Initial Exercise Prescription - 01/22/21 1200      Date of Initial Exercise RX and Referring Provider   Date 01/22/21    Referring Provider Lujean Amel MD      Recumbant Bike   Level 1    RPM 60    Watts 10    Minutes 15    METs 1.7      NuStep   Level 1    SPM 80    Minutes 15    METs 1.7      T5 Nustep   Level 1    SPM 80    Minutes 15    METs 1.7      Prescription Details   Frequency (times per week) 3    Duration Progress to 30 minutes of continuous aerobic without signs/symptoms of physical distress      Intensity   THRR 40-80% of Max Heartrate 100-130    Ratings of Perceived Exertion 11-13    Perceived Dyspnea 0-4      Progression   Progression Continue to progress workloads to maintain intensity without signs/symptoms of physical distress.      Resistance Training   Training Prescription Yes    Weight 3 lb    Reps 10-15           Exercise Goals: Frequency: Be able to perform aerobic exercise two to three times per week in program working toward 2-5 days per week of home exercise.  Intensity: Work with a perceived exertion of 11 (fairly light) - 15 (hard) while following your exercise prescription.  We will make changes to your prescription with you as you progress through the program.   Duration: Be able to do 30 to 45 minutes of continuous aerobic exercise in addition to a 5 minute warm-up and a 5 minute cool-down routine.   Nutrition Goals: Your personal nutrition goals will be established when you do your nutrition analysis  with the dietician.  The following are general nutrition guidelines to follow: Cholesterol < 200mg /day Sodium < 1500mg /day Fiber: Women over 50 yrs - 21 grams per day  Personal Goals:  Personal Goals and Risk Factors at Admission - 01/22/21 1308      Core Components/Risk Factors/Patient Goals on Admission    Weight Management Yes;Weight Loss    Intervention Weight Management: Develop a combined nutrition and exercise program designed to reach desired caloric intake, while maintaining appropriate intake of nutrient and fiber, sodium and fats, and appropriate energy expenditure required for the weight goal.;Weight Management: Provide education and appropriate resources to help participant work on and attain dietary goals.;Weight Management/Obesity: Establish reasonable short term and long term weight goals.    Admit Weight 160 lb (72.6 kg)    Goal Weight: Short Term 157 lb (71.2 kg)    Goal Weight: Long Term 153 lb (69.4 kg)    Expected Outcomes Short Term: Continue to assess and modify interventions until short term weight is achieved;Long Term: Adherence to nutrition and physical activity/exercise program aimed toward attainment of established weight goal;Weight Loss: Understanding of general recommendations for a balanced deficit meal  plan, which promotes 1-2 lb weight loss per week and includes a negative energy balance of [phone] kcal/d;Understanding recommendations for meals to include 15-35% energy as protein, 25-35% energy from fat, 35-60% energy from carbohydrates, less than 200mg  of dietary cholesterol, 20-35 gm of total fiber daily;Understanding of distribution of calorie intake throughout the day with the consumption of 4-5 meals/snacks    Heart Failure Yes    Intervention Provide a combined exercise and nutrition program that is supplemented with education, support and counseling about heart failure. Directed toward relieving symptoms such as shortness of breath, decreased exercise  tolerance, and extremity edema.    Expected Outcomes Improve functional capacity of life;Short term: Attendance in program 2-3 days a week with increased exercise capacity. Reported lower sodium intake. Reported increased fruit and vegetable intake. Reports medication compliance.;Short term: Daily weights obtained and reported for increase. Utilizing diuretic protocols set by physician.;Long term: Adoption of self-care skills and reduction of barriers for early signs and symptoms recognition and intervention leading to self-care maintenance.    Hypertension Yes    Intervention Provide education on lifestyle modifcations including regular physical activity/exercise, weight management, moderate sodium restriction and increased consumption of fresh fruit, vegetables, and low fat dairy, alcohol moderation, and smoking cessation.;Monitor prescription use compliance.    Expected Outcomes Short Term: Continued assessment and intervention until BP is < 140/70mm HG in hypertensive participants. < 130/23mm HG in hypertensive participants with diabetes, heart failure or chronic kidney disease.;Long Term: Maintenance of blood pressure at goal levels.    Lipids Yes    Intervention Provide education and support for participant on nutrition & aerobic/resistive exercise along with prescribed medications to achieve LDL 70mg , HDL >40mg .    Expected Outcomes Short Term: Participant states understanding of desired cholesterol values and is compliant with medications prescribed. Participant is following exercise prescription and nutrition guidelines.;Long Term: Cholesterol controlled with medications as prescribed, with individualized exercise RX and with personalized nutrition plan. Value goals: LDL < 70mg , HDL > 40 mg.           Tobacco Use Initial Evaluation: Social History   Tobacco Use  Smoking Status Never Smoker  Smokeless Tobacco Never Used    Exercise Goals and Review:  Exercise Goals    Row Name  01/22/21 1305             Exercise Goals   Increase Physical Activity Yes       Intervention Provide advice, education, support and counseling about physical activity/exercise needs.;Develop an individualized exercise prescription for aerobic and resistive training based on initial evaluation findings, risk stratification, comorbidities and participant's personal goals.       Expected Outcomes Short Term: Attend rehab on a regular basis to increase amount of physical activity.;Long Term: Add in home exercise to make exercise part of routine and to increase amount of physical activity.;Long Term: Exercising regularly at least 3-5 days a week.       Increase Strength and Stamina Yes       Intervention Provide advice, education, support and counseling about physical activity/exercise needs.;Develop an individualized exercise prescription for aerobic and resistive training based on initial evaluation findings, risk stratification, comorbidities and participant's personal goals.       Expected Outcomes Short Term: Increase workloads from initial exercise prescription for resistance, speed, and METs.;Short Term: Perform resistance training exercises routinely during rehab and add in resistance training at home;Long Term: Improve cardiorespiratory fitness, muscular endurance and strength as measured by increased METs and functional capacity (6MWT)  Able to understand and use rate of perceived exertion (RPE) scale Yes       Intervention Provide education and explanation on how to use RPE scale       Expected Outcomes Short Term: Able to use RPE daily in rehab to express subjective intensity level;Long Term:  Able to use RPE to guide intensity level when exercising independently       Able to understand and use Dyspnea scale Yes       Intervention Provide education and explanation on how to use Dyspnea scale       Expected Outcomes Short Term: Able to use Dyspnea scale daily in rehab to express  subjective sense of shortness of breath during exertion;Long Term: Able to use Dyspnea scale to guide intensity level when exercising independently       Knowledge and understanding of Target Heart Rate Range (THRR) Yes       Intervention Provide education and explanation of THRR including how the numbers were predicted and where they are located for reference       Expected Outcomes Short Term: Able to state/look up THRR;Short Term: Able to use daily as guideline for intensity in rehab;Long Term: Able to use THRR to govern intensity when exercising independently       Able to check pulse independently Yes       Intervention Provide education and demonstration on how to check pulse in carotid and radial arteries.;Review the importance of being able to check your own pulse for safety during independent exercise       Expected Outcomes Short Term: Able to explain why pulse checking is important during independent exercise;Long Term: Able to check pulse independently and accurately       Understanding of Exercise Prescription Yes       Intervention Provide education, explanation, and written materials on patient's individual exercise prescription       Expected Outcomes Short Term: Able to explain program exercise prescription;Long Term: Able to explain home exercise prescription to exercise independently              Copy of goals given to participant.

## 2021-01-23 ENCOUNTER — Encounter: Payer: Medicare Other | Admitting: *Deleted

## 2021-01-23 ENCOUNTER — Other Ambulatory Visit: Payer: Self-pay

## 2021-01-23 DIAGNOSIS — Z955 Presence of coronary angioplasty implant and graft: Secondary | ICD-10-CM

## 2021-01-23 DIAGNOSIS — I213 ST elevation (STEMI) myocardial infarction of unspecified site: Secondary | ICD-10-CM

## 2021-01-23 NOTE — Progress Notes (Signed)
Daily Session Note  Patient Details  Name: Karen Madden MRN: [id number] Date of Birth: Sep 09, 1946 Referring Provider:   Flowsheet Row Cardiac Rehab from 01/22/2021 in [facility] Cardiac and Pulmonary Rehab  Referring Provider Lujean Amel MD      Encounter Date: 01/23/2021  Check In:  Session Check In - 01/23/21 1354      Check-In   Supervising physician immediately available to respond to emergencies See telemetry face sheet for immediately available ER MD    Location [facility]-Cardiac & Pulmonary Rehab    Staff Present Renita Papa, RN BSN;Joseph [address] [place], [place], RCEP, CCRP, CCET    Virtual Visit No    Medication changes reported     No    Fall or balance concerns reported    No    Warm-up and Cool-down Performed on first and last piece of equipment    Resistance Training Performed Yes    VAD Patient? No    PAD/SET Patient? No      Pain Assessment   Currently in Pain? No/denies              Social History   Tobacco Use  Smoking Status Never Smoker  Smokeless Tobacco Never Used    Goals Met:  Independence with exercise equipment Exercise tolerated well No report of cardiac concerns or symptoms Strength training completed today  Goals Unmet:  Not Applicable  Comments: First full day of exercise!  Patient was oriented to gym and equipment including functions, settings, policies, and procedures.  Patient's individual exercise prescription and treatment plan were reviewed.  All starting workloads were established based on the results of the 6 minute walk test done at initial orientation visit.  The plan for exercise progression was also introduced and progression will be customized based on patient's performance and goals.    Dr. Emily Filbert is Medical Director for [place] and [facility].

## 2021-01-27 ENCOUNTER — Other Ambulatory Visit: Payer: Self-pay

## 2021-01-27 DIAGNOSIS — I213 ST elevation (STEMI) myocardial infarction of unspecified site: Secondary | ICD-10-CM

## 2021-01-27 DIAGNOSIS — Z955 Presence of coronary angioplasty implant and graft: Secondary | ICD-10-CM

## 2021-01-27 NOTE — Progress Notes (Signed)
Daily Session Note  Patient Details  Name: JAYONA MCCAIG MRN: [id number] Date of Birth: [birth year] Referring Provider:   Flowsheet Row Cardiac Rehab from 01/22/2021 in [facility] Cardiac and Pulmonary Rehab  Referring Provider Lujean Amel MD      Encounter Date: 01/27/2021  Check In:  Session Check In - 01/27/21 1359      Check-In   Supervising physician immediately available to respond to emergencies See telemetry face sheet for immediately available ER MD    Location [facility]-Cardiac & Pulmonary Rehab    Staff Present Birdie Sons, MPA, Mauricia Area, BS, ACSM CEP, Exercise Physiologist;Kara Eliezer Bottom, MS Exercise Physiologist    Virtual Visit No    Medication changes reported     No    Fall or balance concerns reported    No    Warm-up and Cool-down Performed on first and last piece of equipment    Resistance Training Performed Yes    VAD Patient? No    PAD/SET Patient? No      Pain Assessment   Currently in Pain? No/denies              Social History   Tobacco Use  Smoking Status Never Smoker  Smokeless Tobacco Never Used    Goals Met:  Independence with exercise equipment Exercise tolerated well No report of cardiac concerns or symptoms Strength training completed today  Goals Unmet:  Not Applicable  Comments: Pt able to follow exercise prescription today without complaint.  Will continue to monitor for progression.    Dr. Emily Filbert is Medical Director for [place] and [facility].

## 2021-01-30 ENCOUNTER — Encounter: Payer: Medicare Other | Admitting: *Deleted

## 2021-01-30 ENCOUNTER — Other Ambulatory Visit: Payer: Self-pay

## 2021-01-30 DIAGNOSIS — I213 ST elevation (STEMI) myocardial infarction of unspecified site: Secondary | ICD-10-CM

## 2021-01-30 DIAGNOSIS — Z955 Presence of coronary angioplasty implant and graft: Secondary | ICD-10-CM

## 2021-01-30 NOTE — Progress Notes (Signed)
Daily Session Note  Patient Details  Name: Karen Madden MRN: [id number] Date of Birth: 02-08-1946 Referring Provider:   Flowsheet Row Cardiac Rehab from 01/22/2021 in [facility] Cardiac and Pulmonary Rehab  Referring Provider Lujean Amel MD      Encounter Date: 01/30/2021  Check In:  Session Check In - 01/30/21 1405      Check-In   Supervising physician immediately available to respond to emergencies See telemetry face sheet for immediately available ER MD    Location [facility]-Cardiac & Pulmonary Rehab    Staff Present Renita Papa, RN BSN;Joseph [address] [place], [place], RCEP, CCRP, CCET    Virtual Visit No    Medication changes reported     No    Fall or balance concerns reported    No    Warm-up and Cool-down Performed on first and last piece of equipment    Resistance Training Performed Yes    VAD Patient? No    PAD/SET Patient? No      Pain Assessment   Currently in Pain? No/denies              Social History   Tobacco Use  Smoking Status Never Smoker  Smokeless Tobacco Never Used    Goals Met:  Independence with exercise equipment Exercise tolerated well No report of cardiac concerns or symptoms Strength training completed today  Goals Unmet:  Not Applicable  Comments: Pt able to follow exercise prescription today without complaint.  Will continue to monitor for progression.   Dr. Emily Filbert is Medical Director for [place] and [facility].

## 2021-02-03 ENCOUNTER — Other Ambulatory Visit: Payer: Self-pay

## 2021-02-03 DIAGNOSIS — I213 ST elevation (STEMI) myocardial infarction of unspecified site: Secondary | ICD-10-CM | POA: Diagnosis not present

## 2021-02-03 DIAGNOSIS — Z955 Presence of coronary angioplasty implant and graft: Secondary | ICD-10-CM

## 2021-02-03 NOTE — Progress Notes (Signed)
Daily Session Note  Patient Details  Name: EVERLEIGH COLCLASURE MRN: [id number] Date of Birth: August 31, 1946 Referring Provider:   Flowsheet Row Cardiac Rehab from 01/22/2021 in [facility] Cardiac and Pulmonary Rehab  Referring Provider Lujean Amel MD      Encounter Date: 02/03/2021  Check In:  Session Check In - 02/03/21 1358      Check-In   Supervising physician immediately available to respond to emergencies See telemetry face sheet for immediately available ER MD    Location [facility]-Cardiac & Pulmonary Rehab    Staff Present Birdie Sons, MPA, Mauricia Area, BS, ACSM CEP, Exercise Physiologist;Kara Eliezer Bottom, MS Exercise Physiologist    Virtual Visit No    Medication changes reported     No    Fall or balance concerns reported    No    Warm-up and Cool-down Performed on first and last piece of equipment    Resistance Training Performed Yes    VAD Patient? No    PAD/SET Patient? No      Pain Assessment   Currently in Pain? No/denies              Social History   Tobacco Use  Smoking Status Never Smoker  Smokeless Tobacco Never Used    Goals Met:  Independence with exercise equipment Exercise tolerated well No report of cardiac concerns or symptoms Strength training completed today  Goals Unmet:  Not Applicable  Comments: Pt able to follow exercise prescription today without complaint.  Will continue to monitor for progression.    Dr. Emily Filbert is Medical Director for [place] and [facility].

## 2021-02-05 ENCOUNTER — Other Ambulatory Visit: Payer: Self-pay

## 2021-02-05 DIAGNOSIS — Z955 Presence of coronary angioplasty implant and graft: Secondary | ICD-10-CM

## 2021-02-05 DIAGNOSIS — I213 ST elevation (STEMI) myocardial infarction of unspecified site: Secondary | ICD-10-CM | POA: Diagnosis not present

## 2021-02-05 NOTE — Progress Notes (Signed)
Daily Session Note  Patient Details  Name: Karen Madden MRN: [id number] Date of Birth: 08/23/1946 Referring Provider:   Flowsheet Row Cardiac Rehab from 01/22/2021 in [facility] Cardiac and Pulmonary Rehab  Referring Provider Lujean Amel MD      Encounter Date: 02/05/2021  Check In:  Session Check In - 02/05/21 1359      Check-In   Supervising physician immediately available to respond to emergencies See telemetry face sheet for immediately available ER MD    Location [facility]-Cardiac & Pulmonary Rehab    Staff Present Birdie Sons, MPA, RN;Joseph Lou [place], [place] Exercise Physiologist    Virtual Visit No    Medication changes reported     No    Fall or balance concerns reported    No    Warm-up and Cool-down Performed on first and last piece of equipment    Resistance Training Performed Yes    VAD Patient? No    PAD/SET Patient? No      Pain Assessment   Currently in Pain? No/denies              Social History   Tobacco Use  Smoking Status Never Smoker  Smokeless Tobacco Never Used    Goals Met:  Independence with exercise equipment Exercise tolerated well No report of cardiac concerns or symptoms Strength training completed today  Goals Unmet:  Not Applicable  Comments: Pt able to follow exercise prescription today without complaint.  Will continue to monitor for progression.    Dr. Emily Filbert is Medical Director for [place] and [facility].

## 2021-02-10 ENCOUNTER — Other Ambulatory Visit: Payer: Self-pay

## 2021-02-10 DIAGNOSIS — I213 ST elevation (STEMI) myocardial infarction of unspecified site: Secondary | ICD-10-CM

## 2021-02-10 DIAGNOSIS — Z955 Presence of coronary angioplasty implant and graft: Secondary | ICD-10-CM

## 2021-02-10 NOTE — Progress Notes (Signed)
Daily Session Note  Patient Details  Name: Karen Madden MRN: [id number] Date of Birth: 01-02-1946 Referring Provider:   Flowsheet Row Cardiac Rehab from 01/22/2021 in [facility] Cardiac and Pulmonary Rehab  Referring Provider Lujean Amel MD      Encounter Date: 02/10/2021  Check In:  Session Check In - 02/10/21 1404      Check-In   Supervising physician immediately available to respond to emergencies See telemetry face sheet for immediately available ER MD    Location [facility]-Cardiac & Pulmonary Rehab    Staff Present Birdie Sons, MPA, Mauricia Area, BS, ACSM CEP, Exercise Physiologist;Kara Eliezer Bottom, MS Exercise Physiologist;Meredith Sherryll Burger, RN BSN    Virtual Visit No    Medication changes reported     No    Fall or balance concerns reported    No    Warm-up and Cool-down Performed on first and last piece of equipment    Resistance Training Performed Yes    VAD Patient? No    PAD/SET Patient? No      Pain Assessment   Currently in Pain? No/denies              Social History   Tobacco Use  Smoking Status Never Smoker  Smokeless Tobacco Never Used    Goals Met:  Independence with exercise equipment Exercise tolerated well No report of cardiac concerns or symptoms Strength training completed today  Goals Unmet:  Not Applicable  Comments: Pt able to follow exercise prescription today without complaint.  Will continue to monitor for progression.    Dr. Emily Filbert is Medical Director for [place] and [facility].

## 2021-02-12 ENCOUNTER — Encounter: Payer: Self-pay | Admitting: *Deleted

## 2021-02-12 ENCOUNTER — Other Ambulatory Visit: Payer: Self-pay

## 2021-02-12 DIAGNOSIS — I213 ST elevation (STEMI) myocardial infarction of unspecified site: Secondary | ICD-10-CM

## 2021-02-12 DIAGNOSIS — Z955 Presence of coronary angioplasty implant and graft: Secondary | ICD-10-CM

## 2021-02-12 NOTE — Progress Notes (Signed)
Daily Session Note  Patient Details  Name: Karen Madden MRN: [id number] Date of Birth: [birth year] Referring Provider:   Flowsheet Row Cardiac Rehab from 01/22/2021 in [facility] Cardiac and Pulmonary Rehab  Referring Provider Lujean Amel MD      Encounter Date: 02/12/2021  Check In:  Session Check In - 02/12/21 1411      Check-In   Supervising physician immediately available to respond to emergencies See telemetry face sheet for immediately available ER MD    Location [facility]-Cardiac & Pulmonary Rehab    Staff Present Birdie Sons, MPA, RN;Joseph Lou [place], [place] Exercise Physiologist    Virtual Visit No    Medication changes reported     No    Fall or balance concerns reported    No    Warm-up and Cool-down Performed on first and last piece of equipment    Resistance Training Performed Yes    VAD Patient? No    PAD/SET Patient? No      Pain Assessment   Currently in Pain? No/denies              Social History   Tobacco Use  Smoking Status Never Smoker  Smokeless Tobacco Never Used    Goals Met:  Independence with exercise equipment Exercise tolerated well No report of cardiac concerns or symptoms Strength training completed today  Goals Unmet:  Not Applicable  Comments: Pt able to follow exercise prescription today without complaint.  Will continue to monitor for progression.  Reviewed home exercise with pt today.  Pt plans to walk and use YouTube videos at home for exercise.  Reviewed THR, pulse, RPE, sign and symptoms, pulse oximetery and when to call 911 or MD.  Also discussed weather considerations and indoor options.  Pt voiced understanding.  Dr. Emily Filbert is Medical Director for [place] and [facility].

## 2021-02-12 NOTE — Progress Notes (Signed)
Cardiac Individual Treatment Plan  Patient Details  Name: Karen Madden MRN: [id number] Date of Birth: Feb 20, 1946 Referring Provider:   Flowsheet Row Cardiac Rehab from 01/22/2021 in [facility] Cardiac and Pulmonary Rehab  Referring Provider Lujean Amel MD      Initial Encounter Date:  Flowsheet Row Cardiac Rehab from 01/22/2021 in [facility] Cardiac and Pulmonary Rehab  Date 01/22/21      Visit Diagnosis: ST elevation myocardial infarction (STEMI), unspecified artery [facility])  Status post coronary artery stent placement  Patient's Home Medications on Admission:  Current Outpatient Medications:  .  aspirin 81 MG chewable tablet, Chew by mouth daily., Disp: , Rfl:  .  atorvastatin (LIPITOR) 80 MG tablet, Take 1 tablet (80 mg total) by mouth daily., Disp: 30 tablet, Rfl: 1 .  buPROPion (WELLBUTRIN XL) 150 MG 24 hr tablet, Take 150 mg by mouth daily., Disp: , Rfl:  .  buPROPion (ZYBAN) 150 MG 12 hr tablet, Take 150 mg by mouth 2 (two) times daily., Disp: , Rfl:  .  cholecalciferol (VITAMIN D3) 10 MCG (400 UNIT) TABS tablet, Take 1,000 Units by mouth daily., Disp: , Rfl:  .  diphenhydrAMINE (SIMPLY SLEEP) 25 MG tablet, Take 25 mg by mouth at bedtime as needed for sleep., Disp: , Rfl:  .  ezetimibe (ZETIA) 10 MG tablet, Take 10 mg by mouth daily., Disp: , Rfl:  .  famotidine (PEPCID) 10 MG tablet, Take 10 mg by mouth 2 (two) times daily., Disp: , Rfl:  .  furosemide (LASIX) 40 MG tablet, Take 1 tablet (40 mg total) by mouth daily. (Patient taking differently: Take 20 mg by mouth daily.), Disp: 30 tablet, Rfl: 0 .  gabapentin (NEURONTIN) 400 MG capsule, Take 400 mg by mouth 3 (three) times daily. Pt takes 400 mg once daily, Disp: , Rfl:  .  levothyroxine (SYNTHROID) 88 MCG tablet, Take 88 mcg by mouth daily before breakfast., Disp: , Rfl:  .  loperamide (IMODIUM A-D) 2 MG tablet, Take 2 mg by mouth 4 (four) times daily as needed for diarrhea or loose stools., Disp: , Rfl:  .  metoprolol succinate  (TOPROL-XL) 25 MG 24 hr tablet, Take 0.5 tablets (12.5 mg total) by mouth daily. (Patient taking differently: Take 25 mg by mouth daily.), Disp: 15 tablet, Rfl: 0 .  Probiotic Product (PROBIOTIC ADVANCED PO), Take by mouth daily., Disp: , Rfl:  .  spironolactone (ALDACTONE) 25 MG tablet, Take 1 tablet (25 mg total) by mouth daily., Disp: 30 tablet, Rfl: 0 .  triamcinolone (KENALOG) 0.025 % cream, Apply 1 application topically 2 (two) times daily., Disp: , Rfl:   Past Medical History: Past Medical History:  Diagnosis Date  . CHF (congestive heart failure) ([facility])   . Coronary artery disease   . Hypertension     Tobacco Use: Social History   Tobacco Use  Smoking Status Never Smoker  Smokeless Tobacco Never Used    Labs: Recent Review Scientist, physiological    Labs for ITP Cardiac and Pulmonary Rehab Latest Ref Rng & Units 12/22/2020   Cholestrol 0 - 200 mg/dL 191   LDLCALC 0 - 99 mg/dL 92   HDL >40 mg/dL 70   Trlycerides <150 mg/dL 144   Hemoglobin A1c 4.8 - 5.6 % 5.7(H)       Exercise Target Goals: Exercise Program Goal: Individual exercise prescription set using results from initial 6 min walk test and THRR while considering  patient's activity barriers and safety.   Exercise Prescription Goal: Initial exercise prescription  builds to 30-45 minutes a day of aerobic activity, 2-3 days per week.  Home exercise guidelines will be given to patient during program as part of exercise prescription that the participant will acknowledge.   Education: Aerobic Exercise: - Group verbal and visual presentation on the components of exercise prescription. Introduces F.I.T.T principle from ACSM for exercise prescriptions.  Reviews F.I.T.T. principles of aerobic exercise including progression. Written material given at graduation. Flowsheet Row Cardiac Rehab from 02/05/2021 in [facility] Cardiac and Pulmonary Rehab  Date 02/05/21  Educator jh  Instruction Review Code 1- Verbalizes Understanding       Education: Resistance Exercise: - Group verbal and visual presentation on the components of exercise prescription. Introduces F.I.T.T principle from ACSM for exercise prescriptions  Reviews F.I.T.T. principles of resistance exercise including progression. Written material given at graduation.    Education: Exercise & Equipment Safety: - Individual verbal instruction and demonstration of equipment use and safety with use of the equipment. Flowsheet Row Cardiac Rehab from 02/05/2021 in [facility] Cardiac and Pulmonary Rehab  Education need identified 01/22/21  Date 01/22/21  Educator [place]  Instruction Review Code 1- Verbalizes Understanding      Education: Exercise Physiology & General Exercise Guidelines: - Group verbal and written instruction with models to review the exercise physiology of the cardiovascular system and associated critical values. Provides general exercise guidelines with specific guidelines to those with heart or lung disease.    Education: Flexibility, Balance, Mind/Body Relaxation: - Group verbal and visual presentation with interactive activity on the components of exercise prescription. Introduces F.I.T.T principle from ACSM for exercise prescriptions. Reviews F.I.T.T. principles of flexibility and balance exercise training including progression. Also discusses the mind body connection.  Reviews various relaxation techniques to help reduce and manage stress (i.e. Deep breathing, progressive muscle relaxation, and visualization). Balance handout provided to take home. Written material given at graduation.   Activity Barriers & Risk Stratification:  Activity Barriers & Cardiac Risk Stratification - 01/22/21 1305      Activity Barriers & Cardiac Risk Stratification   Activity Barriers Back Problems;Balance Concerns;Other (comment)    Comments Back problems related to hx of colon CA (missing part of colon); has completed PT for balance issues- dizzy at times due to  medications    Cardiac Risk Stratification High           6 Minute Walk:  6 Minute Walk    Row Name 01/22/21 1257         6 Minute Walk   Phase Initial     Distance 915 feet     Walk Time 5 minutes  break 2:49-3:45     # of Rest Breaks 1     MPH 2.07     METS 1.7     RPE 11     Perceived Dyspnea  1     VO2 Peak 5.95     Symptoms Yes (comment)     Comments Legs fatigued     Resting HR 70 bpm     Resting BP 100/64     Resting Oxygen Saturation  96 %     Exercise Oxygen Saturation  during 6 min walk 99 %     Max Ex. HR 87 bpm     Max Ex. BP 116/64     2 Minute Post BP 98/62            Oxygen Initial Assessment:   Oxygen Re-Evaluation:   Oxygen Discharge (Final Oxygen Re-Evaluation):   Initial Exercise Prescription:  Initial Exercise Prescription - 01/22/21 1200      Date of Initial Exercise RX and Referring Provider   Date 01/22/21    Referring Provider Lujean Amel MD      Recumbant Bike   Level 1    RPM 60    Watts 10    Minutes 15    METs 1.7      NuStep   Level 1    SPM 80    Minutes 15    METs 1.7      T5 Nustep   Level 1    SPM 80    Minutes 15    METs 1.7      Prescription Details   Frequency (times per week) 3    Duration Progress to 30 minutes of continuous aerobic without signs/symptoms of physical distress      Intensity   THRR 40-80% of Max Heartrate 100-130    Ratings of Perceived Exertion 11-13    Perceived Dyspnea 0-4      Progression   Progression Continue to progress workloads to maintain intensity without signs/symptoms of physical distress.      Resistance Training   Training Prescription Yes    Weight 3 lb    Reps 10-15           Perform Capillary Blood Glucose checks as needed.  Exercise Prescription Changes:  Exercise Prescription Changes    Row Name 01/22/21 1300 01/27/21 0900 02/11/21 1200         Response to Exercise   Blood Pressure (Admit) 100/64 108/64 102/62     Blood Pressure  (Exercise) 116/64 124/62 126/80     Blood Pressure (Exit) 98/62 118/66 98/58     Heart Rate (Admit) 70 bpm 68 bpm 70 bpm     Heart Rate (Exercise) 87 bpm 79 bpm 86 bpm     Heart Rate (Exit) 67 bpm 66 bpm 76 bpm     Oxygen Saturation (Admit) 96 % -- --     Oxygen Saturation (Exercise) 99 % -- --     Oxygen Saturation (Exit) 98 % -- --     Rating of Perceived Exertion (Exercise) 11 13 12      Perceived Dyspnea (Exercise) 1 -- --     Symptoms legs fatigued fatigue none     Comments walk test results first full day of exericse --     Duration -- Progress to 30 minutes of  aerobic without signs/symptoms of physical distress Progress to 30 minutes of  aerobic without signs/symptoms of physical distress     Intensity -- THRR unchanged THRR unchanged           Progression   Progression -- Continue to progress workloads to maintain intensity without signs/symptoms of physical distress. Continue to progress workloads to maintain intensity without signs/symptoms of physical distress.     Average METs -- 1.7 2.6           Resistance Training   Training Prescription Yes Yes Yes     Weight 3 lb 3 lb 3 lb     Reps 10-15 10-15 10-15           Interval Training   Interval Training -- No No           Recumbant Bike   Level 1 -- 2     RPM 60 -- 60     Minutes 15 -- 15     METs 1.7 -- 2.7  NuStep   Level 1 -- 3     SPM 80 -- 80     Minutes 15 -- 15     METs 1.7 -- 2.5           T5 Nustep   Level 1 1 --     SPM 80 -- --     Minutes 15 30 --     METs 1.7 1.8 --            Exercise Comments:   Exercise Goals and Review:  Exercise Goals    Row Name 01/22/21 1305             Exercise Goals   Increase Physical Activity Yes       Intervention Provide advice, education, support and counseling about physical activity/exercise needs.;Develop an individualized exercise prescription for aerobic and resistive training based on initial evaluation findings, risk  stratification, comorbidities and participant's personal goals.       Expected Outcomes Short Term: Attend rehab on a regular basis to increase amount of physical activity.;Long Term: Add in home exercise to make exercise part of routine and to increase amount of physical activity.;Long Term: Exercising regularly at least 3-5 days a week.       Increase Strength and Stamina Yes       Intervention Provide advice, education, support and counseling about physical activity/exercise needs.;Develop an individualized exercise prescription for aerobic and resistive training based on initial evaluation findings, risk stratification, comorbidities and participant's personal goals.       Expected Outcomes Short Term: Increase workloads from initial exercise prescription for resistance, speed, and METs.;Short Term: Perform resistance training exercises routinely during rehab and add in resistance training at home;Long Term: Improve cardiorespiratory fitness, muscular endurance and strength as measured by increased METs and functional capacity (6MWT)       Able to understand and use rate of perceived exertion (RPE) scale Yes       Intervention Provide education and explanation on how to use RPE scale       Expected Outcomes Short Term: Able to use RPE daily in rehab to express subjective intensity level;Long Term:  Able to use RPE to guide intensity level when exercising independently       Able to understand and use Dyspnea scale Yes       Intervention Provide education and explanation on how to use Dyspnea scale       Expected Outcomes Short Term: Able to use Dyspnea scale daily in rehab to express subjective sense of shortness of breath during exertion;Long Term: Able to use Dyspnea scale to guide intensity level when exercising independently       Knowledge and understanding of Target Heart Rate Range (THRR) Yes       Intervention Provide education and explanation of THRR including how the numbers were predicted  and where they are located for reference       Expected Outcomes Short Term: Able to state/look up THRR;Short Term: Able to use daily as guideline for intensity in rehab;Long Term: Able to use THRR to govern intensity when exercising independently       Able to check pulse independently Yes       Intervention Provide education and demonstration on how to check pulse in carotid and radial arteries.;Review the importance of being able to check your own pulse for safety during independent exercise       Expected Outcomes Short Term: Able to explain why pulse checking is important  during independent exercise;Long Term: Able to check pulse independently and accurately       Understanding of Exercise Prescription Yes       Intervention Provide education, explanation, and written materials on patient's individual exercise prescription       Expected Outcomes Short Term: Able to explain program exercise prescription;Long Term: Able to explain home exercise prescription to exercise independently              Exercise Goals Re-Evaluation :  Exercise Goals Re-Evaluation    Row Name 01/23/21 1355 02/11/21 1245           Exercise Goal Re-Evaluation   Exercise Goals Review Increase Physical Activity;Able to understand and use rate of perceived exertion (RPE) scale;Knowledge and understanding of Target Heart Rate Range (THRR);Understanding of Exercise Prescription;Increase Strength and Stamina;Able to check pulse independently Increase Physical Activity;Increase Strength and Stamina      Comments Reviewed RPE and dyspnea scales, THR and program prescription with pt today.  Pt voiced understanding and was given a copy of goals to take home. Heide is progressing well with exercise.  She has increase to level 2 on RB and 3 on T4.      Expected Outcomes Short: Use RPE daily to regulate intensity. Long: Follow program prescription in THR. Short:  attend consistently Long: improve overall stamina              Discharge Exercise Prescription (Final Exercise Prescription Changes):  Exercise Prescription Changes - 02/11/21 1200      Response to Exercise   Blood Pressure (Admit) 102/62    Blood Pressure (Exercise) 126/80    Blood Pressure (Exit) 98/58    Heart Rate (Admit) 70 bpm    Heart Rate (Exercise) 86 bpm    Heart Rate (Exit) 76 bpm    Rating of Perceived Exertion (Exercise) 12    Symptoms none    Duration Progress to 30 minutes of  aerobic without signs/symptoms of physical distress    Intensity THRR unchanged      Progression   Progression Continue to progress workloads to maintain intensity without signs/symptoms of physical distress.    Average METs 2.6      Resistance Training   Training Prescription Yes    Weight 3 lb    Reps 10-15      Interval Training   Interval Training No      Recumbant Bike   Level 2    RPM 60    Minutes 15    METs 2.7      NuStep   Level 3    SPM 80    Minutes 15    METs 2.5           Nutrition:  Target Goals: Understanding of nutrition guidelines, daily intake of sodium 1500mg , cholesterol 200mg , calories 30% from fat and 7% or less from saturated fats, daily to have 5 or more servings of fruits and vegetables.  Education: All About Nutrition: -Group instruction provided by verbal, written material, interactive activities, discussions, models, and posters to present general guidelines for heart healthy nutrition including fat, fiber, MyPlate, the role of sodium in heart healthy nutrition, utilization of the nutrition label, and utilization of this knowledge for meal planning. Follow up email sent as well. Written material given at graduation.   Biometrics:  Pre Biometrics - 01/22/21 1307      Pre Biometrics   Height 5' 4.5" (1.638 m)    Weight 160 lb 11.2 oz (72.9 kg)  BMI (Calculated) 27.17    Single Leg Stand 9.3 seconds            Nutrition Therapy Plan and Nutrition Goals:   Nutrition  Assessments:  MEDIFICTS Score Key:  ?70 Need to make dietary changes   40-70 Heart Healthy Diet  ? 40 Therapeutic Level Cholesterol Diet  Flowsheet Row Cardiac Rehab from 01/22/2021 in [facility] Cardiac and Pulmonary Rehab  Picture Your Plate Total Score on Admission 72     Picture Your Plate Scores:  <76 Unhealthy dietary pattern with much room for improvement.  41-50 Dietary pattern unlikely to meet recommendations for good health and room for improvement.  51-60 More healthful dietary pattern, with some room for improvement.   >60 Healthy dietary pattern, although there may be some specific behaviors that could be improved.    Nutrition Goals Re-Evaluation:   Nutrition Goals Discharge (Final Nutrition Goals Re-Evaluation):   Psychosocial: Target Goals: Acknowledge presence or absence of significant depression and/or stress, maximize coping skills, provide positive support system. Participant is able to verbalize types and ability to use techniques and skills needed for reducing stress and depression.   Education: Stress, Anxiety, and Depression - Group verbal and visual presentation to define topics covered.  Reviews how body is impacted by stress, anxiety, and depression.  Also discusses healthy ways to reduce stress and to treat/manage anxiety and depression.  Written material given at graduation.   Education: Sleep Hygiene -Provides group verbal and written instruction about how sleep can affect your health.  Define sleep hygiene, discuss sleep cycles and impact of sleep habits. Review good sleep hygiene tips.    Initial Review & Psychosocial Screening:  Initial Psych Review & Screening - 01/15/21 1043      Initial Review   Current issues with Current Sleep Concerns;Current Stress Concerns    Source of Stress Concerns Chronic Illness      Family Dynamics   Good Support System? Yes   husband, 3 children live close     Barriers   Psychosocial barriers to participate  in program There are no identifiable barriers or psychosocial needs.      Screening Interventions   Interventions Encouraged to exercise;Provide feedback about the scores to participant;To provide support and resources with identified psychosocial needs    Expected Outcomes Short Term goal: Utilizing psychosocial counselor, staff and physician to assist with identification of specific Stressors or current issues interfering with healing process. Setting desired goal for each stressor or current issue identified.;Long Term Goal: Stressors or current issues are controlled or eliminated.;Short Term goal: Identification and review with participant of any Quality of Life or Depression concerns found by scoring the questionnaire.;Long Term goal: The participant improves quality of Life and PHQ9 Scores as seen by post scores and/or verbalization of changes           Quality of Life Scores:   Quality of Life - 01/22/21 1317      Quality of Life   Select Quality of Life      Quality of Life Scores   Health/Function Pre 19.8 %    Socioeconomic Pre 24.38 %    Psych/Spiritual Pre 26.57 %    Family Pre 30 %    GLOBAL Pre 23.66 %          Scores of 19 and below usually indicate a poorer quality of life in these areas.  A difference of  2-3 points is a clinically meaningful difference.  A difference of 2-3 points in  the total score of the Quality of Life Index has been associated with significant improvement in overall quality of life, self-image, physical symptoms, and general health in studies assessing change in quality of life.  PHQ-9: Recent Review Flowsheet Data    Depression screen [facility] 2/9 01/22/2021   Decreased Interest 0   Down, Depressed, Hopeless 0   PHQ - 2 Score 0   Altered sleeping 1   Tired, decreased energy 1   Change in appetite 0   Feeling bad or failure about yourself  0   Trouble concentrating 0   Moving slowly or fidgety/restless 0   Suicidal thoughts 0   PHQ-9 Score 2    Difficult doing work/chores Not difficult at all     Interpretation of Total Score  Total Score Depression Severity:  1-4 = Minimal depression, 5-9 = Mild depression, 10-14 = Moderate depression, 15-19 = Moderately severe depression, 20-27 = Severe depression   Psychosocial Evaluation and Intervention:  Psychosocial Evaluation - 01/15/21 1100      Psychosocial Evaluation & Interventions   Comments Mrs. Lafon reports doing well after her STEMI and Covid. She feels like she is over Covid and didn't even know she had it at the time. She still has some minor chest discomfort but nothing her MD is worried about. She is on Wellbutrin and reports it is still working. She states her husband and 3 local children are very supportive, one of her daughters comes over and cooks low sodium meals as well. Her sleep pattern hasnt changed after her MI and is still requiring some medication to help her stay asleep. Her balance is still an issue (she's completed PT in the past) and hopes to work on it while she is here. She does have some back pain related to history of colon cancer and bowel surgery. She is motivated to start cardiac rehab to help with strength and stamina    Expected Outcomes Short: attend cardiac rehab for education and exercise. Long: develop and maintain positive self care habits.    Continue Psychosocial Services  Follow up required by staff           Psychosocial Re-Evaluation:   Psychosocial Discharge (Final Psychosocial Re-Evaluation):   Vocational Rehabilitation: Provide vocational rehab assistance to qualifying candidates.   Vocational Rehab Evaluation & Intervention:  Vocational Rehab - 01/15/21 1043      Initial Vocational Rehab Evaluation & Intervention   Assessment shows need for Vocational Rehabilitation No           Education: Education Goals: Education classes will be provided on a variety of topics geared toward better understanding of heart health and risk  factor modification. Participant will state understanding/return demonstration of topics presented as noted by education test scores.  Learning Barriers/Preferences:  Learning Barriers/Preferences - 01/15/21 1043      Learning Barriers/Preferences   Learning Barriers None    Learning Preferences None           General Cardiac Education Topics:  AED/CPR: - Group verbal and written instruction with the use of models to demonstrate the basic use of the AED with the basic ABC's of resuscitation.   Anatomy and Cardiac Procedures: - Group verbal and visual presentation and models provide information about basic cardiac anatomy and function. Reviews the testing methods done to diagnose heart disease and the outcomes of the test results. Describes the treatment choices: Medical Management, Angioplasty, or Coronary Bypass Surgery for treating various heart conditions including Myocardial Infarction, Angina, Valve  Disease, and Cardiac Arrhythmias.  Written material given at graduation.   Medication Safety: - Group verbal and visual instruction to review commonly prescribed medications for heart and lung disease. Reviews the medication, class of the drug, and side effects. Includes the steps to properly store meds and maintain the prescription regimen.  Written material given at graduation.   Intimacy: - Group verbal instruction through game format to discuss how heart and lung disease can affect sexual intimacy. Written material given at graduation.. Flowsheet Row Cardiac Rehab from 02/05/2021 in [facility] Cardiac and Pulmonary Rehab  Date 02/05/21  Educator jh  Instruction Review Code 1- Verbalizes Understanding      Know Your Numbers and Heart Failure: - Group verbal and visual instruction to discuss disease risk factors for cardiac and pulmonary disease and treatment options.  Reviews associated critical values for Overweight/Obesity, Hypertension, Cholesterol, and Diabetes.  Discusses basics  of heart failure: signs/symptoms and treatments.  Introduces Heart Failure Zone chart for action plan for heart failure.  Written material given at graduation.   Infection Prevention: - Provides verbal and written material to individual with discussion of infection control including proper hand washing and proper equipment cleaning during exercise session. Flowsheet Row Cardiac Rehab from 02/05/2021 in [facility] Cardiac and Pulmonary Rehab  Education need identified 01/22/21  Date 01/22/21  Educator [place]  Instruction Review Code 1- Verbalizes Understanding      Falls Prevention: - Provides verbal and written material to individual with discussion of falls prevention and safety. Flowsheet Row Cardiac Rehab from 02/05/2021 in [facility] Cardiac and Pulmonary Rehab  Education need identified 01/22/21  Date 01/22/21  Educator [place]  Instruction Review Code 1- Verbalizes Understanding      Other: -Provides group and verbal instruction on various topics (see comments)   Knowledge Questionnaire Score:  Knowledge Questionnaire Score - 01/22/21 1317      Knowledge Questionnaire Score   Pre Score 21/26: Angina, A&P, exercise, nutrition           Core Components/Risk Factors/Patient Goals at Admission:  Personal Goals and Risk Factors at Admission - 01/22/21 1308      Core Components/Risk Factors/Patient Goals on Admission    Weight Management Yes;Weight Loss    Intervention Weight Management: Develop a combined nutrition and exercise program designed to reach desired caloric intake, while maintaining appropriate intake of nutrient and fiber, sodium and fats, and appropriate energy expenditure required for the weight goal.;Weight Management: Provide education and appropriate resources to help participant work on and attain dietary goals.;Weight Management/Obesity: Establish reasonable short term and long term weight goals.    Admit Weight 160 lb (72.6 kg)    Goal Weight: Short Term 157 lb (71.2 kg)     Goal Weight: Long Term 153 lb (69.4 kg)    Expected Outcomes Short Term: Continue to assess and modify interventions until short term weight is achieved;Long Term: Adherence to nutrition and physical activity/exercise program aimed toward attainment of established weight goal;Weight Loss: Understanding of general recommendations for a balanced deficit meal plan, which promotes 1-2 lb weight loss per week and includes a negative energy balance of [phone] kcal/d;Understanding recommendations for meals to include 15-35% energy as protein, 25-35% energy from fat, 35-60% energy from carbohydrates, less than 200mg  of dietary cholesterol, 20-35 gm of total fiber daily;Understanding of distribution of calorie intake throughout the day with the consumption of 4-5 meals/snacks    Heart Failure Yes    Intervention Provide a combined exercise and nutrition program that is supplemented with  education, support and counseling about heart failure. Directed toward relieving symptoms such as shortness of breath, decreased exercise tolerance, and extremity edema.    Expected Outcomes Improve functional capacity of life;Short term: Attendance in program 2-3 days a week with increased exercise capacity. Reported lower sodium intake. Reported increased fruit and vegetable intake. Reports medication compliance.;Short term: Daily weights obtained and reported for increase. Utilizing diuretic protocols set by physician.;Long term: Adoption of self-care skills and reduction of barriers for early signs and symptoms recognition and intervention leading to self-care maintenance.    Hypertension Yes    Intervention Provide education on lifestyle modifcations including regular physical activity/exercise, weight management, moderate sodium restriction and increased consumption of fresh fruit, vegetables, and low fat dairy, alcohol moderation, and smoking cessation.;Monitor prescription use compliance.    Expected Outcomes Short Term:  Continued assessment and intervention until BP is < 140/107mm HG in hypertensive participants. < 130/38mm HG in hypertensive participants with diabetes, heart failure or chronic kidney disease.;Long Term: Maintenance of blood pressure at goal levels.    Lipids Yes    Intervention Provide education and support for participant on nutrition & aerobic/resistive exercise along with prescribed medications to achieve LDL 70mg , HDL >40mg .    Expected Outcomes Short Term: Participant states understanding of desired cholesterol values and is compliant with medications prescribed. Participant is following exercise prescription and nutrition guidelines.;Long Term: Cholesterol controlled with medications as prescribed, with individualized exercise RX and with personalized nutrition plan. Value goals: LDL < 70mg , HDL > 40 mg.           Education:Diabetes - Individual verbal and written instruction to review signs/symptoms of diabetes, desired ranges of glucose level fasting, after meals and with exercise. Acknowledge that pre and post exercise glucose checks will be done for 3 sessions at entry of program.   Core Components/Risk Factors/Patient Goals Review:    Core Components/Risk Factors/Patient Goals at Discharge (Final Review):    ITP Comments:  ITP Comments    Row Name 01/15/21 1034 01/22/21 1242 01/23/21 1355 02/12/21 0703     ITP Comments Initial telephone orientation completed. Diagnosis can be found in CHL 1/2. EP orientation scheduled for 2/2 at 11am. Completed 6MWT and gym orientation. Initial ITP created and sent for review to Dr. Emily Filbert, Medical Director. First full day of exercise!  Patient was oriented to gym and equipment including functions, settings, policies, and procedures.  Patient's individual exercise prescription and treatment plan were reviewed.  All starting workloads were established based on the results of the 6 minute walk test done at initial orientation visit.  The plan  for exercise progression was also introduced and progression will be customized based on patient's performance and goals. 30 Day review completed. Medical Director ITP review done, changes made as directed, and signed approval by Medical Director.           Comments:

## 2021-02-17 ENCOUNTER — Other Ambulatory Visit: Payer: Self-pay

## 2021-02-17 DIAGNOSIS — I213 ST elevation (STEMI) myocardial infarction of unspecified site: Secondary | ICD-10-CM | POA: Diagnosis not present

## 2021-02-17 DIAGNOSIS — Z955 Presence of coronary angioplasty implant and graft: Secondary | ICD-10-CM

## 2021-02-17 NOTE — Progress Notes (Signed)
Daily Session Note  Patient Details  Name: Karen Madden MRN: [id number] Date of Birth: 02/15/1946 Referring Provider:   Flowsheet Row Cardiac Rehab from 01/22/2021 in [facility] Cardiac and Pulmonary Rehab  Referring Provider Lujean Amel MD      Encounter Date: 02/17/2021  Check In:  Session Check In - 02/17/21 1405      Check-In   Supervising physician immediately available to respond to emergencies See telemetry face sheet for immediately available ER MD    Location [facility]-Cardiac & Pulmonary Rehab    Staff Present Birdie Sons, MPA, Mauricia Area, BS, ACSM CEP, Exercise Physiologist;Kara Eliezer Bottom, MS Exercise Physiologist    Virtual Visit No    Medication changes reported     No    Fall or balance concerns reported    No    Warm-up and Cool-down Performed on first and last piece of equipment    Resistance Training Performed Yes    VAD Patient? No    PAD/SET Patient? No      Pain Assessment   Currently in Pain? No/denies              Social History   Tobacco Use  Smoking Status Never Smoker  Smokeless Tobacco Never Used    Goals Met:  Independence with exercise equipment Exercise tolerated well No report of cardiac concerns or symptoms Strength training completed today  Goals Unmet:  Not Applicable  Comments: Pt able to follow exercise prescription today without complaint.  Will continue to monitor for progression.    Dr. Emily Filbert is Medical Director for [place] and [facility].

## 2021-02-19 ENCOUNTER — Other Ambulatory Visit: Payer: Self-pay

## 2021-02-19 ENCOUNTER — Encounter: Payer: Medicare Other | Attending: Internal Medicine

## 2021-02-19 DIAGNOSIS — Z955 Presence of coronary angioplasty implant and graft: Secondary | ICD-10-CM

## 2021-02-19 DIAGNOSIS — I213 ST elevation (STEMI) myocardial infarction of unspecified site: Secondary | ICD-10-CM | POA: Diagnosis not present

## 2021-02-19 NOTE — Progress Notes (Signed)
Daily Session Note  Patient Details  Name: Karen Madden MRN: [id number] Date of Birth: 04-14-1946 Referring Provider:   Flowsheet Row Cardiac Rehab from 01/22/2021 in [facility] Cardiac and Pulmonary Rehab  Referring Provider Lujean Amel MD      Encounter Date: 02/19/2021  Check In:  Session Check In - 02/19/21 1427      Check-In   Supervising physician immediately available to respond to emergencies See telemetry face sheet for immediately available ER MD    Location [facility]-Cardiac & Pulmonary Rehab    Staff Present Birdie Sons, MPA, RN;Joseph Lou Min[place], [place] Exercise Physiologist    Virtual Visit No    Medication changes reported     No    Fall or balance concerns reported    No    Warm-up and Cool-down Performed on first and last piece of equipment    Resistance Training Performed Yes    VAD Patient? No    PAD/SET Patient? No      Pain Assessment   Currently in Pain? No/denies              Social History   Tobacco Use  Smoking Status Never Smoker  Smokeless Tobacco Never Used    Goals Met:  Independence with exercise equipment Exercise tolerated well No report of cardiac concerns or symptoms Strength training completed today  Goals Unmet:  Not Applicable  Comments: Pt able to follow exercise prescription today without complaint.  Will continue to monitor for progression.    Dr. Emily Filbert is Medical Director for [place] and [facility].

## 2021-02-20 ENCOUNTER — Other Ambulatory Visit: Payer: Self-pay

## 2021-02-20 ENCOUNTER — Encounter: Payer: Medicare Other | Admitting: *Deleted

## 2021-02-20 DIAGNOSIS — Z955 Presence of coronary angioplasty implant and graft: Secondary | ICD-10-CM

## 2021-02-20 DIAGNOSIS — I213 ST elevation (STEMI) myocardial infarction of unspecified site: Secondary | ICD-10-CM | POA: Diagnosis not present

## 2021-02-20 NOTE — Progress Notes (Signed)
Daily Session Note  Patient Details  Name: GAYATHRI FUTRELL MRN: [id number] Date of Birth: [birth year] Referring Provider:   Flowsheet Row Cardiac Rehab from 01/22/2021 in [facility] Cardiac and Pulmonary Rehab  Referring Provider Lujean Amel MD      Encounter Date: 02/20/2021  Check In:  Session Check In - 02/20/21 1359      Check-In   Supervising physician immediately available to respond to emergencies See telemetry face sheet for immediately available ER MD    Location [facility]-Cardiac & Pulmonary Rehab    Staff Present Renita Papa, RN BSN;Joseph [address] [place], [place], [place], CCRP, CCET    Virtual Visit No    Medication changes reported     No    Fall or balance concerns reported    No    Warm-up and Cool-down Performed on first and last piece of equipment    Resistance Training Performed Yes    VAD Patient? No    PAD/SET Patient? No      Pain Assessment   Currently in Pain? No/denies              Social History   Tobacco Use  Smoking Status Never Smoker  Smokeless Tobacco Never Used    Goals Met:  Independence with exercise equipment Exercise tolerated well No report of cardiac concerns or symptoms Strength training completed today  Goals Unmet:  Not Applicable  Comments: Pt able to follow exercise prescription today without complaint.  Will continue to monitor for progression.    Dr. Emily Filbert is Medical Director for [place] and [facility].

## 2021-02-24 ENCOUNTER — Other Ambulatory Visit: Payer: Self-pay

## 2021-02-24 DIAGNOSIS — I213 ST elevation (STEMI) myocardial infarction of unspecified site: Secondary | ICD-10-CM | POA: Diagnosis not present

## 2021-02-24 DIAGNOSIS — Z955 Presence of coronary angioplasty implant and graft: Secondary | ICD-10-CM

## 2021-02-24 NOTE — Progress Notes (Signed)
Daily Session Note  Patient Details  Name: Karen Madden MRN: [id number] Date of Birth: 06/14/1946 Referring Provider:   Flowsheet Row Cardiac Rehab from 01/22/2021 in [facility] Cardiac and Pulmonary Rehab  Referring Provider Lujean Amel MD      Encounter Date: 02/24/2021  Check In:  Session Check In - 02/24/21 1410      Check-In   Supervising physician immediately available to respond to emergencies See telemetry face sheet for immediately available ER MD    Location [facility]-Cardiac & Pulmonary Rehab    Staff Present Birdie Sons, MPA, Mauricia Area, BS, ACSM CEP, Exercise Physiologist;Kara Eliezer Bottom, MS Exercise Physiologist    Virtual Visit No    Medication changes reported     No    Fall or balance concerns reported    No    Warm-up and Cool-down Performed on first and last piece of equipment    Resistance Training Performed Yes    VAD Patient? No    PAD/SET Patient? No      Pain Assessment   Currently in Pain? No/denies              Social History   Tobacco Use  Smoking Status Never Smoker  Smokeless Tobacco Never Used    Goals Met:  Independence with exercise equipment Exercise tolerated well No report of cardiac concerns or symptoms Strength training completed today  Goals Unmet:  Not Applicable  Comments: Pt able to follow exercise prescription today without complaint.  Will continue to monitor for progression.    Dr. Emily Filbert is Medical Director for [place] and [facility].

## 2021-02-26 ENCOUNTER — Other Ambulatory Visit: Payer: Self-pay

## 2021-02-26 DIAGNOSIS — I213 ST elevation (STEMI) myocardial infarction of unspecified site: Secondary | ICD-10-CM

## 2021-02-26 DIAGNOSIS — Z955 Presence of coronary angioplasty implant and graft: Secondary | ICD-10-CM

## 2021-02-26 NOTE — Progress Notes (Signed)
Daily Session Note  Patient Details  Name: Karen Madden MRN: [id number] Date of Birth: 07/30/1946 Referring Provider:   Flowsheet Row Cardiac Rehab from 01/22/2021 in [facility] Cardiac and Pulmonary Rehab  Referring Provider Lujean Amel MD      Encounter Date: 02/26/2021  Check In:  Session Check In - 02/26/21 1412      Check-In   Supervising physician immediately available to respond to emergencies See telemetry face sheet for immediately available ER MD    Location [facility]-Cardiac & Pulmonary Rehab    Staff Present Birdie Sons, MPA, RN;Amanda Oletta Darter, BA, ACSM CEP, Exercise Physiologist;Kara Eliezer Bottom, MS Exercise Physiologist    Virtual Visit No    Medication changes reported     Yes    Comments taking 30m losartan instead of 1038mdaily    Fall or balance concerns reported    No    Warm-up and Cool-down Performed on first and last piece of equipment    Resistance Training Performed Yes    VAD Patient? No    PAD/SET Patient? No      Pain Assessment   Currently in Pain? No/denies              Social History   Tobacco Use  Smoking Status Never Smoker  Smokeless Tobacco Never Used    Goals Met:  Independence with exercise equipment Exercise tolerated well No report of cardiac concerns or symptoms Strength training completed today  Goals Unmet:  Not Applicable  Comments: Pt able to follow exercise prescription today without complaint.  Will continue to monitor for progression.    Dr. MaEmily Filberts Medical Director for [facility].

## 2021-02-27 ENCOUNTER — Encounter: Payer: Medicare Other | Admitting: *Deleted

## 2021-02-27 ENCOUNTER — Other Ambulatory Visit: Payer: Self-pay

## 2021-02-27 DIAGNOSIS — I213 ST elevation (STEMI) myocardial infarction of unspecified site: Secondary | ICD-10-CM

## 2021-02-27 DIAGNOSIS — Z955 Presence of coronary angioplasty implant and graft: Secondary | ICD-10-CM

## 2021-02-27 NOTE — Progress Notes (Signed)
Daily Session Note  Patient Details  Name: Karen Madden MRN: [id number] Date of Birth: March 20, 1946 Referring Provider:   Flowsheet Row Cardiac Rehab from 01/22/2021 in [facility] Cardiac and Pulmonary Rehab  Referring Provider Lujean Amel MD      Encounter Date: 02/27/2021  Check In:  Session Check In - 02/27/21 1358      Check-In   Supervising physician immediately available to respond to emergencies See telemetry face sheet for immediately available ER MD    Location [facility]-Cardiac & Pulmonary Rehab    Staff Present Renita Papa, RN BSN;Joseph [address] [place], [place], RCEP, CCRP, CCET    Virtual Visit No    Medication changes reported     No    Fall or balance concerns reported    No    Warm-up and Cool-down Performed on first and last piece of equipment    Resistance Training Performed Yes    VAD Patient? No    PAD/SET Patient? No      Pain Assessment   Currently in Pain? No/denies              Social History   Tobacco Use  Smoking Status Never Smoker  Smokeless Tobacco Never Used    Goals Met:  Independence with exercise equipment Exercise tolerated well No report of cardiac concerns or symptoms Strength training completed today  Goals Unmet:  Not Applicable  Comments: Pt able to follow exercise prescription today without complaint.  Will continue to monitor for progression.    Dr. Emily Filbert is Medical Director for [place] and [facility].

## 2021-03-03 ENCOUNTER — Other Ambulatory Visit: Payer: Self-pay

## 2021-03-03 DIAGNOSIS — Z955 Presence of coronary angioplasty implant and graft: Secondary | ICD-10-CM

## 2021-03-03 DIAGNOSIS — I213 ST elevation (STEMI) myocardial infarction of unspecified site: Secondary | ICD-10-CM | POA: Diagnosis not present

## 2021-03-03 NOTE — Progress Notes (Signed)
Daily Session Note  Patient Details  Name: Karen Madden MRN: [id number] Date of Birth: [birth year] Referring Provider:   Flowsheet Row Cardiac Rehab from 01/22/2021 in [facility] Cardiac and Pulmonary Rehab  Referring Provider Lujean Amel MD      Encounter Date: 03/03/2021  Check In:  Session Check In - 03/03/21 1410      Check-In   Supervising physician immediately available to respond to emergencies See telemetry face sheet for immediately available ER MD    Location [facility]-Cardiac & Pulmonary Rehab    Staff Present Birdie Sons, MPA, RN;Susanne Bice, RN, BSN, [place]aveda Norman, BS, ACSM CEP, Exercise Physiologist;Kara Eliezer Bottom, MS Exercise Physiologist    Virtual Visit No    Medication changes reported     No    Fall or balance concerns reported    No    Warm-up and Cool-down Performed on first and last piece of equipment    Resistance Training Performed Yes    VAD Patient? No    PAD/SET Patient? No      Pain Assessment   Currently in Pain? No/denies              Social History   Tobacco Use  Smoking Status Never Smoker  Smokeless Tobacco Never Used    Goals Met:  Proper associated with RPD/PD & O2 Sat Independence with exercise equipment Exercise tolerated well No report of cardiac concerns or symptoms  Goals Unmet:  Not Applicable  Comments: Pt able to follow exercise prescription today without complaint.  Will continue to monitor for progression.    Dr. Emily Filbert is Medical Director for [place] and [facility].

## 2021-03-05 ENCOUNTER — Other Ambulatory Visit: Payer: Self-pay

## 2021-03-05 DIAGNOSIS — I213 ST elevation (STEMI) myocardial infarction of unspecified site: Secondary | ICD-10-CM

## 2021-03-05 DIAGNOSIS — Z955 Presence of coronary angioplasty implant and graft: Secondary | ICD-10-CM

## 2021-03-05 NOTE — Progress Notes (Signed)
Daily Session Note  Patient Details  Name: ANDA SOBOTTA MRN: [id number] Date of Birth: 1946-01-30 Referring Provider:   Flowsheet Row Cardiac Rehab from 01/22/2021 in [facility] Cardiac and Pulmonary Rehab  Referring Provider Lujean Amel MD      Encounter Date: 03/05/2021  Check In:  Session Check In - 03/05/21 1407      Check-In   Supervising physician immediately available to respond to emergencies See telemetry face sheet for immediately available ER MD    Location [facility]-Cardiac & Pulmonary Rehab    Staff Present Birdie Sons, MPA, RN;Joseph Alcus Dad, RN Margurite Auerbach, MS Exercise Physiologist    Virtual Visit No    Medication changes reported     No    Fall or balance concerns reported    No    Warm-up and Cool-down Performed on first and last piece of equipment    Resistance Training Performed Yes    VAD Patient? No    PAD/SET Patient? No      Pain Assessment   Currently in Pain? No/denies              Social History   Tobacco Use  Smoking Status Never Smoker  Smokeless Tobacco Never Used    Goals Met:  Independence with exercise equipment Exercise tolerated well No report of cardiac concerns or symptoms Strength training completed today  Goals Unmet:  Not Applicable  Comments: Pt able to follow exercise prescription today without complaint.  Will continue to monitor for progression.    Dr. Emily Filbert is Medical Director for [place] and [facility].

## 2021-03-06 ENCOUNTER — Other Ambulatory Visit: Payer: Self-pay

## 2021-03-06 ENCOUNTER — Encounter: Payer: Medicare Other | Admitting: *Deleted

## 2021-03-06 DIAGNOSIS — Z955 Presence of coronary angioplasty implant and graft: Secondary | ICD-10-CM

## 2021-03-06 DIAGNOSIS — I213 ST elevation (STEMI) myocardial infarction of unspecified site: Secondary | ICD-10-CM

## 2021-03-06 NOTE — Progress Notes (Signed)
Daily Session Note  Patient Details  Name: AMBRIEL GORELICK MRN: [id number] Date of Birth: 1946-07-28 Referring Provider:   Flowsheet Row Cardiac Rehab from 01/22/2021 in [facility] Cardiac and Pulmonary Rehab  Referring Provider Lujean Amel MD      Encounter Date: 03/06/2021  Check In:  Session Check In - 03/06/21 1402      Check-In   Supervising physician immediately available to respond to emergencies See telemetry face sheet for immediately available ER MD    Location [facility]-Cardiac & Pulmonary Rehab    Staff Present Renita Papa, RN BSN;Joseph [address] [place], [place], [place], CCRP, CCET    Virtual Visit No    Medication changes reported     No    Fall or balance concerns reported    No    Warm-up and Cool-down Performed on first and last piece of equipment    Resistance Training Performed Yes    VAD Patient? No    PAD/SET Patient? No      Pain Assessment   Currently in Pain? No/denies              Social History   Tobacco Use  Smoking Status Never Smoker  Smokeless Tobacco Never Used    Goals Met:  Independence with exercise equipment Exercise tolerated well No report of cardiac concerns or symptoms Strength training completed today  Goals Unmet:  Not Applicable  Comments: Pt able to follow exercise prescription today without complaint.  Will continue to monitor for progression.    Dr. Emily Filbert is Medical Director for [place] and [facility].

## 2021-03-12 ENCOUNTER — Encounter: Payer: Self-pay | Admitting: *Deleted

## 2021-03-12 ENCOUNTER — Other Ambulatory Visit: Payer: Self-pay

## 2021-03-12 DIAGNOSIS — I213 ST elevation (STEMI) myocardial infarction of unspecified site: Secondary | ICD-10-CM

## 2021-03-12 DIAGNOSIS — Z955 Presence of coronary angioplasty implant and graft: Secondary | ICD-10-CM

## 2021-03-12 NOTE — Progress Notes (Signed)
Cardiac Individual Treatment Plan  Patient Details  Name: Karen Madden MRN: [id number] Date of Birth: [birth year] Referring Provider:   Flowsheet Row Cardiac Rehab from 01/22/2021 in [facility] Cardiac and Pulmonary Rehab  Referring Provider Lujean Amel MD      Initial Encounter Date:  Flowsheet Row Cardiac Rehab from 01/22/2021 in [facility] Cardiac and Pulmonary Rehab  Date 01/22/21      Visit Diagnosis: Status post coronary artery stent placement  ST elevation myocardial infarction (STEMI), unspecified artery ([place])  Patient's Home Medications on Admission:  Current Outpatient Medications:  .  aspirin 81 MG chewable tablet, Chew by mouth daily., Disp: , Rfl:  .  atorvastatin (LIPITOR) 80 MG tablet, Take 1 tablet (80 mg total) by mouth daily., Disp: 30 tablet, Rfl: 1 .  buPROPion (WELLBUTRIN XL) 150 MG 24 hr tablet, Take 150 mg by mouth daily., Disp: , Rfl:  .  buPROPion (ZYBAN) 150 MG 12 hr tablet, Take 150 mg by mouth 2 (two) times daily., Disp: , Rfl:  .  cholecalciferol (VITAMIN D3) 10 MCG (400 UNIT) TABS tablet, Take 1,000 Units by mouth daily., Disp: , Rfl:  .  diphenhydrAMINE (SIMPLY SLEEP) 25 MG tablet, Take 25 mg by mouth at bedtime as needed for sleep., Disp: , Rfl:  .  ezetimibe (ZETIA) 10 MG tablet, Take 10 mg by mouth daily., Disp: , Rfl:  .  famotidine (PEPCID) 10 MG tablet, Take 10 mg by mouth 2 (two) times daily., Disp: , Rfl:  .  furosemide (LASIX) 40 MG tablet, Take 1 tablet (40 mg total) by mouth daily. (Patient taking differently: Take 20 mg by mouth daily.), Disp: 30 tablet, Rfl: 0 .  gabapentin (NEURONTIN) 400 MG capsule, Take 400 mg by mouth 3 (three) times daily. Pt takes 400 mg once daily, Disp: , Rfl:  .  levothyroxine (SYNTHROID) 88 MCG tablet, Take 88 mcg by mouth daily before breakfast., Disp: , Rfl:  .  loperamide (IMODIUM A-D) 2 MG tablet, Take 2 mg by mouth 4 (four) times daily as needed for diarrhea or loose stools., Disp: , Rfl:  .  metoprolol succinate  (TOPROL-XL) 25 MG 24 hr tablet, Take 0.5 tablets (12.5 mg total) by mouth daily. (Patient taking differently: Take 25 mg by mouth daily.), Disp: 15 tablet, Rfl: 0 .  Probiotic Product (PROBIOTIC ADVANCED PO), Take by mouth daily., Disp: , Rfl:  .  spironolactone (ALDACTONE) 25 MG tablet, Take 1 tablet (25 mg total) by mouth daily., Disp: 30 tablet, Rfl: 0 .  triamcinolone (KENALOG) 0.025 % cream, Apply 1 application topically 2 (two) times daily., Disp: , Rfl:   Past Medical History: Past Medical History:  Diagnosis Date  . CHF (congestive heart failure) ([place])   . Coronary artery disease   . Hypertension     Tobacco Use: Social History   Tobacco Use  Smoking Status Never Smoker  Smokeless Tobacco Never Used    Labs: Recent Review Scientist, physiological    Labs for ITP Cardiac and Pulmonary Rehab Latest Ref Rng & Units 12/22/2020   Cholestrol 0 - 200 mg/dL 191   LDLCALC 0 - 99 mg/dL 92   HDL >40 mg/dL 70   Trlycerides <150 mg/dL 144   Hemoglobin A1c 4.8 - 5.6 % 5.7(H)       Exercise Target Goals: Exercise Program Goal: Individual exercise prescription set using results from initial 6 min walk test and THRR while considering  patient's activity barriers and safety.   Exercise Prescription Goal: Initial exercise prescription  builds to 30-45 minutes a day of aerobic activity, 2-3 days per week.  Home exercise guidelines will be given to patient during program as part of exercise prescription that the participant will acknowledge.   Education: Aerobic Exercise: - Group verbal and visual presentation on the components of exercise prescription. Introduces F.I.T.T principle from ACSM for exercise prescriptions.  Reviews F.I.T.T. principles of aerobic exercise including progression. Written material given at graduation. Flowsheet Row Cardiac Rehab from 03/05/2021 in [facility] Cardiac and Pulmonary Rehab  Date 02/05/21  Educator jh  Instruction Review Code 1- Verbalizes Understanding       Education: Resistance Exercise: - Group verbal and visual presentation on the components of exercise prescription. Introduces F.I.T.T principle from ACSM for exercise prescriptions  Reviews F.I.T.T. principles of resistance exercise including progression. Written material given at graduation. Flowsheet Row Cardiac Rehab from 03/05/2021 in [facility] Cardiac and Pulmonary Rehab  Date 02/12/21  Educator AS  Instruction Review Code 1- Verbalizes Understanding       Education: Exercise & Equipment Safety: - Individual verbal instruction and demonstration of equipment use and safety with use of the equipment. Flowsheet Row Cardiac Rehab from 03/05/2021 in [facility] Cardiac and Pulmonary Rehab  Education need identified 01/22/21  Date 01/22/21  Educator [place]  Instruction Review Code 1- Verbalizes Understanding      Education: Exercise Physiology & General Exercise Guidelines: - Group verbal and written instruction with models to review the exercise physiology of the cardiovascular system and associated critical values. Provides general exercise guidelines with specific guidelines to those with heart or lung disease.    Education: Flexibility, Balance, Mind/Body Relaxation: - Group verbal and visual presentation with interactive activity on the components of exercise prescription. Introduces F.I.T.T principle from ACSM for exercise prescriptions. Reviews F.I.T.T. principles of flexibility and balance exercise training including progression. Also discusses the mind body connection.  Reviews various relaxation techniques to help reduce and manage stress (i.e. Deep breathing, progressive muscle relaxation, and visualization). Balance handout provided to take home. Written material given at graduation. Flowsheet Row Cardiac Rehab from 03/05/2021 in [facility] Cardiac and Pulmonary Rehab  Date 02/19/21  Educator AS  Instruction Review Code 1- Verbalizes Understanding      Activity Barriers & Risk  Stratification:  Activity Barriers & Cardiac Risk Stratification - 01/22/21 1305      Activity Barriers & Cardiac Risk Stratification   Activity Barriers Back Problems;Balance Concerns;Other (comment)    Comments Back problems related to hx of colon CA (missing part of colon); has completed PT for balance issues- dizzy at times due to medications    Cardiac Risk Stratification High           6 Minute Walk:  6 Minute Walk    Row Name 01/22/21 1257         6 Minute Walk   Phase Initial     Distance 915 feet     Walk Time 5 minutes  break 2:49-3:45     # of Rest Breaks 1     MPH 2.07     METS 1.7     RPE 11     Perceived Dyspnea  1     VO2 Peak 5.95     Symptoms Yes (comment)     Comments Legs fatigued     Resting HR 70 bpm     Resting BP 100/64     Resting Oxygen Saturation  96 %     Exercise Oxygen Saturation  during 6 min walk 99 %  Max Ex. HR 87 bpm     Max Ex. BP 116/64     2 Minute Post BP 98/62            Oxygen Initial Assessment:   Oxygen Re-Evaluation:   Oxygen Discharge (Final Oxygen Re-Evaluation):   Initial Exercise Prescription:  Initial Exercise Prescription - 01/22/21 1200      Date of Initial Exercise RX and Referring Provider   Date 01/22/21    Referring Provider Lujean Amel MD      Recumbant Bike   Level 1    RPM 60    Watts 10    Minutes 15    METs 1.7      NuStep   Level 1    SPM 80    Minutes 15    METs 1.7      T5 Nustep   Level 1    SPM 80    Minutes 15    METs 1.7      Prescription Details   Frequency (times per week) 3    Duration Progress to 30 minutes of continuous aerobic without signs/symptoms of physical distress      Intensity   THRR 40-80% of Max Heartrate 100-130    Ratings of Perceived Exertion 11-13    Perceived Dyspnea 0-4      Progression   Progression Continue to progress workloads to maintain intensity without signs/symptoms of physical distress.      Resistance Training   Training  Prescription Yes    Weight 3 lb    Reps 10-15           Perform Capillary Blood Glucose checks as needed.  Exercise Prescription Changes:  Exercise Prescription Changes    Row Name 01/22/21 1300 01/27/21 0900 02/11/21 1200 02/12/21 1400 02/26/21 0900     Response to Exercise   Blood Pressure (Admit) 100/64 108/64 102/62 -- 110/60   Blood Pressure (Exercise) 116/64 124/62 126/80 -- 136/60   Blood Pressure (Exit) 98/62 118/66 98/58 -- 92/60   Heart Rate (Admit) 70 bpm 68 bpm 70 bpm -- 87 bpm   Heart Rate (Exercise) 87 bpm 79 bpm 86 bpm -- 96 bpm   Heart Rate (Exit) 67 bpm 66 bpm 76 bpm -- 83 bpm   Oxygen Saturation (Admit) 96 % -- -- -- --   Oxygen Saturation (Exercise) 99 % -- -- -- --   Oxygen Saturation (Exit) 98 % -- -- -- --   Rating of Perceived Exertion (Exercise) 11 13 12  -- 15   Perceived Dyspnea (Exercise) 1 -- -- -- --   Symptoms legs fatigued fatigue none -- none   Comments walk test results first full day of exericse -- -- --   Duration -- Progress to 30 minutes of  aerobic without signs/symptoms of physical distress Progress to 30 minutes of  aerobic without signs/symptoms of physical distress -- Continue with 30 min of aerobic exercise without signs/symptoms of physical distress.   Intensity -- THRR unchanged THRR unchanged -- THRR unchanged     Progression   Progression -- Continue to progress workloads to maintain intensity without signs/symptoms of physical distress. Continue to progress workloads to maintain intensity without signs/symptoms of physical distress. -- Continue to progress workloads to maintain intensity without signs/symptoms of physical distress.   Average METs -- 1.7 2.6 -- 2.47     Resistance Training   Training Prescription Yes Yes Yes -- Yes   Weight 3 lb 3 lb 3 lb -- 3 lb  Reps 10-15 10-15 10-15 -- 10-15     Interval Training   Interval Training -- No No -- No     Treadmill   MPH -- -- -- -- 1.2   Grade -- -- -- -- 0   Minutes -- --  -- -- 15   METs -- -- -- -- 1.9     Recumbant Bike   Level 1 -- 2 -- 2   RPM 60 -- 60 -- --   Watts -- -- -- -- 19   Minutes 15 -- 15 -- 15   METs 1.7 -- 2.7 -- 2.66     NuStep   Level 1 -- 3 -- 3   SPM 80 -- 80 -- --   Minutes 15 -- 15 -- 15   METs 1.7 -- 2.5 -- 2.9     T5 Nustep   Level 1 1 -- -- 3   SPM 80 -- -- -- --   Minutes 15 30 -- -- 15   METs 1.7 1.8 -- -- 2     Home Exercise Plan   Plans to continue exercise at -- -- -- Home (comment)  walk, YouTube videos Home (comment)  walk, YouTube videos   Frequency -- -- -- Add 2 additional days to program exercise sessions. Add 2 additional days to program exercise sessions.   Initial Home Exercises Provided -- -- -- 02/12/21 02/12/21   Row Name 03/11/21 1200             Response to Exercise   Blood Pressure (Admit) 124/66       Blood Pressure (Exercise) 110/62       Blood Pressure (Exit) 112/60       Heart Rate (Admit) 80 bpm       Heart Rate (Exercise) 91 bpm       Heart Rate (Exit) 73 bpm       Rating of Perceived Exertion (Exercise) 13       Symptoms none       Duration Continue with 30 min of aerobic exercise without signs/symptoms of physical distress.       Intensity THRR unchanged               Progression   Progression Continue to progress workloads to maintain intensity without signs/symptoms of physical distress.       Average METs 2.9               Resistance Training   Training Prescription Yes       Weight 3 lb       Reps 10-15               Interval Training   Interval Training No               Recumbant Bike   Level 2       Minutes 15       METs 2.6               NuStep   Level 3       Minutes 15       METs 3.2              Exercise Comments:   Exercise Goals and Review:  Exercise Goals    [place] Name 01/22/21 1305             Exercise Goals   Increase Physical Activity Yes       Intervention Provide advice, education,  support and counseling about physical  activity/exercise needs.;Develop an individualized exercise prescription for aerobic and resistive training based on initial evaluation findings, risk stratification, comorbidities and participant's personal goals.       Expected Outcomes Short Term: Attend rehab on a regular basis to increase amount of physical activity.;Long Term: Add in home exercise to make exercise part of routine and to increase amount of physical activity.;Long Term: Exercising regularly at least 3-5 days a week.       Increase Strength and Stamina Yes       Intervention Provide advice, education, support and counseling about physical activity/exercise needs.;Develop an individualized exercise prescription for aerobic and resistive training based on initial evaluation findings, risk stratification, comorbidities and participant's personal goals.       Expected Outcomes Short Term: Increase workloads from initial exercise prescription for resistance, speed, and METs.;Short Term: Perform resistance training exercises routinely during rehab and add in resistance training at home;Long Term: Improve cardiorespiratory fitness, muscular endurance and strength as measured by increased METs and functional capacity (6MWT)       Able to understand and use rate of perceived exertion (RPE) scale Yes       Intervention Provide education and explanation on how to use RPE scale       Expected Outcomes Short Term: Able to use RPE daily in rehab to express subjective intensity level;Long Term:  Able to use RPE to guide intensity level when exercising independently       Able to understand and use Dyspnea scale Yes       Intervention Provide education and explanation on how to use Dyspnea scale       Expected Outcomes Short Term: Able to use Dyspnea scale daily in rehab to express subjective sense of shortness of breath during exertion;Long Term: Able to use Dyspnea scale to guide intensity level when exercising independently       Knowledge and  understanding of Target Heart Rate Range (THRR) Yes       Intervention Provide education and explanation of THRR including how the numbers were predicted and where they are located for reference       Expected Outcomes Short Term: Able to state/look up THRR;Short Term: Able to use daily as guideline for intensity in rehab;Long Term: Able to use THRR to govern intensity when exercising independently       Able to check pulse independently Yes       Intervention Provide education and demonstration on how to check pulse in carotid and radial arteries.;Review the importance of being able to check your own pulse for safety during independent exercise       Expected Outcomes Short Term: Able to explain why pulse checking is important during independent exercise;Long Term: Able to check pulse independently and accurately       Understanding of Exercise Prescription Yes       Intervention Provide education, explanation, and written materials on patient's individual exercise prescription       Expected Outcomes Short Term: Able to explain program exercise prescription;Long Term: Able to explain home exercise prescription to exercise independently              Exercise Goals Re-Evaluation :  Exercise Goals Re-Evaluation    Row Name 01/23/21 1355 02/11/21 1245 02/12/21 1433 02/20/21 1428 02/26/21 0925     Exercise Goal Re-Evaluation   Exercise Goals Review Increase Physical Activity;Able to understand and use rate of perceived exertion (RPE) scale;Knowledge and understanding of Target Heart  Rate Range (THRR);Understanding of Exercise Prescription;Increase Strength and Stamina;Able to check pulse independently Increase Physical Activity;Increase Strength and Stamina Increase Physical Activity;Increase Strength and Stamina;Able to understand and use rate of perceived exertion (RPE) scale;Able to understand and use Dyspnea scale;Knowledge and understanding of Target Heart Rate Range (THRR);Able to check pulse  independently;Understanding of Exercise Prescription Increase Physical Activity;Increase Strength and Stamina;Understanding of Exercise Prescription Increase Physical Activity;Increase Strength and Stamina;Understanding of Exercise Prescription   Comments Reviewed RPE and dyspnea scales, THR and program prescription with pt today.  Pt voiced understanding and was given a copy of goals to take home. Ellyson is progressing well with exercise.  She has increase to level 2 on RB and 3 on T4. Reviewed home exercise with pt today.  Pt plans to walk and use YouTube videos at home for exercise.  Reviewed THR, pulse, RPE, sign and symptoms, pulse oximetery and when to call 911 or MD.  Also discussed weather considerations and indoor options.  Pt voiced understanding. Shaden is doing well in rehab.  She walked on the treadmill today for 5 min.  She can tell that it is making a difference.  Today, she was able to walk down here faster than normal.  She has not really started to add in her home exercise in yet.  She has been walking around some in the grocery store. Tangela continues to do well in rehab.  She is on level 3 for the NuStep.  We will continue to monitor her progress.   Expected Outcomes Short: Use RPE daily to regulate intensity. Long: Follow program prescription in THR. Short:  attend consistently Long: improve overall stamina Short: Start to add in exercise videos on off days and find 2lb weights Long: Continue to exercise independently Short: Add in more exercise at home Long: Continue to improve stamina. Short: Contnue to move up workloads Long: Continue to improve stamina.   [place] Name 03/11/21 1234             Exercise Goal Re-Evaluation   Exercise Goals Review Increase Physical Activity;Increase Strength and Stamina       Comments Rayley attends consistently.  She doesnt quite reach target HR range of 110-130.  Staff will encourage her to increase levels to reach HR range.       Expected  Outcomes Short: reach THR range Long: improve stamina              Discharge Exercise Prescription (Final Exercise Prescription Changes):  Exercise Prescription Changes - 03/11/21 1200      Response to Exercise   Blood Pressure (Admit) 124/66    Blood Pressure (Exercise) 110/62    Blood Pressure (Exit) 112/60    Heart Rate (Admit) 80 bpm    Heart Rate (Exercise) 91 bpm    Heart Rate (Exit) 73 bpm    Rating of Perceived Exertion (Exercise) 13    Symptoms none    Duration Continue with 30 min of aerobic exercise without signs/symptoms of physical distress.    Intensity THRR unchanged      Progression   Progression Continue to progress workloads to maintain intensity without signs/symptoms of physical distress.    Average METs 2.9      Resistance Training   Training Prescription Yes    Weight 3 lb    Reps 10-15      Interval Training   Interval Training No      Recumbant Bike   Level 2    Minutes 15  METs 2.6      NuStep   Level 3    Minutes 15    METs 3.2           Nutrition:  Target Goals: Understanding of nutrition guidelines, daily intake of sodium 1500mg , cholesterol 200mg , calories 30% from fat and 7% or less from saturated fats, daily to have 5 or more servings of fruits and vegetables.  Education: All About Nutrition: -Group instruction provided by verbal, written material, interactive activities, discussions, models, and posters to present general guidelines for heart healthy nutrition including fat, fiber, MyPlate, the role of sodium in heart healthy nutrition, utilization of the nutrition label, and utilization of this knowledge for meal planning. Follow up email sent as well. Written material given at graduation. Flowsheet Row Cardiac Rehab from 03/05/2021 in [facility] Cardiac and Pulmonary Rehab  Date 02/26/21  Educator [facility]  Instruction Review Code 1- Verbalizes Understanding      Biometrics:  Pre Biometrics - 01/22/21 1307      Pre Biometrics    Height 5' 4.5" (1.638 m)    Weight 160 lb 11.2 oz (72.9 kg)    BMI (Calculated) 27.17    Single Leg Stand 9.3 seconds            Nutrition Therapy Plan and Nutrition Goals:  Nutrition Therapy & Goals - 02/24/21 1504      Nutrition Therapy   Diet Heart healthy, low Na    Drug/Food Interactions Statins/Certain Fruits    Protein (specify units) 60g    Fiber 25 grams    Whole Grain Foods 3 servings    Saturated Fats 12 max. grams    Fruits and Vegetables 8 servings/day    Sodium 1.5 grams      Personal Nutrition Goals   Nutrition Goal ST: calculate sodium using food labels to see what largest source is and if chnages are needed LT: limit sodium to 1500mg  or less per day    Comments B: banana and sometimes piece of toast with butter and peach preserves and low sodium cranberry muffins or great grains with almond milk L: half of sandwich with low salt pimento cheese or PB, cottage cheese and fruit D: chicken or [place], low Na butter on potato and oil and vinegar salad. She does not eat red meat - only chicken, [place], and sometimes fish.  Drinks: water, 1 cup of coffee in am with half and half, ginger tea and honey in evening. She doesn't have a big appetite since heart attack. She has had colon cancer and had a resection of bowel which affects her. She takes amodium to help with that. Sweet potatoes affect her as does tomato sauce. Discussed heart healthy eating.      Intervention Plan   Intervention Prescribe, educate and counsel regarding individualized specific dietary modifications aiming towards targeted core components such as weight, hypertension, lipid management, diabetes, heart failure and other comorbidities.;Nutrition handout(s) given to patient.    Expected Outcomes Short Term Goal: Understand basic principles of dietary content, such as calories, fat, sodium, cholesterol and nutrients.;Short Term Goal: A plan has been developed with personal nutrition goals set during dietitian  appointment.;Long Term Goal: Adherence to prescribed nutrition plan.           Nutrition Assessments:  MEDIFICTS Score Key:  ?70 Need to make dietary changes   40-70 Heart Healthy Diet  ? 40 Therapeutic Level Cholesterol Diet  Flowsheet Row Cardiac Rehab from 01/22/2021 in [facility] Cardiac and Pulmonary Rehab  Picture  Your Plate Total Score on Admission 72     Picture Your Plate Scores:  <40 Unhealthy dietary pattern with much room for improvement.  41-50 Dietary pattern unlikely to meet recommendations for good health and room for improvement.  51-60 More healthful dietary pattern, with some room for improvement.   >60 Healthy dietary pattern, although there may be some specific behaviors that could be improved.    Nutrition Goals Re-Evaluation:  Nutrition Goals Re-Evaluation    [place] Name 02/20/21 1433             Goals   Nutrition Goal Meet with dietician.       Comment Allysson missed her appt earlier this week but got rescheduled for next week after class!       Expected Outcome Meet with dietician.              Nutrition Goals Discharge (Final Nutrition Goals Re-Evaluation):  Nutrition Goals Re-Evaluation - 02/20/21 1433      Goals   Nutrition Goal Meet with dietician.    Comment Decarla missed her appt earlier this week but got rescheduled for next week after class!    Expected Outcome Meet with dietician.           Psychosocial: Target Goals: Acknowledge presence or absence of significant depression and/or stress, maximize coping skills, provide positive support system. Participant is able to verbalize types and ability to use techniques and skills needed for reducing stress and depression.   Education: Stress, Anxiety, and Depression - Group verbal and visual presentation to define topics covered.  Reviews how body is impacted by stress, anxiety, and depression.  Also discusses healthy ways to reduce stress and to treat/manage anxiety and depression.   Written material given at graduation.   Education: Sleep Hygiene -Provides group verbal and written instruction about how sleep can affect your health.  Define sleep hygiene, discuss sleep cycles and impact of sleep habits. Review good sleep hygiene tips.    Initial Review & Psychosocial Screening:  Initial Psych Review & Screening - 01/15/21 1043      Initial Review   Current issues with Current Sleep Concerns;Current Stress Concerns    Source of Stress Concerns Chronic Illness      Family Dynamics   Good Support System? Yes   husband, 3 children live close     Barriers   Psychosocial barriers to participate in program There are no identifiable barriers or psychosocial needs.      Screening Interventions   Interventions Encouraged to exercise;Provide feedback about the scores to participant;To provide support and resources with identified psychosocial needs    Expected Outcomes Short Term goal: Utilizing psychosocial counselor, staff and physician to assist with identification of specific Stressors or current issues interfering with healing process. Setting desired goal for each stressor or current issue identified.;Long Term Goal: Stressors or current issues are controlled or eliminated.;Short Term goal: Identification and review with participant of any Quality of Life or Depression concerns found by scoring the questionnaire.;Long Term goal: The participant improves quality of Life and PHQ9 Scores as seen by post scores and/or verbalization of changes           Quality of Life Scores:   Quality of Life - 01/22/21 1317      Quality of Life   Select Quality of Life      Quality of Life Scores   Health/Function Pre 19.8 %    Socioeconomic Pre 24.38 %    Psych/Spiritual Pre 26.57 %  Family Pre 30 %    GLOBAL Pre 23.66 %          Scores of 19 and below usually indicate a poorer quality of life in these areas.  A difference of  2-3 points is a clinically meaningful  difference.  A difference of 2-3 points in the total score of the Quality of Life Index has been associated with significant improvement in overall quality of life, self-image, physical symptoms, and general health in studies assessing change in quality of life.  PHQ-9: Recent Review Flowsheet Data    Depression screen [facility] 2/9 01/22/2021   Decreased Interest 0   Down, Depressed, Hopeless 0   PHQ - 2 Score 0   Altered sleeping 1   Tired, decreased energy 1   Change in appetite 0   Feeling bad or failure about yourself  0   Trouble concentrating 0   Moving slowly or fidgety/restless 0   Suicidal thoughts 0   PHQ-9 Score 2   Difficult doing work/chores Not difficult at all     Interpretation of Total Score  Total Score Depression Severity:  1-4 = Minimal depression, 5-9 = Mild depression, 10-14 = Moderate depression, 15-19 = Moderately severe depression, 20-27 = Severe depression   Psychosocial Evaluation and Intervention:  Psychosocial Evaluation - 01/15/21 1100      Psychosocial Evaluation & Interventions   Comments Mrs. 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 She feels like she is over Covid and didn't even know she had it at the time. She still has some minor chest discomfort but nothing her MD is worried about. She is on Wellbutrin and reports it is still working. She states her husband and 3 local children are very supportive, one of her daughters comes over and cooks low sodium meals as well. Her sleep pattern hasnt changed after her MI and is still requiring some medication to help her stay asleep. Her balance is still an issue (she's completed PT in the past) and hopes to work on it while she is here. She does have some back pain related to history of colon cancer and bowel surgery. She is motivated to start cardiac rehab to help with strength and stamina    Expected Outcomes Short: attend cardiac rehab for education and exercise. Long: develop and maintain positive self  care habits.    Continue Psychosocial Services  Follow up required by staff           Psychosocial Re-Evaluation:  Psychosocial Re-Evaluation    Robinson Name 02/20/21 1431             Psychosocial Re-Evaluation   Current issues with None Identified       Comments Kamoria is doing well in rehab.  She has been able to go back to [employer] where she had her heart attack and felt pretty good going again.  She has a lot of support from her family, both her kids and her husband any time, any where.  She does not have any major stressors going on currently.  She is sleeping pretty well most nights.       Expected Outcomes Short: continue to lean on family for support Long: Continue to focus on the positive.       Interventions Encouraged to attend Cardiac Rehabilitation for the exercise       Continue Psychosocial Services  Follow up required by staff  Psychosocial Discharge (Final Psychosocial Re-Evaluation):  Psychosocial Re-Evaluation - 02/20/21 1431      Psychosocial Re-Evaluation   Current issues with None Identified    Comments Dorene is doing well in rehab.  She has been able to go back to [employer] where she had her heart attack and felt pretty good going again.  She has a lot of support from her family, both her kids and her husband any time, any where.  She does not have any major stressors going on currently.  She is sleeping pretty well most nights.    Expected Outcomes Short: continue to lean on family for support Long: Continue to focus on the positive.    Interventions Encouraged to attend Cardiac Rehabilitation for the exercise    Continue Psychosocial Services  Follow up required by staff           Vocational Rehabilitation: Provide vocational rehab assistance to qualifying candidates.   Vocational Rehab Evaluation & Intervention:  Vocational Rehab - 01/15/21 1043      Initial Vocational Rehab Evaluation & Intervention   Assessment shows need for  Vocational Rehabilitation No           Education: Education Goals: Education classes will be provided on a variety of topics geared toward better understanding of heart health and risk factor modification. Participant will state understanding/return demonstration of topics presented as noted by education test scores.  Learning Barriers/Preferences:  Learning Barriers/Preferences - 01/15/21 1043      Learning Barriers/Preferences   Learning Barriers None    Learning Preferences None           General Cardiac Education Topics:  AED/CPR: - Group verbal and written instruction with the use of models to demonstrate the basic use of the AED with the basic ABC's of resuscitation.   Anatomy and Cardiac Procedures: - Group verbal and visual presentation and models provide information about basic cardiac anatomy and function. Reviews the testing methods done to diagnose heart disease and the outcomes of the test results. Describes the treatment choices: Medical Management, Angioplasty, or Coronary Bypass Surgery for treating various heart conditions including Myocardial Infarction, Angina, Valve Disease, and Cardiac Arrhythmias.  Written material given at graduation. Flowsheet Row Cardiac Rehab from 03/05/2021 in [facility] Cardiac and Pulmonary Rehab  Date 02/12/21  Educator [facility]  Instruction Review Code 1- Verbalizes Understanding      Medication Safety: - Group verbal and visual instruction to review commonly prescribed medications for heart and lung disease. Reviews the medication, class of the drug, and side effects. Includes the steps to properly store meds and maintain the prescription regimen.  Written material given at graduation. Flowsheet Row Cardiac Rehab from 03/05/2021 in [facility] Cardiac and Pulmonary Rehab  Date 03/05/21  Educator [facility]  Instruction Review Code 1- Verbalizes Understanding      Intimacy: - Group verbal instruction through game format to discuss how heart and lung  disease can affect sexual intimacy. Written material given at graduation.. Flowsheet Row Cardiac Rehab from 03/05/2021 in [facility] Cardiac and Pulmonary Rehab  Date 02/05/21  Educator jh  Instruction Review Code 1- Verbalizes Understanding      Know Your Numbers and Heart Failure: - Group verbal and visual instruction to discuss disease risk factors for cardiac and pulmonary disease and treatment options.  Reviews associated critical values for Overweight/Obesity, Hypertension, Cholesterol, and Diabetes.  Discusses basics of heart failure: signs/symptoms and treatments.  Introduces Heart Failure Zone chart for action plan for heart failure.  Written material  given at graduation.   Infection Prevention: - Provides verbal and written material to individual with discussion of infection control including proper hand washing and proper equipment cleaning during exercise session. Flowsheet Row Cardiac Rehab from 03/05/2021 in [facility] Cardiac and Pulmonary Rehab  Education need identified 01/22/21  Date 01/22/21  Educator [place]  Instruction Review Code 1- Verbalizes Understanding      Falls Prevention: - Provides verbal and written material to individual with discussion of falls prevention and safety. Flowsheet Row Cardiac Rehab from 03/05/2021 in [facility] Cardiac and Pulmonary Rehab  Education need identified 01/22/21  Date 01/22/21  Educator [place]  Instruction Review Code 1- Verbalizes Understanding      Other: -Provides group and verbal instruction on various topics (see comments)   Knowledge Questionnaire Score:  Knowledge Questionnaire Score - 01/22/21 1317      Knowledge Questionnaire Score   Pre Score 21/26: Angina, A&P, exercise, nutrition           Core Components/Risk Factors/Patient Goals at Admission:  Personal Goals and Risk Factors at Admission - 01/22/21 1308      Core Components/Risk Factors/Patient Goals on Admission    Weight Management Yes;Weight Loss    Intervention Weight  Management: Develop a combined nutrition and exercise program designed to reach desired caloric intake, while maintaining appropriate intake of nutrient and fiber, sodium and fats, and appropriate energy expenditure required for the weight goal.;Weight Management: Provide education and appropriate resources to help participant work on and attain dietary goals.;Weight Management/Obesity: Establish reasonable short term and long term weight goals.    Admit Weight 160 lb (72.6 kg)    Goal Weight: Short Term 157 lb (71.2 kg)    Goal Weight: Long Term 153 lb (69.4 kg)    Expected Outcomes Short Term: Continue to assess and modify interventions until short term weight is achieved;Long Term: Adherence to nutrition and physical activity/exercise program aimed toward attainment of established weight goal;Weight Loss: Understanding of general recommendations for a balanced deficit meal plan, which promotes 1-2 lb weight loss per week and includes a negative energy balance of [phone] kcal/d;Understanding recommendations for meals to include 15-35% energy as protein, 25-35% energy from fat, 35-60% energy from carbohydrates, less than 200mg  of dietary cholesterol, 20-35 gm of total fiber daily;Understanding of distribution of calorie intake throughout the day with the consumption of 4-5 meals/snacks    Heart Failure Yes    Intervention Provide a combined exercise and nutrition program that is supplemented with education, support and counseling about heart failure. Directed toward relieving symptoms such as shortness of breath, decreased exercise tolerance, and extremity edema.    Expected Outcomes Improve functional capacity of life;Short term: Attendance in program 2-3 days a week with increased exercise capacity. Reported lower sodium intake. Reported increased fruit and vegetable intake. Reports medication compliance.;Short term: Daily weights obtained and reported for increase. Utilizing diuretic protocols set by  physician.;Long term: Adoption of self-care skills and reduction of barriers for early signs and symptoms recognition and intervention leading to self-care maintenance.    Hypertension Yes    Intervention Provide education on lifestyle modifcations including regular physical activity/exercise, weight management, moderate sodium restriction and increased consumption of fresh fruit, vegetables, and low fat dairy, alcohol moderation, and smoking cessation.;Monitor prescription use compliance.    Expected Outcomes Short Term: Continued assessment and intervention until BP is < 140/13mm HG in hypertensive participants. < 130/55mm HG in hypertensive participants with diabetes, heart failure or chronic kidney disease.;Long Term: Maintenance of blood pressure at  goal levels.    Lipids Yes    Intervention Provide education and support for participant on nutrition & aerobic/resistive exercise along with prescribed medications to achieve LDL 70mg , HDL >40mg .    Expected Outcomes Short Term: Participant states understanding of desired cholesterol values and is compliant with medications prescribed. Participant is following exercise prescription and nutrition guidelines.;Long Term: Cholesterol controlled with medications as prescribed, with individualized exercise RX and with personalized nutrition plan. Value goals: LDL < 70mg , HDL > 40 mg.           Education:Diabetes - Individual verbal and written instruction to review signs/symptoms of diabetes, desired ranges of glucose level fasting, after meals and with exercise. Acknowledge that pre and post exercise glucose checks will be done for 3 sessions at entry of program.   Core Components/Risk Factors/Patient Goals Review:   Goals and Risk Factor Review    Row Name 02/20/21 1434             Core Components/Risk Factors/Patient Goals Review   Personal Goals Review Weight Management/Obesity;Heart Failure;Hypertension       Review Rexann is doing well  in rehab.  Her weight is coming down. She is down to 157 now which down almost 10 lb since her event.  She denies any real symptoms of heart failure.  She only notices the SOB when she is really exerting herself.  She is able to do more aroud the house currently.  She is doing well with watching her sodium intake.  She is reading all the food labels.  Her daughter helps her by cooking meals and them limit their salt overall.  Her pressures have been doing really well. She says they are the lowest they have been since prior to her hysterectomy.  She is feeling pretty good overall with her progress.       Expected Outcomes Short: Continue to work on weight loss Long: Continue to monitor risk factors.              Core Components/Risk Factors/Patient Goals at Discharge (Final Review):   Goals and Risk Factor Review - 02/20/21 1434      Core Components/Risk Factors/Patient Goals Review   Personal Goals Review Weight Management/Obesity;Heart Failure;Hypertension    Review Rayona is doing well in rehab.  Her weight is coming down. She is down to 157 now which down almost 10 lb since her event.  She denies any real symptoms of heart failure.  She only notices the SOB when she is really exerting herself.  She is able to do more aroud the house currently.  She is doing well with watching her sodium intake.  She is reading all the food labels.  Her daughter helps her by cooking meals and them limit their salt overall.  Her pressures have been doing really well. She says they are the lowest they have been since prior to her hysterectomy.  She is feeling pretty good overall with her progress.    Expected Outcomes Short: Continue to work on weight loss Long: Continue to monitor risk factors.           ITP Comments:  ITP Comments    Row Name 01/15/21 1034 01/22/21 1242 01/23/21 1355 02/12/21 0703 03/12/21 1010   ITP Comments Initial telephone orientation completed. Diagnosis can be found in CHL 1/2. EP  orientation scheduled for 2/2 at 11am. Completed 6MWT and gym orientation. Initial ITP created and sent for review to Dr. Emily Filbert, Medical Director. First full day  of exercise!  Patient was oriented to gym and equipment including functions, settings, policies, and procedures.  Patient's individual exercise prescription and treatment plan were reviewed.  All starting workloads were established based on the results of the 6 minute walk test done at initial orientation visit.  The plan for exercise progression was also introduced and progression will be customized based on patient's performance and goals. 30 Day review completed. Medical Director ITP review done, changes made as directed, and signed approval by Medical Director. 30 Day review completed. Medical Director ITP review done, changes made as directed, and signed approval by Medical Director.          Comments:

## 2021-03-12 NOTE — Progress Notes (Signed)
Daily Session Note  Patient Details  Name: Karen Madden MRN: [id number] Date of Birth: 1946-10-11 Referring Provider:   Flowsheet Row Cardiac Rehab from 01/22/2021 in [facility] Cardiac and Pulmonary Rehab  Referring Provider Lujean Amel MD      Encounter Date: 03/12/2021  Check In:  Session Check In - 03/12/21 1409      Check-In   Supervising physician immediately available to respond to emergencies See telemetry face sheet for immediately available ER MD    Location [facility]-Cardiac & Pulmonary Rehab    Staff Present Birdie Sons, MPA, RN;Joseph Lou Miner, [place] Exercise Physiologist    Virtual Visit No    Medication changes reported     No    Fall or balance concerns reported    No    Warm-up and Cool-down Performed on first and last piece of equipment    Resistance Training Performed Yes    VAD Patient? No    PAD/SET Patient? No      Pain Assessment   Currently in Pain? No/denies              Social History   Tobacco Use  Smoking Status Never Smoker  Smokeless Tobacco Never Used    Goals Met:  Independence with exercise equipment Exercise tolerated well No report of cardiac concerns or symptoms Strength training completed today  Goals Unmet:  Not Applicable  Comments: Pt able to follow exercise prescription today without complaint.  Will continue to monitor for progression.    Dr. Emily Filbert is Medical Director for [place] and [facility].

## 2021-03-13 ENCOUNTER — Other Ambulatory Visit: Payer: Self-pay

## 2021-03-13 ENCOUNTER — Encounter: Payer: Medicare Other | Admitting: *Deleted

## 2021-03-13 DIAGNOSIS — I213 ST elevation (STEMI) myocardial infarction of unspecified site: Secondary | ICD-10-CM

## 2021-03-13 DIAGNOSIS — Z955 Presence of coronary angioplasty implant and graft: Secondary | ICD-10-CM

## 2021-03-13 NOTE — Progress Notes (Signed)
Daily Session Note  Patient Details  Name: Karen Madden MRN: [id number] Date of Birth: Sep 12, 1946 Referring Provider:   Flowsheet Row Cardiac Rehab from 01/22/2021 in [facility] Cardiac and Pulmonary Rehab  Referring Provider Lujean Amel MD      Encounter Date: 03/13/2021  Check In:  Session Check In - 03/13/21 1402      Check-In   Supervising physician immediately available to respond to emergencies See telemetry face sheet for immediately available ER MD    Location [facility]-Cardiac & Pulmonary Rehab    Staff Present Renita Papa, RN BSN;Joseph [address] [place], [place], [place], CCRP, CCET    Virtual Visit No    Medication changes reported     No    Fall or balance concerns reported    No    Warm-up and Cool-down Performed on first and last piece of equipment    Resistance Training Performed Yes    VAD Patient? No      Pain Assessment   Currently in Pain? No/denies              Social History   Tobacco Use  Smoking Status Never Smoker  Smokeless Tobacco Never Used    Goals Met:  Independence with exercise equipment Exercise tolerated well No report of cardiac concerns or symptoms Strength training completed today  Goals Unmet:  Not Applicable  Comments: Pt able to follow exercise prescription today without complaint.  Will continue to monitor for progression.    Dr. Emily Filbert is Medical Director for [place] and [facility].

## 2021-03-17 ENCOUNTER — Other Ambulatory Visit: Payer: Self-pay

## 2021-03-17 DIAGNOSIS — I213 ST elevation (STEMI) myocardial infarction of unspecified site: Secondary | ICD-10-CM | POA: Diagnosis not present

## 2021-03-17 DIAGNOSIS — Z955 Presence of coronary angioplasty implant and graft: Secondary | ICD-10-CM

## 2021-03-17 NOTE — Progress Notes (Signed)
Daily Session Note  Patient Details  Name: BONNELL PLACZEK MRN: [id number] Date of Birth: 1946/07/24 Referring Provider:   Flowsheet Row Cardiac Rehab from 01/22/2021 in [facility] Cardiac and Pulmonary Rehab  Referring Provider Lujean Amel MD      Encounter Date: 03/17/2021  Check In:  Session Check In - 03/17/21 1415      Check-In   Supervising physician immediately available to respond to emergencies See telemetry face sheet for immediately available ER MD    Location [facility]-Cardiac & Pulmonary Rehab    Staff Present Birdie Sons, MPA, Mauricia Area, BS, ACSM CEP, Exercise Physiologist;Meredith Sherryll Burger, RN BSN    Virtual Visit No    Medication changes reported     No    Fall or balance concerns reported    No    Warm-up and Cool-down Performed on first and last piece of equipment    Resistance Training Performed Yes    VAD Patient? No    PAD/SET Patient? No      Pain Assessment   Currently in Pain? No/denies              Social History   Tobacco Use  Smoking Status Never Smoker  Smokeless Tobacco Never Used    Goals Met:  Independence with exercise equipment Exercise tolerated well No report of cardiac concerns or symptoms Strength training completed today  Goals Unmet:  Not Applicable  Comments: Pt able to follow exercise prescription today without complaint.  Will continue to monitor for progression.    Dr. Emily Filbert is Medical Director for [place] and [facility].

## 2021-03-19 ENCOUNTER — Other Ambulatory Visit: Payer: Self-pay

## 2021-03-19 DIAGNOSIS — I213 ST elevation (STEMI) myocardial infarction of unspecified site: Secondary | ICD-10-CM

## 2021-03-19 DIAGNOSIS — Z955 Presence of coronary angioplasty implant and graft: Secondary | ICD-10-CM

## 2021-03-19 NOTE — Progress Notes (Signed)
Daily Session Note  Patient Details  Name: Karen Madden MRN: [id number] Date of Birth: [birth year] Referring Provider:   Flowsheet Row Cardiac Rehab from 01/22/2021 in [facility] Cardiac and Pulmonary Rehab  Referring Provider Lujean Amel MD      Encounter Date: 03/19/2021  Check In:  Session Check In - 03/19/21 1408      Check-In   Supervising physician immediately available to respond to emergencies See telemetry face sheet for immediately available ER MD    Location [facility]-Cardiac & Pulmonary Rehab    Staff Present Birdie Sons, MPA, RN;Melissa Caiola RDN, LDN;Susanne Bice, RN, BSN, CCRP;Jessica [place], MA, RCEP, CCRP, CCET    Virtual Visit No    Medication changes reported     No    Fall or balance concerns reported    No    Warm-up and Cool-down Performed on first and last piece of equipment    Resistance Training Performed Yes    VAD Patient? No    PAD/SET Patient? No      Pain Assessment   Currently in Pain? No/denies              Social History   Tobacco Use  Smoking Status Never Smoker  Smokeless Tobacco Never Used    Goals Met:  Independence with exercise equipment Exercise tolerated well No report of cardiac concerns or symptoms Strength training completed today  Goals Unmet:  Not Applicable  Comments: Pt able to follow exercise prescription today without complaint.  Will continue to monitor for progression.    Dr. Emily Filbert is Medical Director for [place] and [facility].

## 2021-03-20 ENCOUNTER — Encounter: Payer: Medicare Other | Admitting: *Deleted

## 2021-03-20 DIAGNOSIS — I213 ST elevation (STEMI) myocardial infarction of unspecified site: Secondary | ICD-10-CM | POA: Diagnosis not present

## 2021-03-20 DIAGNOSIS — Z955 Presence of coronary angioplasty implant and graft: Secondary | ICD-10-CM

## 2021-03-20 NOTE — Progress Notes (Signed)
Daily Session Note  Patient Details  Name: Karen Madden MRN: [id number] Date of Birth: Mar 30, 1946 Referring Provider:   Flowsheet Row Cardiac Rehab from 01/22/2021 in [facility] Cardiac and Pulmonary Rehab  Referring Provider Lujean Amel MD      Encounter Date: 03/20/2021  Check In:  Session Check In - 03/20/21 1358      Check-In   Supervising physician immediately available to respond to emergencies See telemetry face sheet for immediately available ER MD    Location [facility]-Cardiac & Pulmonary Rehab    Staff Present Renita Papa, RN BSN;Joseph [address] [place], [place], RCEP, CCRP, CCET    Virtual Visit No    Medication changes reported     No    Fall or balance concerns reported    No    Warm-up and Cool-down Performed on first and last piece of equipment    Resistance Training Performed Yes    VAD Patient? No    PAD/SET Patient? No      Pain Assessment   Currently in Pain? No/denies              Social History   Tobacco Use  Smoking Status Never Smoker  Smokeless Tobacco Never Used    Goals Met:  Independence with exercise equipment Exercise tolerated well No report of cardiac concerns or symptoms Strength training completed today  Goals Unmet:  Not Applicable  Comments: Pt able to follow exercise prescription today without complaint.  Will continue to monitor for progression.    Dr. Emily Filbert is Medical Director for [place] and [facility].

## 2021-03-24 ENCOUNTER — Encounter: Payer: Medicare Other | Attending: Internal Medicine

## 2021-03-24 ENCOUNTER — Other Ambulatory Visit: Payer: Self-pay

## 2021-03-24 DIAGNOSIS — I213 ST elevation (STEMI) myocardial infarction of unspecified site: Secondary | ICD-10-CM | POA: Diagnosis present

## 2021-03-24 DIAGNOSIS — Z955 Presence of coronary angioplasty implant and graft: Secondary | ICD-10-CM | POA: Diagnosis present

## 2021-03-24 NOTE — Progress Notes (Signed)
Daily Session Note  Patient Details  Name: Karen Madden MRN: [id number] Date of Birth: 01-07-1946 Referring Provider:   Flowsheet Row Cardiac Rehab from 01/22/2021 in [facility] Cardiac and Pulmonary Rehab  Referring Provider Lujean Amel MD      Encounter Date: 03/24/2021  Check In:  Session Check In - 03/24/21 1405      Check-In   Supervising physician immediately available to respond to emergencies See telemetry face sheet for immediately available ER MD    Location [facility]-Cardiac & Pulmonary Rehab    Staff Present Birdie Sons, MPA, Mauricia Area, BS, ACSM CEP, Exercise Physiologist;Kara Eliezer Bottom, MS Exercise Physiologist    Virtual Visit No    Medication changes reported     No    Fall or balance concerns reported    No    Warm-up and Cool-down Performed on first and last piece of equipment    Resistance Training Performed Yes    VAD Patient? No    PAD/SET Patient? No      Pain Assessment   Currently in Pain? No/denies              Social History   Tobacco Use  Smoking Status Never Smoker  Smokeless Tobacco Never Used    Goals Met:  Independence with exercise equipment Exercise tolerated well No report of cardiac concerns or symptoms Strength training completed today  Goals Unmet:  Not Applicable  Comments: Pt able to follow exercise prescription today without complaint.  Will continue to monitor for progression.    Dr. Emily Filbert is Medical Director for [facility] and [facility].

## 2021-03-26 ENCOUNTER — Other Ambulatory Visit: Payer: Self-pay

## 2021-03-26 VITALS — Ht 64.5 in | Wt 161.2 lb

## 2021-03-26 DIAGNOSIS — Z955 Presence of coronary angioplasty implant and graft: Secondary | ICD-10-CM

## 2021-03-26 DIAGNOSIS — I213 ST elevation (STEMI) myocardial infarction of unspecified site: Secondary | ICD-10-CM

## 2021-03-26 NOTE — Progress Notes (Signed)
Daily Session Note  Patient Details  Name: TYLENE QUASHIE MRN: [id number] Date of Birth: 1946/09/23 Referring Provider:   Flowsheet Row Cardiac Rehab from 01/22/2021 in [facility] Cardiac and Pulmonary Rehab  Referring Provider Lujean Amel MD      Encounter Date: 03/26/2021  Check In:  Session Check In - 03/26/21 1402      Check-In   Supervising physician immediately available to respond to emergencies See telemetry face sheet for immediately available ER MD    Location [facility]-Cardiac & Pulmonary Rehab    Staff Present Birdie Sons, MPA, RN;Meredith Sherryll Burger, RN Margurite Auerbach, MS Exercise Physiologist    Virtual Visit No    Medication changes reported     No    Fall or balance concerns reported    No    Warm-up and Cool-down Performed on first and last piece of equipment    Resistance Training Performed Yes    VAD Patient? No    PAD/SET Patient? No      Pain Assessment   Currently in Pain? No/denies              Social History   Tobacco Use  Smoking Status Never Smoker  Smokeless Tobacco Never Used    Goals Met:  Independence with exercise equipment Exercise tolerated well No report of cardiac concerns or symptoms Strength training completed today  Goals Unmet:  Not Applicable  Comments: Pt able to follow exercise prescription today without complaint.  Will continue to monitor for progression.   [place] Name 01/22/21 1257 03/26/21 1439       6 Minute Walk   Phase Initial Discharge    Distance 915 feet 1310 feet    Distance % Change -- 43.2 %    Distance Feet Change -- 395 ft    Walk Time 5 minutes  break 2:49-3:45 6 minutes    # of Rest Breaks 1 0    MPH 2.07 2.48    METS 1.7 2.76    RPE 11 14    Perceived Dyspnea  1 2    VO2 Peak 5.95 9.67    Symptoms Yes (comment) Yes (comment)    Comments Legs fatigued chronic back pain 4/10    Resting HR 70 bpm 55 bpm    Resting BP 100/64 112/56    Resting Oxygen Saturation  96 % --    Exercise  Oxygen Saturation  during 6 min walk 99 % --    Max Ex. HR 87 bpm 107 bpm    Max Ex. BP 116/64 142/74    2 Minute Post BP 98/62 --            Dr. Emily Filbert is Medical Director for [place] and [facility].

## 2021-03-27 ENCOUNTER — Encounter: Payer: Medicare Other | Admitting: *Deleted

## 2021-03-27 ENCOUNTER — Other Ambulatory Visit: Payer: Self-pay

## 2021-03-27 DIAGNOSIS — Z955 Presence of coronary angioplasty implant and graft: Secondary | ICD-10-CM | POA: Diagnosis not present

## 2021-03-27 DIAGNOSIS — I213 ST elevation (STEMI) myocardial infarction of unspecified site: Secondary | ICD-10-CM

## 2021-03-27 NOTE — Progress Notes (Signed)
Daily Session Note  Patient Details  Name: YARESLY MENZEL MRN: [id number] Date of Birth: 1946/03/05 Referring Provider:   Flowsheet Row Cardiac Rehab from 01/22/2021 in [facility] Cardiac and Pulmonary Rehab  Referring Provider Lujean Amel MD      Encounter Date: 03/27/2021  Check In:  Session Check In - 03/27/21 1401      Check-In   Supervising physician immediately available to respond to emergencies See telemetry face sheet for immediately available ER MD    Location [facility]-Cardiac & Pulmonary Rehab    Staff Present Renita Papa, RN BSN;Joseph [address] [place], [place], RCEP, CCRP, CCET    Virtual Visit No    Medication changes reported     No    Fall or balance concerns reported    No    Warm-up and Cool-down Performed on first and last piece of equipment    Resistance Training Performed Yes    VAD Patient? No    PAD/SET Patient? No      Pain Assessment   Currently in Pain? No/denies              Social History   Tobacco Use  Smoking Status Never Smoker  Smokeless Tobacco Never Used    Goals Met:  Independence with exercise equipment Exercise tolerated well No report of cardiac concerns or symptoms Strength training completed today  Goals Unmet:  Not Applicable  Comments: Pt able to follow exercise prescription today without complaint.  Will continue to monitor for progression.    Dr. Emily Filbert is Medical Director for [place] [facility].

## 2021-03-31 ENCOUNTER — Other Ambulatory Visit: Payer: Self-pay

## 2021-03-31 DIAGNOSIS — Z955 Presence of coronary angioplasty implant and graft: Secondary | ICD-10-CM

## 2021-03-31 DIAGNOSIS — I213 ST elevation (STEMI) myocardial infarction of unspecified site: Secondary | ICD-10-CM

## 2021-03-31 NOTE — Progress Notes (Signed)
Daily Session Note  Patient Details  Name: BEENA CATANO MRN: [id number] Date of Birth: 09/06/1946 Referring Provider:   Flowsheet Row Cardiac Rehab from 01/22/2021 in [facility] Cardiac and Pulmonary Rehab  Referring Provider Lujean Amel MD      Encounter Date: 03/31/2021  Check In:  Session Check In - 03/31/21 1400      Check-In   Supervising physician immediately available to respond to emergencies See telemetry face sheet for immediately available ER MD    Location [facility]-Cardiac & Pulmonary Rehab    Staff Present Birdie Sons, MPA, Mauricia Area, BS, ACSM CEP, Exercise Physiologist;Kara Eliezer Bottom, MS Exercise Physiologist;Joseph Tessie Fass RCP,RRT,BSRT    Virtual Visit No    Medication changes reported     No    Fall or balance concerns reported    No    Warm-up and Cool-down Performed on first and last piece of equipment    Resistance Training Performed Yes    VAD Patient? No    PAD/SET Patient? No      Pain Assessment   Currently in Pain? No/denies              Social History   Tobacco Use  Smoking Status Never Smoker  Smokeless Tobacco Never Used    Goals Met:  Independence with exercise equipment Exercise tolerated well No report of cardiac concerns or symptoms Strength training completed today  Goals Unmet:  Not Applicable  Comments: Pt able to follow exercise prescription today without complaint.  Will continue to monitor for progression.    Dr. Emily Filbert is Medical Director for [place] and [facility].

## 2021-04-01 NOTE — Patient Instructions (Signed)
Discharge Patient Instructions  Patient Details  Name: Karen Madden MRN: [id number] Date of Birth: [birth year] Referring Provider:  Yolonda Kida, MD   Number of Visits: 27  Reason for Discharge:  Patient reached a stable level of exercise. Patient independent in their exercise. Patient has met program and personal goals.  Smoking History:  Social History   Tobacco Use  Smoking Status Never Smoker  Smokeless Tobacco Never Used    Diagnosis:  ST elevation myocardial infarction (STEMI), unspecified artery (HCC)  Status post coronary artery stent placement  Initial Exercise Prescription:  Initial Exercise Prescription - 01/22/21 1200      Date of Initial Exercise RX and Referring Provider   Date 01/22/21    Referring Provider Lujean Amel MD      Recumbant Bike   Level 1    RPM 60    Watts 10    Minutes 15    METs 1.7      NuStep   Level 1    SPM 80    Minutes 15    METs 1.7      T5 Nustep   Level 1    SPM 80    Minutes 15    METs 1.7      Prescription Details   Frequency (times per week) 3    Duration Progress to 30 minutes of continuous aerobic without signs/symptoms of physical distress      Intensity   THRR 40-80% of Max Heartrate 100-130    Ratings of Perceived Exertion 11-13    Perceived Dyspnea 0-4      Progression   Progression Continue to progress workloads to maintain intensity without signs/symptoms of physical distress.      Resistance Training   Training Prescription Yes    Weight 3 lb    Reps 10-15           Discharge Exercise Prescription (Final Exercise Prescription Changes):  Exercise Prescription Changes - 03/25/21 1600      Response to Exercise   Blood Pressure (Admit) 122/60    Blood Pressure (Exercise) 124/62    Blood Pressure (Exit) 104/64    Heart Rate (Admit) 74 bpm    Heart Rate (Exercise) 109 bpm    Heart Rate (Exit) 88 bpm    Rating of Perceived Exertion (Exercise) 14    Symptoms none    Duration  Continue with 30 min of aerobic exercise without signs/symptoms of physical distress.    Intensity THRR unchanged      Progression   Progression Continue to progress workloads to maintain intensity without signs/symptoms of physical distress.    Average METs 2.67      Resistance Training   Training Prescription Yes    Weight 3 lb    Reps 10-15      Interval Training   Interval Training No      Treadmill   MPH 2    Grade 0    Minutes 15    METs 2.53      Recumbant Bike   Level 2    Minutes 15    METs 2.7      NuStep   Level 4    Minutes 15    METs 3.5      T5 Nustep   Level 3    Minutes 15    METs 1.9      Home Exercise Plan   Plans to continue exercise at Home (comment)   walk, YouTube videos  Frequency Add 2 additional days to program exercise sessions.    Initial Home Exercises Provided 02/12/21           Functional Capacity:  6 Minute Walk    Row Name 01/22/21 1257 03/26/21 1439       6 Minute Walk   Phase Initial Discharge    Distance 915 feet 1310 feet    Distance % Change -- 43.2 %    Distance Feet Change -- 395 ft    Walk Time 5 minutes  break 2:49-3:45 6 minutes    # of Rest Breaks 1 0    MPH 2.07 2.48    METS 1.7 2.76    RPE 11 14    Perceived Dyspnea  1 2    VO2 Peak 5.95 9.67    Symptoms Yes (comment) Yes (comment)    Comments Legs fatigued chronic back pain 4/10    Resting HR 70 bpm 55 bpm    Resting BP 100/64 112/56    Resting Oxygen Saturation  96 % --    Exercise Oxygen Saturation  during 6 min walk 99 % --    Max Ex. HR 87 bpm 107 bpm    Max Ex. BP 116/64 142/74    2 Minute Post BP 98/62 --          Nutrition & Weight - Outcomes:  Pre Biometrics - 01/22/21 1307      Pre Biometrics   Height 5' 4.5" (1.638 m)    Weight 160 lb 11.2 oz (72.9 kg)    BMI (Calculated) 27.17    Single Leg Stand 9.3 seconds           Post Biometrics - 03/26/21 1441       Post  Biometrics   Height 5' 4.5" (1.638 m)    Weight 161 lb 3.2  oz (73.1 kg)    BMI (Calculated) 27.25           Nutrition:  Nutrition Therapy & Goals - 02/24/21 1504      Nutrition Therapy   Diet Heart healthy, low Na    Drug/Food Interactions Statins/Certain Fruits    Protein (specify units) 60g    Fiber 25 grams    Whole Grain Foods 3 servings    Saturated Fats 12 max. grams    Fruits and Vegetables 8 servings/day    Sodium 1.5 grams      Personal Nutrition Goals   Nutrition Goal ST: calculate sodium using food labels to see what largest source is and if chnages are needed LT: limit sodium to 154m or less per day    Comments B: banana and sometimes piece of toast with butter and peach preserves and low sodium cranberry muffins or great grains with almond milk L: half of sandwich with low salt pimento cheese or PB, cottage cheese and fruit D: chicken or [place] low Na butter on potato and oil and vinegar salad. She does not eat red meat - only chicken, [place] and sometimes fish.  Drinks: water, 1 cup of coffee in am with half and half, ginger tea and honey in evening. She doesn't have a big appetite since heart attack. She has had colon cancer and had a resection of bowel which affects her. She takes amodium to help with that. Sweet potatoes affect her as does tomato sauce. Discussed heart healthy eating.      Intervention Plan   Intervention Prescribe, educate and counsel regarding individualized specific dietary modifications aiming towards targeted  core components such as weight, hypertension, lipid management, diabetes, heart failure and other comorbidities.;Nutrition handout(s) given to patient.    Expected Outcomes Short Term Goal: Understand basic principles of dietary content, such as calories, fat, sodium, cholesterol and nutrients.;Short Term Goal: A plan has been developed with personal nutrition goals set during dietitian appointment.;Long Term Goal: Adherence to prescribed nutrition plan.            Goals reviewed with patient;  copy given to patient.

## 2021-04-02 ENCOUNTER — Other Ambulatory Visit: Payer: Self-pay

## 2021-04-02 DIAGNOSIS — Z955 Presence of coronary angioplasty implant and graft: Secondary | ICD-10-CM | POA: Diagnosis not present

## 2021-04-02 DIAGNOSIS — I213 ST elevation (STEMI) myocardial infarction of unspecified site: Secondary | ICD-10-CM

## 2021-04-02 NOTE — Progress Notes (Signed)
Discharge Progress Report  Patient Details  Name: Karen Madden MRN: [id number] Date of Birth: Oct 24, 1946 Referring Provider:   Flowsheet Row Cardiac Rehab from 01/22/2021 in [facility] Cardiac and Pulmonary Rehab  Referring Provider Lujean Amel MD       Number of Visits: 62  Reason for Discharge:  Patient reached a stable level of exercise. Patient independent in their exercise. Patient has met program and personal goals.  Smoking History:  Social History   Tobacco Use  Smoking Status Never Smoker  Smokeless Tobacco Never Used    Diagnosis:  ST elevation myocardial infarction (STEMI), unspecified artery (HCC)  Status post coronary artery stent placement  ADL UCSD:   Initial Exercise Prescription:  Initial Exercise Prescription - 01/22/21 1200      Date of Initial Exercise RX and Referring Provider   Date 01/22/21    Referring Provider Lujean Amel MD      Recumbant Bike   Level 1    RPM 60    Watts 10    Minutes 15    METs 1.7      NuStep   Level 1    SPM 80    Minutes 15    METs 1.7      T5 Nustep   Level 1    SPM 80    Minutes 15    METs 1.7      Prescription Details   Frequency (times per week) 3    Duration Progress to 30 minutes of continuous aerobic without signs/symptoms of physical distress      Intensity   THRR 40-80% of Max Heartrate 100-130    Ratings of Perceived Exertion 11-13    Perceived Dyspnea 0-4      Progression   Progression Continue to progress workloads to maintain intensity without signs/symptoms of physical distress.      Resistance Training   Training Prescription Yes    Weight 3 lb    Reps 10-15           Discharge Exercise Prescription (Final Exercise Prescription Changes):  Exercise Prescription Changes - 03/25/21 1600      Response to Exercise   Blood Pressure (Admit) 122/60    Blood Pressure (Exercise) 124/62    Blood Pressure (Exit) 104/64    Heart Rate (Admit) 74 bpm    Heart Rate (Exercise) 109  bpm    Heart Rate (Exit) 88 bpm    Rating of Perceived Exertion (Exercise) 14    Symptoms none    Duration Continue with 30 min of aerobic exercise without signs/symptoms of physical distress.    Intensity THRR unchanged      Progression   Progression Continue to progress workloads to maintain intensity without signs/symptoms of physical distress.    Average METs 2.67      Resistance Training   Training Prescription Yes    Weight 3 lb    Reps 10-15      Interval Training   Interval Training No      Treadmill   MPH 2    Grade 0    Minutes 15    METs 2.53      Recumbant Bike   Level 2    Minutes 15    METs 2.7      NuStep   Level 4    Minutes 15    METs 3.5      T5 Nustep   Level 3    Minutes 15    METs 1.9  Home Exercise Plan   Plans to continue exercise at Home (comment)   walk, YouTube videos   Frequency Add 2 additional days to program exercise sessions.    Initial Home Exercises Provided 02/12/21           Functional Capacity:  6 Minute Walk    Row Name 01/22/21 1257 03/26/21 1439       6 Minute Walk   Phase Initial Discharge    Distance 915 feet 1310 feet    Distance % Change -- 43.2 %    Distance Feet Change -- 395 ft    Walk Time 5 minutes  break 2:49-3:45 6 minutes    # of Rest Breaks 1 0    MPH 2.07 2.48    METS 1.7 2.76    RPE 11 14    Perceived Dyspnea  1 2    VO2 Peak 5.95 9.67    Symptoms Yes (comment) Yes (comment)    Comments Legs fatigued chronic back pain 4/10    Resting HR 70 bpm 55 bpm    Resting BP 100/64 112/56    Resting Oxygen Saturation  96 % --    Exercise Oxygen Saturation  during 6 min walk 99 % --    Max Ex. HR 87 bpm 107 bpm    Max Ex. BP 116/64 142/74    2 Minute Post BP 98/62 --           Psychological, QOL, Others - Outcomes: PHQ 2/9: Depression screen [facility] 2/9 03/31/2021 01/22/2021  Decreased Interest 2 0  Down, Depressed, Hopeless 2 0  PHQ - 2 Score 4 0  Altered sleeping 2 1  Tired, decreased  energy 2 1  Change in appetite 1 0  Feeling bad or failure about yourself  0 0  Trouble concentrating 0 0  Moving slowly or fidgety/restless 0 0  Suicidal thoughts 0 0  PHQ-9 Score 9 2  Difficult doing work/chores Somewhat difficult Not difficult at all    Quality of Life:  Quality of Life - 03/31/21 1420      Quality of Life Scores   Health/Function Pre 19.8 %    Health/Function Post 15.13 %    Health/Function % Change -23.59 %    Socioeconomic Pre 24.38 %    Socioeconomic Post 27 %    Socioeconomic % Change  10.75 %    Psych/Spiritual Pre 26.57 %    Psych/Spiritual Post 23.14 %    Psych/Spiritual % Change -12.91 %    Family Pre 30 %    Family Post 19.2 %    Family % Change -36 %    GLOBAL Pre 23.66 %    GLOBAL Post 20.03 %    GLOBAL % Change -15.34 %           Nutrition & Weight - Outcomes:  Pre Biometrics - 01/22/21 1307      Pre Biometrics   Height 5' 4.5" (1.638 m)    Weight 160 lb 11.2 oz (72.9 kg)    BMI (Calculated) 27.17    Single Leg Stand 9.3 seconds           Post Biometrics - 03/26/21 1441       Post  Biometrics   Height 5' 4.5" (1.638 m)    Weight 161 lb 3.2 oz (73.1 kg)    BMI (Calculated) 27.25           Nutrition:  Nutrition Therapy & Goals - 02/24/21 1504  Nutrition Therapy   Diet Heart healthy, low Na    Drug/Food Interactions Statins/Certain Fruits    Protein (specify units) 60g    Fiber 25 grams    Whole Grain Foods 3 servings    Saturated Fats 12 max. grams    Fruits and Vegetables 8 servings/day    Sodium 1.5 grams      Personal Nutrition Goals   Nutrition Goal ST: calculate sodium using food labels to see what largest source is and if chnages are needed LT: limit sodium to 1516m or less per day    Comments B: banana and sometimes piece of toast with butter and peach preserves and low sodium cranberry muffins or great grains with almond milk L: half of sandwich with low salt pimento cheese or PB, cottage cheese and  fruit D: chicken or [place] low Na butter on potato and oil and vinegar salad. She does not eat red meat - only chicken, [place] and sometimes fish.  Drinks: water, 1 cup of coffee in am with half and half, ginger tea and honey in evening. She doesn't have a big appetite since heart attack. She has had colon cancer and had a resection of bowel which affects her. She takes amodium to help with that. Sweet potatoes affect her as does tomato sauce. Discussed heart healthy eating.      Intervention Plan   Intervention Prescribe, educate and counsel regarding individualized specific dietary modifications aiming towards targeted core components such as weight, hypertension, lipid management, diabetes, heart failure and other comorbidities.;Nutrition handout(s) given to patient.    Expected Outcomes Short Term Goal: Understand basic principles of dietary content, such as calories, fat, sodium, cholesterol and nutrients.;Short Term Goal: A plan has been developed with personal nutrition goals set during dietitian appointment.;Long Term Goal: Adherence to prescribed nutrition plan.           Nutrition Discharge:   Education Questionnaire Score:  Knowledge Questionnaire Score - 03/31/21 1420      Knowledge Questionnaire Score   Pre Score 21/26: Angina, A&P, exercise, nutrition    Post Score 23/26           Goals reviewed with patient; copy given to patient.

## 2021-04-02 NOTE — Progress Notes (Signed)
Daily Session Note  Patient Details  Name: Karen Madden MRN: [id number] Date of Birth: 02/12/1946 Referring Provider:   Flowsheet Row Cardiac Rehab from 01/22/2021 in [facility] Cardiac and Pulmonary Rehab  Referring Provider Lujean Amel MD      Encounter Date: 04/02/2021  Check In:  Session Check In - 04/02/21 1416      Check-In   Supervising physician immediately available to respond to emergencies See telemetry face sheet for immediately available ER MD    Location [facility]-Cardiac & Pulmonary Rehab    Staff Present Birdie Sons, MPA, RN;Joseph Lou Miner, [place] Exercise Physiologist    Virtual Visit No    Medication changes reported     No    Fall or balance concerns reported    No    Warm-up and Cool-down Performed on first and last piece of equipment    Resistance Training Performed Yes    VAD Patient? No    PAD/SET Patient? No      Pain Assessment   Currently in Pain? No/denies              Social History   Tobacco Use  Smoking Status Never Smoker  Smokeless Tobacco Never Used    Goals Met:  Independence with exercise equipment Exercise tolerated well No report of cardiac concerns or symptoms Strength training completed today  Goals Unmet:  Not Applicable  Comments:  Jamilet graduated today from  rehab with 36 sessions completed.  Details of the patient's exercise prescription and what She needs to do in order to continue the prescription and progress were discussed with patient.  Patient was given a copy of prescription and goals.  Patient verbalized understanding.  [name] plans to continue to exercise by walking and using staff videos.   Dr. Emily Filbert is Medical Director for [place] and [facility].

## 2021-04-02 NOTE — Progress Notes (Signed)
Cardiac Individual Treatment Plan  Patient Details  Name: Karen Madden MRN: [id number] Date of Birth: Feb 20, 1946 Referring Provider:   Flowsheet Row Cardiac Rehab from 01/22/2021 in [facility] Cardiac and Pulmonary Rehab  Referring Provider Lujean Amel MD      Initial Encounter Date:  Flowsheet Row Cardiac Rehab from 01/22/2021 in [facility] Cardiac and Pulmonary Rehab  Date 01/22/21      Visit Diagnosis: ST elevation myocardial infarction (STEMI), unspecified artery [facility])  Status post coronary artery stent placement  Patient's Home Medications on Admission:  Current Outpatient Medications:  .  aspirin 81 MG chewable tablet, Chew by mouth daily., Disp: , Rfl:  .  atorvastatin (LIPITOR) 80 MG tablet, Take 1 tablet (80 mg total) by mouth daily., Disp: 30 tablet, Rfl: 1 .  buPROPion (WELLBUTRIN XL) 150 MG 24 hr tablet, Take 150 mg by mouth daily., Disp: , Rfl:  .  buPROPion (ZYBAN) 150 MG 12 hr tablet, Take 150 mg by mouth 2 (two) times daily., Disp: , Rfl:  .  cholecalciferol (VITAMIN D3) 10 MCG (400 UNIT) TABS tablet, Take 1,000 Units by mouth daily., Disp: , Rfl:  .  diphenhydrAMINE (SIMPLY SLEEP) 25 MG tablet, Take 25 mg by mouth at bedtime as needed for sleep., Disp: , Rfl:  .  ezetimibe (ZETIA) 10 MG tablet, Take 10 mg by mouth daily., Disp: , Rfl:  .  famotidine (PEPCID) 10 MG tablet, Take 10 mg by mouth 2 (two) times daily., Disp: , Rfl:  .  furosemide (LASIX) 40 MG tablet, Take 1 tablet (40 mg total) by mouth daily. (Patient taking differently: Take 20 mg by mouth daily.), Disp: 30 tablet, Rfl: 0 .  gabapentin (NEURONTIN) 400 MG capsule, Take 400 mg by mouth 3 (three) times daily. Pt takes 400 mg once daily, Disp: , Rfl:  .  levothyroxine (SYNTHROID) 88 MCG tablet, Take 88 mcg by mouth daily before breakfast., Disp: , Rfl:  .  loperamide (IMODIUM A-D) 2 MG tablet, Take 2 mg by mouth 4 (four) times daily as needed for diarrhea or loose stools., Disp: , Rfl:  .  metoprolol succinate  (TOPROL-XL) 25 MG 24 hr tablet, Take 0.5 tablets (12.5 mg total) by mouth daily. (Patient taking differently: Take 25 mg by mouth daily.), Disp: 15 tablet, Rfl: 0 .  Probiotic Product (PROBIOTIC ADVANCED PO), Take by mouth daily., Disp: , Rfl:  .  spironolactone (ALDACTONE) 25 MG tablet, Take 1 tablet (25 mg total) by mouth daily., Disp: 30 tablet, Rfl: 0 .  triamcinolone (KENALOG) 0.025 % cream, Apply 1 application topically 2 (two) times daily., Disp: , Rfl:   Past Medical History: Past Medical History:  Diagnosis Date  . CHF (congestive heart failure) ([facility])   . Coronary artery disease   . Hypertension     Tobacco Use: Social History   Tobacco Use  Smoking Status Never Smoker  Smokeless Tobacco Never Used    Labs: Recent Review Scientist, physiological    Labs for ITP Cardiac and Pulmonary Rehab Latest Ref Rng & Units 12/22/2020   Cholestrol 0 - 200 mg/dL 191   LDLCALC 0 - 99 mg/dL 92   HDL >40 mg/dL 70   Trlycerides <150 mg/dL 144   Hemoglobin A1c 4.8 - 5.6 % 5.7(H)       Exercise Target Goals: Exercise Program Goal: Individual exercise prescription set using results from initial 6 min walk test and THRR while considering  patient's activity barriers and safety.   Exercise Prescription Goal: Initial exercise prescription  builds to 30-45 minutes a day of aerobic activity, 2-3 days per week.  Home exercise guidelines will be given to patient during program as part of exercise prescription that the participant will acknowledge.   Education: Aerobic Exercise: - Group verbal and visual presentation on the components of exercise prescription. Introduces F.I.T.T principle from ACSM for exercise prescriptions.  Reviews F.I.T.T. principles of aerobic exercise including progression. Written material given at graduation. Flowsheet Row Cardiac Rehab from 04/02/2021 in [facility] Cardiac and Pulmonary Rehab  Date 02/05/21  Educator jh  Instruction Review Code 1- Verbalizes Understanding       Education: Resistance Exercise: - Group verbal and visual presentation on the components of exercise prescription. Introduces F.I.T.T principle from ACSM for exercise prescriptions  Reviews F.I.T.T. principles of resistance exercise including progression. Written material given at graduation. Flowsheet Row Cardiac Rehab from 04/02/2021 in [facility] Cardiac and Pulmonary Rehab  Date 02/12/21  Educator AS  Instruction Review Code 1- Verbalizes Understanding       Education: Exercise & Equipment Safety: - Individual verbal instruction and demonstration of equipment use and safety with use of the equipment. Flowsheet Row Cardiac Rehab from 04/02/2021 in [facility] Cardiac and Pulmonary Rehab  Education need identified 01/22/21  Date 01/22/21  Educator [place]  Instruction Review Code 1- Verbalizes Understanding      Education: Exercise Physiology & General Exercise Guidelines: - Group verbal and written instruction with models to review the exercise physiology of the cardiovascular system and associated critical values. Provides general exercise guidelines with specific guidelines to those with heart or lung disease.  Flowsheet Row Cardiac Rehab from 04/02/2021 in [facility] Cardiac and Pulmonary Rehab  Date 04/02/21  Educator AS  Instruction Review Code 1- Verbalizes Understanding      Education: Flexibility, Balance, Mind/Body Relaxation: - Group verbal and visual presentation with interactive activity on the components of exercise prescription. Introduces F.I.T.T principle from ACSM for exercise prescriptions. Reviews F.I.T.T. principles of flexibility and balance exercise training including progression. Also discusses the mind body connection.  Reviews various relaxation techniques to help reduce and manage stress (i.e. Deep breathing, progressive muscle relaxation, and visualization). Balance handout provided to take home. Written material given at graduation. Flowsheet Row Cardiac Rehab from 04/02/2021  in [facility] Cardiac and Pulmonary Rehab  Date 02/19/21  Educator AS  Instruction Review Code 1- Verbalizes Understanding      Activity Barriers & Risk Stratification:  Activity Barriers & Cardiac Risk Stratification - 01/22/21 1305      Activity Barriers & Cardiac Risk Stratification   Activity Barriers Back Problems;Balance Concerns;Other (comment)    Comments Back problems related to hx of colon CA (missing part of colon); has completed PT for balance issues- dizzy at times due to medications    Cardiac Risk Stratification High           6 Minute Walk:  [place] Name 01/22/21 1257 03/26/21 1439       6 Minute Walk   Phase Initial Discharge    Distance 915 feet 1310 feet    Distance % Change -- 43.2 %    Distance Feet Change -- 395 ft    Walk Time 5 minutes  break 2:49-3:45 6 minutes    # of Rest Breaks 1 0    MPH 2.07 2.48    METS 1.7 2.76    RPE 11 14    Perceived Dyspnea  1 2    VO2 Peak 5.95 9.67    Symptoms  Yes (comment) Yes (comment)    Comments Legs fatigued chronic back pain 4/10    Resting HR 70 bpm 55 bpm    Resting BP 100/64 112/56    Resting Oxygen Saturation  96 % --    Exercise Oxygen Saturation  during 6 min walk 99 % --    Max Ex. HR 87 bpm 107 bpm    Max Ex. BP 116/64 142/74    2 Minute Post BP 98/62 --           Oxygen Initial Assessment:   Oxygen Re-Evaluation:   Oxygen Discharge (Final Oxygen Re-Evaluation):   Initial Exercise Prescription:  Initial Exercise Prescription - 01/22/21 1200      Date of Initial Exercise RX and Referring Provider   Date 01/22/21    Referring Provider Lujean Amel MD      Recumbant Bike   Level 1    RPM 60    Watts 10    Minutes 15    METs 1.7      NuStep   Level 1    SPM 80    Minutes 15    METs 1.7      T5 Nustep   Level 1    SPM 80    Minutes 15    METs 1.7      Prescription Details   Frequency (times per week) 3    Duration Progress to 30 minutes of continuous  aerobic without signs/symptoms of physical distress      Intensity   THRR 40-80% of Max Heartrate 100-130    Ratings of Perceived Exertion 11-13    Perceived Dyspnea 0-4      Progression   Progression Continue to progress workloads to maintain intensity without signs/symptoms of physical distress.      Resistance Training   Training Prescription Yes    Weight 3 lb    Reps 10-15           Perform Capillary Blood Glucose checks as needed.  Exercise Prescription Changes:  Exercise Prescription Changes    Row Name 01/22/21 1300 01/27/21 0900 02/11/21 1200 02/12/21 1400 02/26/21 0900     Response to Exercise   Blood Pressure (Admit) 100/64 108/64 102/62 -- 110/60   Blood Pressure (Exercise) 116/64 124/62 126/80 -- 136/60   Blood Pressure (Exit) 98/62 118/66 98/58 -- 92/60   Heart Rate (Admit) 70 bpm 68 bpm 70 bpm -- 87 bpm   Heart Rate (Exercise) 87 bpm 79 bpm 86 bpm -- 96 bpm   Heart Rate (Exit) 67 bpm 66 bpm 76 bpm -- 83 bpm   Oxygen Saturation (Admit) 96 % -- -- -- --   Oxygen Saturation (Exercise) 99 % -- -- -- --   Oxygen Saturation (Exit) 98 % -- -- -- --   Rating of Perceived Exertion (Exercise) 11 13 12  -- 15   Perceived Dyspnea (Exercise) 1 -- -- -- --   Symptoms legs fatigued fatigue none -- none   Comments walk test results first full day of exericse -- -- --   Duration -- Progress to 30 minutes of  aerobic without signs/symptoms of physical distress Progress to 30 minutes of  aerobic without signs/symptoms of physical distress -- Continue with 30 min of aerobic exercise without signs/symptoms of physical distress.   Intensity -- THRR unchanged THRR unchanged -- THRR unchanged     Progression   Progression -- Continue to progress workloads to maintain intensity without signs/symptoms of physical distress. Continue to progress  workloads to maintain intensity without signs/symptoms of physical distress. -- Continue to progress workloads to maintain intensity without  signs/symptoms of physical distress.   Average METs -- 1.7 2.6 -- 2.47     Resistance Training   Training Prescription Yes Yes Yes -- Yes   Weight 3 lb 3 lb 3 lb -- 3 lb   Reps 10-15 10-15 10-15 -- 10-15     Interval Training   Interval Training -- No No -- No     Treadmill   MPH -- -- -- -- 1.2   Grade -- -- -- -- 0   Minutes -- -- -- -- 15   METs -- -- -- -- 1.9     Recumbant Bike   Level 1 -- 2 -- 2   RPM 60 -- 60 -- --   Watts -- -- -- -- 19   Minutes 15 -- 15 -- 15   METs 1.7 -- 2.7 -- 2.66     NuStep   Level 1 -- 3 -- 3   SPM 80 -- 80 -- --   Minutes 15 -- 15 -- 15   METs 1.7 -- 2.5 -- 2.9     T5 Nustep   Level 1 1 -- -- 3   SPM 80 -- -- -- --   Minutes 15 30 -- -- 15   METs 1.7 1.8 -- -- 2     Home Exercise Plan   Plans to continue exercise at -- -- -- Home (comment)  walk, YouTube videos Home (comment)  walk, YouTube videos   Frequency -- -- -- Add 2 additional days to program exercise sessions. Add 2 additional days to program exercise sessions.   Initial Home Exercises Provided -- -- -- 02/12/21 02/12/21   Row Name 03/11/21 1200 03/24/21 1400 03/25/21 1600         Response to Exercise   Blood Pressure (Admit) 124/66 -- 122/60     Blood Pressure (Exercise) 110/62 -- 124/62     Blood Pressure (Exit) 112/60 -- 104/64     Heart Rate (Admit) 80 bpm -- 74 bpm     Heart Rate (Exercise) 91 bpm -- 109 bpm     Heart Rate (Exit) 73 bpm -- 88 bpm     Rating of Perceived Exertion (Exercise) 13 -- 14     Symptoms none -- none     Duration Continue with 30 min of aerobic exercise without signs/symptoms of physical distress. -- Continue with 30 min of aerobic exercise without signs/symptoms of physical distress.     Intensity THRR unchanged -- THRR unchanged           Progression   Progression Continue to progress workloads to maintain intensity without signs/symptoms of physical distress. -- Continue to progress workloads to maintain intensity without  signs/symptoms of physical distress.     Average METs 2.9 -- 2.67           Resistance Training   Training Prescription Yes -- Yes     Weight 3 lb -- 3 lb     Reps 10-15 -- 10-15           Interval Training   Interval Training No -- No           Treadmill   MPH -- -- 2     Grade -- -- 0     Minutes -- -- 15     METs -- -- 2.53  Recumbant Bike   Level 2 -- 2     Minutes 15 -- 15     METs 2.6 -- 2.7           NuStep   Level 3 -- 4     Minutes 15 -- 15     METs 3.2 -- 3.5           T5 Nustep   Level -- -- 3     Minutes -- -- 15     METs -- -- 1.9           Home Exercise Plan   Plans to continue exercise at -- Home (comment)  walk, YouTube videos Home (comment)  walk, YouTube videos     Frequency -- Add 2 additional days to program exercise sessions. Add 2 additional days to program exercise sessions.     Initial Home Exercises Provided -- 02/12/21 02/12/21            Exercise Comments:   Exercise Goals and Review:  Exercise Goals    Row Name 01/22/21 1305             Exercise Goals   Increase Physical Activity Yes       Intervention Provide advice, education, support and counseling about physical activity/exercise needs.;Develop an individualized exercise prescription for aerobic and resistive training based on initial evaluation findings, risk stratification, comorbidities and participant's personal goals.       Expected Outcomes Short Term: Attend rehab on a regular basis to increase amount of physical activity.;Long Term: Add in home exercise to make exercise part of routine and to increase amount of physical activity.;Long Term: Exercising regularly at least 3-5 days a week.       Increase Strength and Stamina Yes       Intervention Provide advice, education, support and counseling about physical activity/exercise needs.;Develop an individualized exercise prescription for aerobic and resistive training based on initial evaluation findings,  risk stratification, comorbidities and participant's personal goals.       Expected Outcomes Short Term: Increase workloads from initial exercise prescription for resistance, speed, and METs.;Short Term: Perform resistance training exercises routinely during rehab and add in resistance training at home;Long Term: Improve cardiorespiratory fitness, muscular endurance and strength as measured by increased METs and functional capacity (6MWT)       Able to understand and use rate of perceived exertion (RPE) scale Yes       Intervention Provide education and explanation on how to use RPE scale       Expected Outcomes Short Term: Able to use RPE daily in rehab to express subjective intensity level;Long Term:  Able to use RPE to guide intensity level when exercising independently       Able to understand and use Dyspnea scale Yes       Intervention Provide education and explanation on how to use Dyspnea scale       Expected Outcomes Short Term: Able to use Dyspnea scale daily in rehab to express subjective sense of shortness of breath during exertion;Long Term: Able to use Dyspnea scale to guide intensity level when exercising independently       Knowledge and understanding of Target Heart Rate Range (THRR) Yes       Intervention Provide education and explanation of THRR including how the numbers were predicted and where they are located for reference       Expected Outcomes Short Term: Able to state/look up THRR;Short Term: Able to use daily  as guideline for intensity in rehab;Long Term: Able to use THRR to govern intensity when exercising independently       Able to check pulse independently Yes       Intervention Provide education and demonstration on how to check pulse in carotid and radial arteries.;Review the importance of being able to check your own pulse for safety during independent exercise       Expected Outcomes Short Term: Able to explain why pulse checking is important during independent  exercise;Long Term: Able to check pulse independently and accurately       Understanding of Exercise Prescription Yes       Intervention Provide education, explanation, and written materials on patient's individual exercise prescription       Expected Outcomes Short Term: Able to explain program exercise prescription;Long Term: Able to explain home exercise prescription to exercise independently              Exercise Goals Re-Evaluation :  Exercise Goals Re-Evaluation    Row Name 01/23/21 1355 02/11/21 1245 02/12/21 1433 02/20/21 1428 02/26/21 0925     Exercise Goal Re-Evaluation   Exercise Goals Review Increase Physical Activity;Able to understand and use rate of perceived exertion (RPE) scale;Knowledge and understanding of Target Heart Rate Range (THRR);Understanding of Exercise Prescription;Increase Strength and Stamina;Able to check pulse independently Increase Physical Activity;Increase Strength and Stamina Increase Physical Activity;Increase Strength and Stamina;Able to understand and use rate of perceived exertion (RPE) scale;Able to understand and use Dyspnea scale;Knowledge and understanding of Target Heart Rate Range (THRR);Able to check pulse independently;Understanding of Exercise Prescription Increase Physical Activity;Increase Strength and Stamina;Understanding of Exercise Prescription Increase Physical Activity;Increase Strength and Stamina;Understanding of Exercise Prescription   Comments Reviewed RPE and dyspnea scales, THR and program prescription with pt today.  Pt voiced understanding and was given a copy of goals to take home. Karen Madden is progressing well with exercise.  She has increase to level 2 on RB and 3 on T4. Reviewed home exercise with pt today.  Pt plans to walk and use YouTube videos at home for exercise.  Reviewed THR, pulse, RPE, sign and symptoms, pulse oximetery and when to call 911 or MD.  Also discussed weather considerations and indoor options.  Pt voiced  understanding. Karen Madden is doing well in rehab.  She walked on the treadmill today for 5 min.  She can tell that it is making a difference.  Today, she was able to walk down here faster than normal.  She has not really started to add in her home exercise in yet.  She has been walking around some in the grocery store. Karen Madden continues to do well in rehab.  She is on level 3 for the NuStep.  We will continue to monitor her progress.   Expected Outcomes Short: Use RPE daily to regulate intensity. Long: Follow program prescription in THR. Short:  attend consistently Long: improve overall stamina Short: Start to add in exercise videos on off days and find 2lb weights Long: Continue to exercise independently Short: Add in more exercise at home Long: Continue to improve stamina. Short: Contnue to move up workloads Long: Continue to improve stamina.   [place] Name 03/11/21 1234 03/24/21 1428           Exercise Goal Re-Evaluation   Exercise Goals Review Increase Physical Activity;Increase Strength and Stamina Increase Physical Activity;Increase Strength and Stamina;Understanding of Exercise Prescription      Comments Karen Madden attends consistently.  She doesnt quite reach target HR range of  110-130.  Staff will encourage her to increase levels to reach HR range. Karen Madden is doing well in rehab.  She has great attendance and does well in class.  She has not done too much at home. She is finding herself getting too busy.  She has gotten the weights and knows that she needs to get into the habit.  She is trying to get there. She has noticed that it does help her feel better.      Expected Outcomes Short: reach THR range Long: improve stamina Short: Start to exercise at home Long: Continue to improve stamina.             Discharge Exercise Prescription (Final Exercise Prescription Changes):  Exercise Prescription Changes - 03/25/21 1600      Response to Exercise   Blood Pressure (Admit) 122/60    Blood Pressure  (Exercise) 124/62    Blood Pressure (Exit) 104/64    Heart Rate (Admit) 74 bpm    Heart Rate (Exercise) 109 bpm    Heart Rate (Exit) 88 bpm    Rating of Perceived Exertion (Exercise) 14    Symptoms none    Duration Continue with 30 min of aerobic exercise without signs/symptoms of physical distress.    Intensity THRR unchanged      Progression   Progression Continue to progress workloads to maintain intensity without signs/symptoms of physical distress.    Average METs 2.67      Resistance Training   Training Prescription Yes    Weight 3 lb    Reps 10-15      Interval Training   Interval Training No      Treadmill   MPH 2    Grade 0    Minutes 15    METs 2.53      Recumbant Bike   Level 2    Minutes 15    METs 2.7      NuStep   Level 4    Minutes 15    METs 3.5      T5 Nustep   Level 3    Minutes 15    METs 1.9      Home Exercise Plan   Plans to continue exercise at Home (comment)   walk, YouTube videos   Frequency Add 2 additional days to program exercise sessions.    Initial Home Exercises Provided 02/12/21           Nutrition:  Target Goals: Understanding of nutrition guidelines, daily intake of sodium 1500mg , cholesterol 200mg , calories 30% from fat and 7% or less from saturated fats, daily to have 5 or more servings of fruits and vegetables.  Education: All About Nutrition: -Group instruction provided by verbal, written material, interactive activities, discussions, models, and posters to present general guidelines for heart healthy nutrition including fat, fiber, MyPlate, the role of sodium in heart healthy nutrition, utilization of the nutrition label, and utilization of this knowledge for meal planning. Follow up email sent as well. Written material given at graduation. Flowsheet Row Cardiac Rehab from 04/02/2021 in [facility] Cardiac and Pulmonary Rehab  Date 02/26/21  Educator [facility]  Instruction Review Code 1- Verbalizes Understanding       Biometrics:  Pre Biometrics - 01/22/21 1307      Pre Biometrics   Height 5' 4.5" (1.638 m)    Weight 160 lb 11.2 oz (72.9 kg)    BMI (Calculated) 27.17    Single Leg Stand 9.3 seconds  Post Biometrics - 03/26/21 1441       Post  Biometrics   Height 5' 4.5" (1.638 m)    Weight 161 lb 3.2 oz (73.1 kg)    BMI (Calculated) 27.25           Nutrition Therapy Plan and Nutrition Goals:  Nutrition Therapy & Goals - 02/24/21 1504      Nutrition Therapy   Diet Heart healthy, low Na    Drug/Food Interactions Statins/Certain Fruits    Protein (specify units) 60g    Fiber 25 grams    Whole Grain Foods 3 servings    Saturated Fats 12 max. grams    Fruits and Vegetables 8 servings/day    Sodium 1.5 grams      Personal Nutrition Goals   Nutrition Goal ST: calculate sodium using food labels to see what largest source is and if chnages are needed LT: limit sodium to 1500mg  or less per day    Comments B: banana and sometimes piece of toast with butter and peach preserves and low sodium cranberry muffins or great grains with almond milk L: half of sandwich with low salt pimento cheese or PB, cottage cheese and fruit D: chicken or [place], low Na butter on potato and oil and vinegar salad. She does not eat red meat - only chicken, [place], and sometimes fish.  Drinks: water, 1 cup of coffee in am with half and half, ginger tea and honey in evening. She doesn't have a big appetite since heart attack. She has had colon cancer and had a resection of bowel which affects her. She takes amodium to help with that. Sweet potatoes affect her as does tomato sauce. Discussed heart healthy eating.      Intervention Plan   Intervention Prescribe, educate and counsel regarding individualized specific dietary modifications aiming towards targeted core components such as weight, hypertension, lipid management, diabetes, heart failure and other comorbidities.;Nutrition handout(s) given to patient.     Expected Outcomes Short Term Goal: Understand basic principles of dietary content, such as calories, fat, sodium, cholesterol and nutrients.;Short Term Goal: A plan has been developed with personal nutrition goals set during dietitian appointment.;Long Term Goal: Adherence to prescribed nutrition plan.           Nutrition Assessments:  MEDIFICTS Score Key:  ?70 Need to make dietary changes   40-70 Heart Healthy Diet  ? 40 Therapeutic Level Cholesterol Diet  Flowsheet Row Cardiac Rehab from 03/31/2021 in [facility] Cardiac and Pulmonary Rehab  Picture Your Plate Total Score on Admission 72  Picture Your Plate Total Score on Discharge 74     Picture Your Plate Scores:  <02 Unhealthy dietary pattern with much room for improvement.  41-50 Dietary pattern unlikely to meet recommendations for good health and room for improvement.  51-60 More healthful dietary pattern, with some room for improvement.   >60 Healthy dietary pattern, although there may be some specific behaviors that could be improved.    Nutrition Goals Re-Evaluation:  Nutrition Goals Re-Evaluation    [place] Name 02/20/21 1433 03/24/21 1434           Goals   Nutrition Goal Meet with dietician. ST: calculate sodium using food labels to see what largest source is and if chnages are needed LT: limit sodium to 1500mg  or less per day      Comment Karen Madden missed her appt earlier this week but got rescheduled for next week after class! Benny is doing well with her diet.  She is  trying to watch her diet more closely.  She is now reading labels and more aware of staying away from the sodium.  She has cheat days, but overall doing well.      Expected Outcome Meet with dietician. Short: Continue to monitor sodium intake Long; Continue to eat heart healthy.             Nutrition Goals Discharge (Final Nutrition Goals Re-Evaluation):  Nutrition Goals Re-Evaluation - 03/24/21 1434      Goals   Nutrition Goal ST: calculate sodium  using food labels to see what largest source is and if chnages are needed LT: limit sodium to 1500mg  or less per day    Comment Karen Madden is doing well with her diet.  She is trying to watch her diet more closely.  She is now reading labels and more aware of staying away from the sodium.  She has cheat days, but overall doing well.    Expected Outcome Short: Continue to monitor sodium intake Long; Continue to eat heart healthy.           Psychosocial: Target Goals: Acknowledge presence or absence of significant depression and/or stress, maximize coping skills, provide positive support system. Participant is able to verbalize types and ability to use techniques and skills needed for reducing stress and depression.   Education: Stress, Anxiety, and Depression - Group verbal and visual presentation to define topics covered.  Reviews how body is impacted by stress, anxiety, and depression.  Also discusses healthy ways to reduce stress and to treat/manage anxiety and depression.  Written material given at graduation. Flowsheet Row Cardiac Rehab from 04/02/2021 in [facility] Cardiac and Pulmonary Rehab  Date 03/26/21  Educator [facility]  Instruction Review Code 1- [employer] Understanding      Education: Sleep Hygiene -Provides group verbal and written instruction about how sleep can affect your health.  Define sleep hygiene, discuss sleep cycles and impact of sleep habits. Review good sleep hygiene tips.    Initial Review & Psychosocial Screening:  Initial Psych Review & Screening - 01/15/21 1043      Initial Review   Current issues with Current Sleep Concerns;Current Stress Concerns    Source of Stress Concerns Chronic Illness      Family Dynamics   Good Support System? Yes   husband, 3 children live close     Barriers   Psychosocial barriers to participate in program There are no identifiable barriers or psychosocial needs.      Screening Interventions   Interventions Encouraged to  exercise;Provide feedback about the scores to participant;To provide support and resources with identified psychosocial needs    Expected Outcomes Short Term goal: Utilizing psychosocial counselor, staff and physician to assist with identification of specific Stressors or current issues interfering with healing process. Setting desired goal for each stressor or current issue identified.;Long Term Goal: Stressors or current issues are controlled or eliminated.;Short Term goal: Identification and review with participant of any Quality of Life or Depression concerns found by scoring the questionnaire.;Long Term goal: The participant improves quality of Life and PHQ9 Scores as seen by post scores and/or verbalization of changes           Quality of Life Scores:   Quality of Life - 03/31/21 1420      Quality of Life Scores   Health/Function Pre 19.8 %    Health/Function Post 15.13 %    Health/Function % Change -23.59 %    Socioeconomic Pre 24.38 %    Socioeconomic  Post 27 %    Socioeconomic % Change  10.75 %    Psych/Spiritual Pre 26.57 %    Psych/Spiritual Post 23.14 %    Psych/Spiritual % Change -12.91 %    Family Pre 30 %    Family Post 19.2 %    Family % Change -36 %    GLOBAL Pre 23.66 %    GLOBAL Post 20.03 %    GLOBAL % Change -15.34 %          Scores of 19 and below usually indicate a poorer quality of life in these areas.  A difference of  2-3 points is a clinically meaningful difference.  A difference of 2-3 points in the total score of the Quality of Life Index has been associated with significant improvement in overall quality of life, self-image, physical symptoms, and general health in studies assessing change in quality of life.  PHQ-9: Recent Review Flowsheet Data    Depression screen [facility] 2/9 03/31/2021 01/22/2021   Decreased Interest 2 0   Down, Depressed, Hopeless 2 0   PHQ - 2 Score 4 0   Altered sleeping 2 1   Tired, decreased energy 2 1   Change in appetite 1 0    Feeling bad or failure about yourself  0 0   Trouble concentrating 0 0   Moving slowly or fidgety/restless 0 0   Suicidal thoughts 0 0   PHQ-9 Score 9 2   Difficult doing work/chores Somewhat difficult Not difficult at all     Interpretation of Total Score  Total Score Depression Severity:  1-4 = Minimal depression, 5-9 = Mild depression, 10-14 = Moderate depression, 15-19 = Moderately severe depression, 20-27 = Severe depression   Psychosocial Evaluation and Intervention:  Psychosocial Evaluation - 01/15/21 1100      Psychosocial Evaluation & Interventions   Comments 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 She feels like she is over Covid and didn't even know she had it at the time. She still has some minor chest discomfort but nothing her MD is worried about. She is on Wellbutrin and reports it is still working. She states her husband and 3 local children are very supportive, one of her daughters comes over and cooks low sodium meals as well. Her sleep pattern hasnt changed after her MI and is still requiring some medication to help her stay asleep. Her balance is still an issue (she's completed PT in the past) and hopes to work on it while she is here. She does have some back pain related to history of colon cancer and bowel surgery. She is motivated to start cardiac rehab to help with strength and stamina    Expected Outcomes Short: attend cardiac rehab for education and exercise. Long: develop and maintain positive self care habits.    Continue Psychosocial Services  Follow up required by staff           Psychosocial Re-Evaluation:  Psychosocial Re-Evaluation    [place] Name 02/20/21 1431 03/24/21 1432           Psychosocial Re-Evaluation   Current issues with None Identified Current Stress Concerns;History of Depression      Comments Karen Madden is doing well in rehab.  She has been able to go back to [employer] where she had her heart attack and felt pretty good going  again.  She has a lot of support from her family, both her kids and her husband any time, any where.  She does not have any major stressors going on currently.  She is sleeping pretty well most nights. Karen Madden is doing well in rehab.  She still has her down days where she feels depressed and overwhelmed. She has fewer days than when she started. It still comes as a shock to her, but it is getting better.  She is still sleeping good.      Expected Outcomes Short: continue to lean on family for support Long: Continue to focus on the positive. Short: Continue to get support from family on down days Long: Continue to focus on positive      Interventions Encouraged to attend Cardiac Rehabilitation for the exercise Encouraged to attend Cardiac Rehabilitation for the exercise      Continue Psychosocial Services  Follow up required by staff Follow up required by staff             Psychosocial Discharge (Final Psychosocial Re-Evaluation):  Psychosocial Re-Evaluation - 03/24/21 1432      Psychosocial Re-Evaluation   Current issues with Current Stress Concerns;History of Depression    Comments Karen Madden is doing well in rehab.  She still has her down days where she feels depressed and overwhelmed. She has fewer days than when she started. It still comes as a shock to her, but it is getting better.  She is still sleeping good.    Expected Outcomes Short: Continue to get support from family on down days Long: Continue to focus on positive    Interventions Encouraged to attend Cardiac Rehabilitation for the exercise    Continue Psychosocial Services  Follow up required by staff           Vocational Rehabilitation: Provide vocational rehab assistance to qualifying candidates.   Vocational Rehab Evaluation & Intervention:  Vocational Rehab - 01/15/21 1043      Initial Vocational Rehab Evaluation & Intervention   Assessment shows need for Vocational Rehabilitation No           Education: Education  Goals: Education classes will be provided on a variety of topics geared toward better understanding of heart health and risk factor modification. Participant will state understanding/return demonstration of topics presented as noted by education test scores.  Learning Barriers/Preferences:  Learning Barriers/Preferences - 01/15/21 1043      Learning Barriers/Preferences   Learning Barriers None    Learning Preferences None           General Cardiac Education Topics:  AED/CPR: - Group verbal and written instruction with the use of models to demonstrate the basic use of the AED with the basic ABC's of resuscitation.   Anatomy and Cardiac Procedures: - Group verbal and visual presentation and models provide information about basic cardiac anatomy and function. Reviews the testing methods done to diagnose heart disease and the outcomes of the test results. Describes the treatment choices: Medical Management, Angioplasty, or Coronary Bypass Surgery for treating various heart conditions including Myocardial Infarction, Angina, Valve Disease, and Cardiac Arrhythmias.  Written material given at graduation. Flowsheet Row Cardiac Rehab from 04/02/2021 in [facility] Cardiac and Pulmonary Rehab  Date 02/12/21  Educator [facility]  Instruction Review Code 1- Verbalizes Understanding      Medication Safety: - Group verbal and visual instruction to review commonly prescribed medications for heart and lung disease. Reviews the medication, class of the drug, and side effects. Includes the steps to properly store meds and maintain the prescription regimen.  Written material given at graduation. Flowsheet Row Cardiac Rehab from 04/02/2021 in [facility]  Cardiac and Pulmonary Rehab  Date 03/05/21  Educator [facility]  Instruction Review Code 1- Verbalizes Understanding      Intimacy: - Group verbal instruction through game format to discuss how heart and lung disease can affect sexual intimacy. Written material given at  graduation.. Flowsheet Row Cardiac Rehab from 04/02/2021 in [facility] Cardiac and Pulmonary Rehab  Date 02/05/21  Educator jh  Instruction Review Code 1- Verbalizes Understanding      Know Your Numbers and Heart Failure: - Group verbal and visual instruction to discuss disease risk factors for cardiac and pulmonary disease and treatment options.  Reviews associated critical values for Overweight/Obesity, Hypertension, Cholesterol, and Diabetes.  Discusses basics of heart failure: signs/symptoms and treatments.  Introduces Heart Failure Zone chart for action plan for heart failure.  Written material given at graduation. Flowsheet Row Cardiac Rehab from 04/02/2021 in [facility] Cardiac and Pulmonary Rehab  Date 03/12/21  Educator SB  Instruction Review Code 1- Verbalizes Understanding      Infection Prevention: - Provides verbal and written material to individual with discussion of infection control including proper hand washing and proper equipment cleaning during exercise session. Flowsheet Row Cardiac Rehab from 04/02/2021 in [facility] Cardiac and Pulmonary Rehab  Education need identified 01/22/21  Date 01/22/21  Educator [place]  Instruction Review Code 1- Verbalizes Understanding      Falls Prevention: - Provides verbal and written material to individual with discussion of falls prevention and safety. Flowsheet Row Cardiac Rehab from 04/02/2021 in [facility] Cardiac and Pulmonary Rehab  Education need identified 01/22/21  Date 01/22/21  Educator [place]  Instruction Review Code 1- Verbalizes Understanding      Other: -Provides group and verbal instruction on various topics (see comments)   Knowledge Questionnaire Score:  Knowledge Questionnaire Score - 03/31/21 1420      Knowledge Questionnaire Score   Pre Score 21/26: Angina, A&P, exercise, nutrition    Post Score 23/26           Core Components/Risk Factors/Patient Goals at Admission:  Personal Goals and Risk Factors at Admission - 01/22/21  1308      Core Components/Risk Factors/Patient Goals on Admission    Weight Management Yes;Weight Loss    Intervention Weight Management: Develop a combined nutrition and exercise program designed to reach desired caloric intake, while maintaining appropriate intake of nutrient and fiber, sodium and fats, and appropriate energy expenditure required for the weight goal.;Weight Management: Provide education and appropriate resources to help participant work on and attain dietary goals.;Weight Management/Obesity: Establish reasonable short term and long term weight goals.    Admit Weight 160 lb (72.6 kg)    Goal Weight: Short Term 157 lb (71.2 kg)    Goal Weight: Long Term 153 lb (69.4 kg)    Expected Outcomes Short Term: Continue to assess and modify interventions until short term weight is achieved;Long Term: Adherence to nutrition and physical activity/exercise program aimed toward attainment of established weight goal;Weight Loss: Understanding of general recommendations for a balanced deficit meal plan, which promotes 1-2 lb weight loss per week and includes a negative energy balance of [phone] kcal/d;Understanding recommendations for meals to include 15-35% energy as protein, 25-35% energy from fat, 35-60% energy from carbohydrates, less than 200mg  of dietary cholesterol, 20-35 gm of total fiber daily;Understanding of distribution of calorie intake throughout the day with the consumption of 4-5 meals/snacks    Heart Failure Yes    Intervention Provide a combined exercise and nutrition program that is supplemented with education, support and counseling  about heart failure. Directed toward relieving symptoms such as shortness of breath, decreased exercise tolerance, and extremity edema.    Expected Outcomes Improve functional capacity of life;Short term: Attendance in program 2-3 days a week with increased exercise capacity. Reported lower sodium intake. Reported increased fruit and vegetable intake.  Reports medication compliance.;Short term: Daily weights obtained and reported for increase. Utilizing diuretic protocols set by physician.;Long term: Adoption of self-care skills and reduction of barriers for early signs and symptoms recognition and intervention leading to self-care maintenance.    Hypertension Yes    Intervention Provide education on lifestyle modifcations including regular physical activity/exercise, weight management, moderate sodium restriction and increased consumption of fresh fruit, vegetables, and low fat dairy, alcohol moderation, and smoking cessation.;Monitor prescription use compliance.    Expected Outcomes Short Term: Continued assessment and intervention until BP is < 140/15mm HG in hypertensive participants. < 130/29mm HG in hypertensive participants with diabetes, heart failure or chronic kidney disease.;Long Term: Maintenance of blood pressure at goal levels.    Lipids Yes    Intervention Provide education and support for participant on nutrition & aerobic/resistive exercise along with prescribed medications to achieve LDL 70mg , HDL >40mg .    Expected Outcomes Short Term: Participant states understanding of desired cholesterol values and is compliant with medications prescribed. Participant is following exercise prescription and nutrition guidelines.;Long Term: Cholesterol controlled with medications as prescribed, with individualized exercise RX and with personalized nutrition plan. Value goals: LDL < 70mg , HDL > 40 mg.           Education:Diabetes - Individual verbal and written instruction to review signs/symptoms of diabetes, desired ranges of glucose level fasting, after meals and with exercise. Acknowledge that pre and post exercise glucose checks will be done for 3 sessions at entry of program.   Core Components/Risk Factors/Patient Goals Review:   Goals and Risk Factor Review    Row Name 02/20/21 1434 03/24/21 1435           Core Components/Risk  Factors/Patient Goals Review   Personal Goals Review Weight Management/Obesity;Heart Failure;Hypertension Weight Management/Obesity;Heart Failure;Hypertension      Review Karen Madden is doing well in rehab.  Her weight is coming down. She is down to 157 now which down almost 10 lb since her event.  She denies any real symptoms of heart failure.  She only notices the SOB when she is really exerting herself.  She is able to do more aroud the house currently.  She is doing well with watching her sodium intake.  She is reading all the food labels.  Her daughter helps her by cooking meals and them limit their salt overall.  Her pressures have been doing really well. She says they are the lowest they have been since prior to her hysterectomy.  She is feeling pretty good overall with her progress. Karen Madden is doing well in rehab. Her weight continues to fluctuate.  She went to a birthday party this weekend so it was up today hear.  We talked about adding in exercise to help.  She denies any heart failure symptoms and feeling pretty good from that side of things.  Her pressures continue to do well!!  She is feeling bettter overall.      Expected Outcomes Short: Continue to work on weight loss Long: Continue to monitor risk factors. Short: Continue to work on weight loss Long: Continue to monitor risk factors.             Core Components/Risk Factors/Patient Goals at Discharge (  Final Review):   Goals and Risk Factor Review - 03/24/21 1435      Core Components/Risk Factors/Patient Goals Review   Personal Goals Review Weight Management/Obesity;Heart Failure;Hypertension    Review Karen Madden is doing well in rehab. Her weight continues to fluctuate.  She went to a birthday party this weekend so it was up today hear.  We talked about adding in exercise to help.  She denies any heart failure symptoms and feeling pretty good from that side of things.  Her pressures continue to do well!!  She is feeling bettter overall.     Expected Outcomes Short: Continue to work on weight loss Long: Continue to monitor risk factors.           ITP Comments:  ITP Comments    Row Name 01/15/21 1034 01/22/21 1242 01/23/21 1355 02/12/21 0703 03/12/21 1010   ITP Comments Initial telephone orientation completed. Diagnosis can be found in CHL 1/2. EP orientation scheduled for 2/2 at 11am. Completed 6MWT and gym orientation. Initial ITP created and sent for review to Dr. Emily Filbert, Medical Director. First full day of exercise!  Patient was oriented to gym and equipment including functions, settings, policies, and procedures.  Patient's individual exercise prescription and treatment plan were reviewed.  All starting workloads were established based on the results of the 6 minute walk test done at initial orientation visit.  The plan for exercise progression was also introduced and progression will be customized based on patient's performance and goals. 30 Day review completed. Medical Director ITP review done, changes made as directed, and signed approval by Medical Director. 30 Day review completed. Medical Director ITP review done, changes made as directed, and signed approval by Medical Director.   [place] Name 04/02/21 1501           ITP Comments Karen Madden graduated today from  rehab with 36 sessions completed.  Details of the patient's exercise prescription and what She needs to do in order to continue the prescription and progress were discussed with patient.  Patient was given a copy of prescription and goals.  Patient verbalized understanding.  Marvie plans to continue to exercise by walking and using staff videos.              Comments: discharge ITP

## 2021-06-05 ENCOUNTER — Other Ambulatory Visit: Payer: Self-pay | Admitting: Student

## 2021-06-05 DIAGNOSIS — I208 Other forms of angina pectoris: Secondary | ICD-10-CM

## 2021-06-11 ENCOUNTER — Other Ambulatory Visit: Payer: Self-pay

## 2021-06-11 ENCOUNTER — Encounter
Admission: RE | Admit: 2021-06-11 | Discharge: 2021-06-11 | Disposition: A | Discharge status: Home / Self Care | Payer: Medicare Other | Source: Ambulatory Visit | Attending: Student | Admitting: Student

## 2021-06-11 DIAGNOSIS — I208 Other forms of angina pectoris: Secondary | ICD-10-CM | POA: Insufficient documentation

## 2021-06-11 LAB — NM MYOCAR MULTI W/SPECT W/WALL MOTION / EF
Estimated workload: 1 METS
Exercise duration (min): 1 min
Exercise duration (sec): 1 s
LV dias vol: 81 mL (ref 46–106)
LV sys vol: 38 mL
MPHR: 145 {beats}/min
Peak HR: 76 {beats}/min
Percent HR: 64 %
Rest HR: 65 {beats}/min
SDS: 0
SRS: 28
SSS: 24
TID: 1.03

## 2021-06-11 MED ORDER — TECHNETIUM TC 99M TETROFOSMIN IV KIT
10.0000 | PACK | Freq: Once | INTRAVENOUS | Status: AC | PRN
  Administered 2021-06-11: 09:00:00 10.1 via INTRAVENOUS

## 2021-06-11 MED ORDER — TECHNETIUM TC 99M TETROFOSMIN IV KIT
30.0000 | PACK | Freq: Once | INTRAVENOUS | Status: AC | PRN
  Administered 2021-06-11: 32.6 via INTRAVENOUS

## 2021-06-11 MED ORDER — REGADENOSON 0.4 MG/5ML IV SOLN
0.4000 mg | Freq: Once | INTRAVENOUS | Status: AC
  Administered 2021-06-11: 0.4 mg via INTRAVENOUS
  Filled 2021-06-11: qty 5

## 2021-07-03 ENCOUNTER — Other Ambulatory Visit
Admission: RE | Admit: 2021-07-03 | Discharge: 2021-07-03 | Disposition: A | Discharge status: Home / Self Care | Payer: Medicare Other | Source: Ambulatory Visit | Attending: Student | Admitting: Student

## 2021-07-03 DIAGNOSIS — Z01812 Encounter for preprocedural laboratory examination: Secondary | ICD-10-CM | POA: Insufficient documentation

## 2021-07-03 DIAGNOSIS — Z79899 Other long term (current) drug therapy: Secondary | ICD-10-CM | POA: Insufficient documentation

## 2021-07-03 LAB — BRAIN NATRIURETIC PEPTIDE: B Natriuretic Peptide: 343 pg/mL — ABNORMAL HIGH (ref 0.0–100.0)

## 2021-07-03 LAB — HM HEPATITIS C SCREENING LAB: HM Hepatitis Screen: NEGATIVE

## 2021-07-21 ENCOUNTER — Ambulatory Visit
Admission: RE | Admit: 2021-07-21 | Discharge: 2021-07-21 | Disposition: A | Discharge status: Home / Self Care | Payer: Medicare Other | Attending: Internal Medicine | Admitting: Internal Medicine

## 2021-07-21 ENCOUNTER — Encounter
Admission: RE | Disposition: A | Discharge status: Home / Self Care | Payer: Self-pay | Source: Home / Self Care | Attending: Internal Medicine

## 2021-07-21 ENCOUNTER — Encounter: Payer: Self-pay | Admitting: Internal Medicine

## 2021-07-21 ENCOUNTER — Other Ambulatory Visit: Payer: Self-pay

## 2021-07-21 DIAGNOSIS — I200 Unstable angina: Secondary | ICD-10-CM

## 2021-07-21 DIAGNOSIS — I255 Ischemic cardiomyopathy: Secondary | ICD-10-CM | POA: Diagnosis not present

## 2021-07-21 DIAGNOSIS — I25110 Atherosclerotic heart disease of native coronary artery with unstable angina pectoris: Secondary | ICD-10-CM | POA: Diagnosis not present

## 2021-07-21 DIAGNOSIS — E669 Obesity, unspecified: Secondary | ICD-10-CM | POA: Insufficient documentation

## 2021-07-21 DIAGNOSIS — Z79899 Other long term (current) drug therapy: Secondary | ICD-10-CM | POA: Insufficient documentation

## 2021-07-21 DIAGNOSIS — Z6830 Body mass index (BMI) 30.0-30.9, adult: Secondary | ICD-10-CM | POA: Diagnosis not present

## 2021-07-21 DIAGNOSIS — Z885 Allergy status to narcotic agent status: Secondary | ICD-10-CM | POA: Insufficient documentation

## 2021-07-21 DIAGNOSIS — I10 Essential (primary) hypertension: Secondary | ICD-10-CM | POA: Insufficient documentation

## 2021-07-21 DIAGNOSIS — G629 Polyneuropathy, unspecified: Secondary | ICD-10-CM | POA: Diagnosis not present

## 2021-07-21 DIAGNOSIS — E782 Mixed hyperlipidemia: Secondary | ICD-10-CM | POA: Diagnosis not present

## 2021-07-21 DIAGNOSIS — Z90710 Acquired absence of both cervix and uterus: Secondary | ICD-10-CM | POA: Insufficient documentation

## 2021-07-21 DIAGNOSIS — Z881 Allergy status to other antibiotic agents status: Secondary | ICD-10-CM | POA: Diagnosis not present

## 2021-07-21 DIAGNOSIS — Z79890 Hormone replacement therapy (postmenopausal): Secondary | ICD-10-CM | POA: Diagnosis not present

## 2021-07-21 DIAGNOSIS — Z7982 Long term (current) use of aspirin: Secondary | ICD-10-CM | POA: Diagnosis not present

## 2021-07-21 DIAGNOSIS — R9430 Abnormal result of cardiovascular function study, unspecified: Secondary | ICD-10-CM | POA: Diagnosis not present

## 2021-07-21 DIAGNOSIS — Z7902 Long term (current) use of antithrombotics/antiplatelets: Secondary | ICD-10-CM | POA: Insufficient documentation

## 2021-07-21 DIAGNOSIS — I779 Disorder of arteries and arterioles, unspecified: Secondary | ICD-10-CM | POA: Insufficient documentation

## 2021-07-21 HISTORY — PX: LEFT HEART CATH AND CORONARY ANGIOGRAPHY: CATH118249

## 2021-07-21 HISTORY — DX: Angina pectoris, unspecified: I20.9

## 2021-07-21 HISTORY — DX: Dyspnea, unspecified: R06.00

## 2021-07-21 HISTORY — DX: Gastro-esophageal reflux disease without esophagitis: K21.9

## 2021-07-21 HISTORY — DX: Hypothyroidism, unspecified: E03.9

## 2021-07-21 LAB — CARDIAC CATHETERIZATION: Cath EF Quantitative: 60 %

## 2021-07-21 SURGERY — LEFT HEART CATH AND CORONARY ANGIOGRAPHY
Anesthesia: Moderate Sedation

## 2021-07-21 MED ORDER — VERAPAMIL HCL 2.5 MG/ML IV SOLN
INTRAVENOUS | Status: AC
  Filled 2021-07-21: qty 2

## 2021-07-21 MED ORDER — SODIUM CHLORIDE 0.9 % WEIGHT BASED INFUSION: 1.0000 mL/kg/h | INTRAVENOUS | Status: DC

## 2021-07-21 MED ORDER — SODIUM CHLORIDE 0.9% FLUSH: 3.0000 mL | Freq: Two times a day (BID) | INTRAVENOUS | Status: DC

## 2021-07-21 MED ORDER — SODIUM CHLORIDE 0.9% FLUSH: 3.0000 mL | INTRAVENOUS | Status: DC | PRN

## 2021-07-21 MED ORDER — SODIUM CHLORIDE 0.9 % IV SOLN: 250.0000 mL | INTRAVENOUS | Status: DC | PRN

## 2021-07-21 MED ORDER — FENTANYL CITRATE (PF) 100 MCG/2ML IJ SOLN
INTRAMUSCULAR | Status: DC | PRN
  Administered 2021-07-21: 25 ug via INTRAVENOUS

## 2021-07-21 MED ORDER — SODIUM CHLORIDE 0.9 % WEIGHT BASED INFUSION
3.0000 mL/kg/h | INTRAVENOUS | Status: AC
  Administered 2021-07-21: 3 mL/kg/h via INTRAVENOUS

## 2021-07-21 MED ORDER — ASPIRIN 81 MG PO CHEW: 81.0000 mg | CHEWABLE_TABLET | ORAL | Status: DC

## 2021-07-21 MED ORDER — ASPIRIN 81 MG PO CHEW
CHEWABLE_TABLET | ORAL | Status: AC
  Filled 2021-07-21: qty 1

## 2021-07-21 MED ORDER — LIDOCAINE HCL (PF) 1 % IJ SOLN
INTRAMUSCULAR | Status: AC
  Filled 2021-07-21: qty 30

## 2021-07-21 MED ORDER — HEPARIN SODIUM (PORCINE) 1000 UNIT/ML IJ SOLN
INTRAMUSCULAR | Status: DC | PRN
  Administered 2021-07-21: 3500 [IU] via INTRAVENOUS

## 2021-07-21 MED ORDER — HEPARIN (PORCINE) IN NACL 2000-0.9 UNIT/L-% IV SOLN
INTRAVENOUS | Status: DC | PRN
  Administered 2021-07-21: 1000 mL

## 2021-07-21 MED ORDER — ONDANSETRON HCL 4 MG/2ML IJ SOLN: 4.0000 mg | Freq: Four times a day (QID) | INTRAMUSCULAR | Status: DC | PRN

## 2021-07-21 MED ORDER — LIDOCAINE HCL (PF) 1 % IJ SOLN
INTRAMUSCULAR | Status: DC | PRN
  Administered 2021-07-21: 2 mL

## 2021-07-21 MED ORDER — HEPARIN (PORCINE) IN NACL 1000-0.9 UT/500ML-% IV SOLN
INTRAVENOUS | Status: AC
  Filled 2021-07-21: qty 1000

## 2021-07-21 MED ORDER — ACETAMINOPHEN 325 MG PO TABS: 650.0000 mg | ORAL_TABLET | ORAL | Status: DC | PRN

## 2021-07-21 MED ORDER — FENTANYL CITRATE (PF) 100 MCG/2ML IJ SOLN
INTRAMUSCULAR | Status: AC
  Filled 2021-07-21: qty 2

## 2021-07-21 MED ORDER — LABETALOL HCL 5 MG/ML IV SOLN: 10.0000 mg | INTRAVENOUS | Status: DC | PRN

## 2021-07-21 MED ORDER — MIDAZOLAM HCL 2 MG/2ML IJ SOLN
INTRAMUSCULAR | Status: DC | PRN
  Administered 2021-07-21: 1 mg via INTRAVENOUS

## 2021-07-21 MED ORDER — IOHEXOL 300 MG/ML  SOLN
INTRAMUSCULAR | Status: DC | PRN
  Administered 2021-07-21: 64 mL

## 2021-07-21 MED ORDER — HEPARIN SODIUM (PORCINE) 1000 UNIT/ML IJ SOLN
INTRAMUSCULAR | Status: AC
  Filled 2021-07-21: qty 1

## 2021-07-21 MED ORDER — MIDAZOLAM HCL 2 MG/2ML IJ SOLN
INTRAMUSCULAR | Status: AC
  Filled 2021-07-21: qty 2

## 2021-07-21 MED ORDER — VERAPAMIL HCL 2.5 MG/ML IV SOLN
INTRAVENOUS | Status: DC | PRN
  Administered 2021-07-21: 2.5 mg via INTRAVENOUS

## 2021-07-21 MED ORDER — HYDRALAZINE HCL 20 MG/ML IJ SOLN: 10.0000 mg | INTRAMUSCULAR | Status: DC | PRN

## 2021-07-21 SURGICAL SUPPLY — 11 items

## 2021-07-22 ENCOUNTER — Encounter: Payer: Self-pay | Admitting: Internal Medicine

## 2022-02-16 DIAGNOSIS — R42 Dizziness and giddiness: Secondary | ICD-10-CM | POA: Insufficient documentation

## 2022-03-09 LAB — HM MAMMOGRAPHY

## 2022-03-09 LAB — HM DEXA SCAN: HM Dexa Scan: NEGATIVE

## 2022-03-26 IMAGING — DX DG CHEST 1V PORT
1 series · 1 of 1 positions shown · non-contrast
Comparison: None.

CLINICAL DATA: Chest pain.

EXAM:
PORTABLE CHEST 1 VIEW

[chest ap]
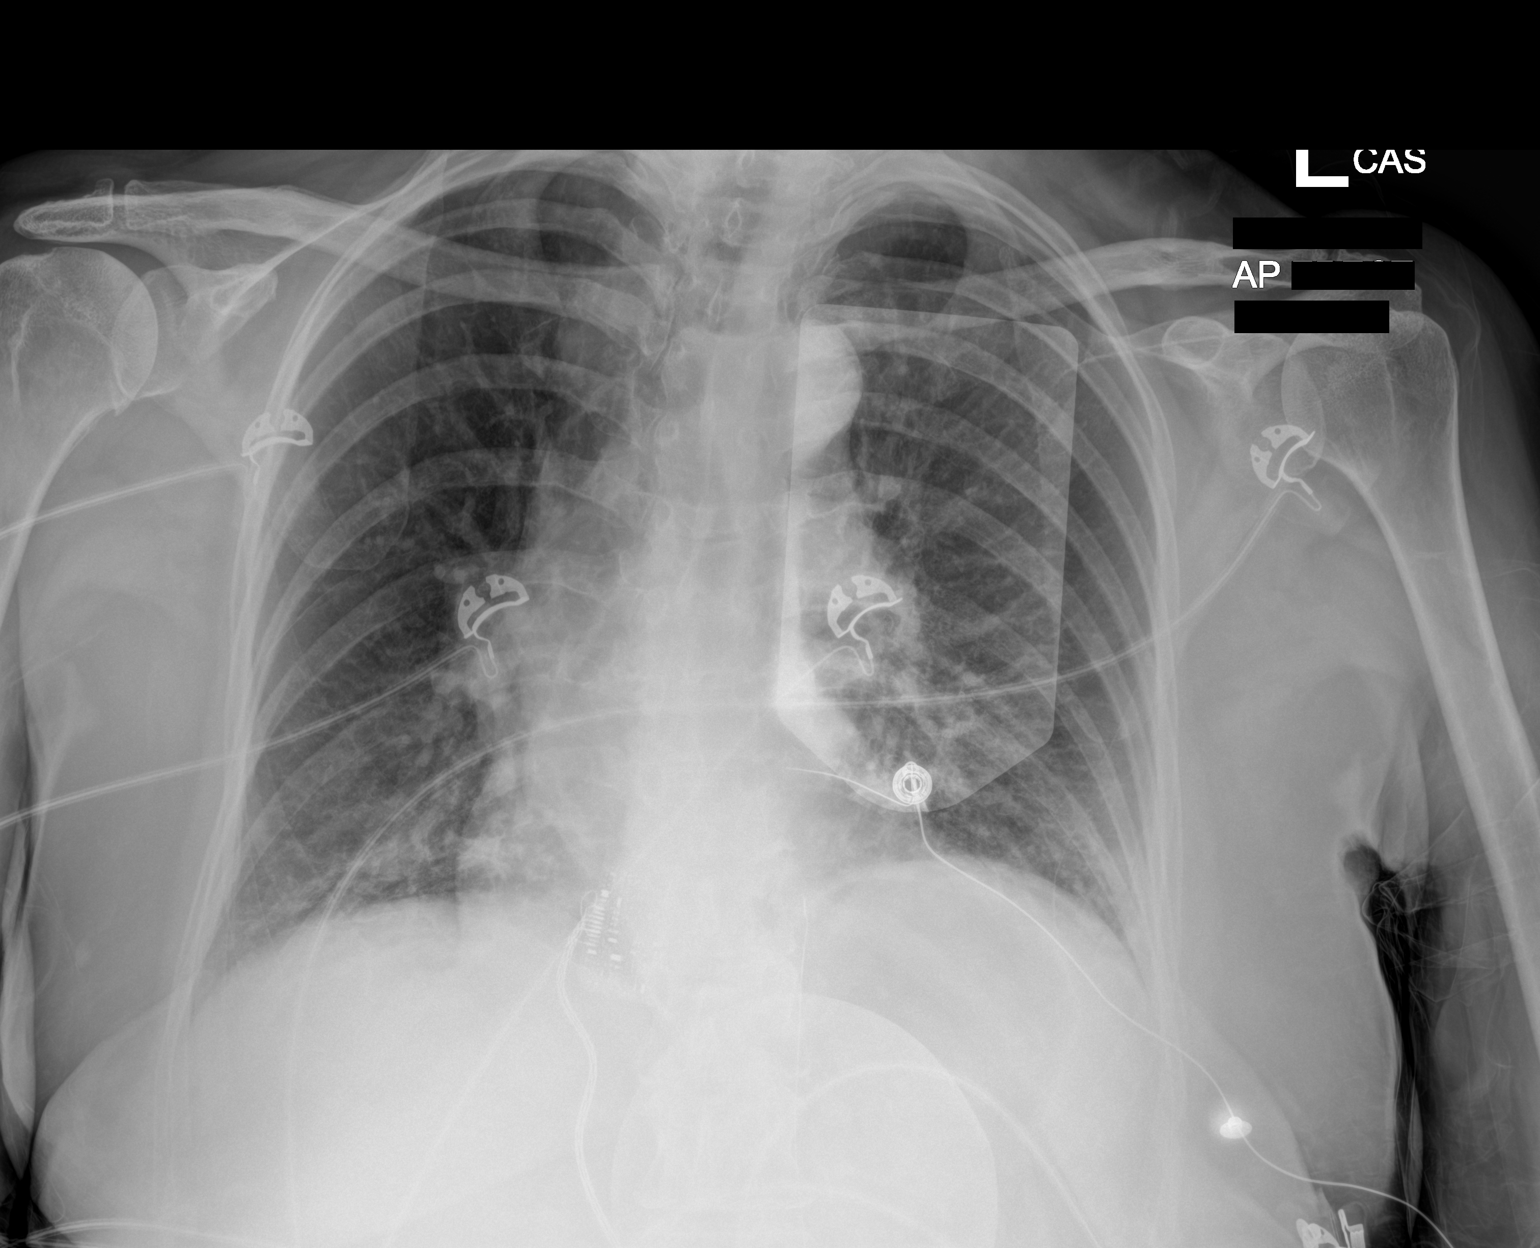

[1 of 1 positions shown; findings below may reference images not displayed]

FINDINGS: The heart size and mediastinal contours are within normal limits.
Mild patchy opacity of right lung base is noted. There is no pleural
effusion or pulmonary edema. The visualized skeletal structures are
unremarkable.
IMPRESSION: Mild patchy opacity of right lung base, suspicious for pneumonia.

## 2022-03-28 IMAGING — DX DG CHEST 1V
1 series · 1 of 1 positions shown · non-contrast
Comparison: Chest x-ray 12/22/2020

CLINICAL DATA: Shortness of breath.

EXAM:
CHEST  1 VIEW

[chest ap]
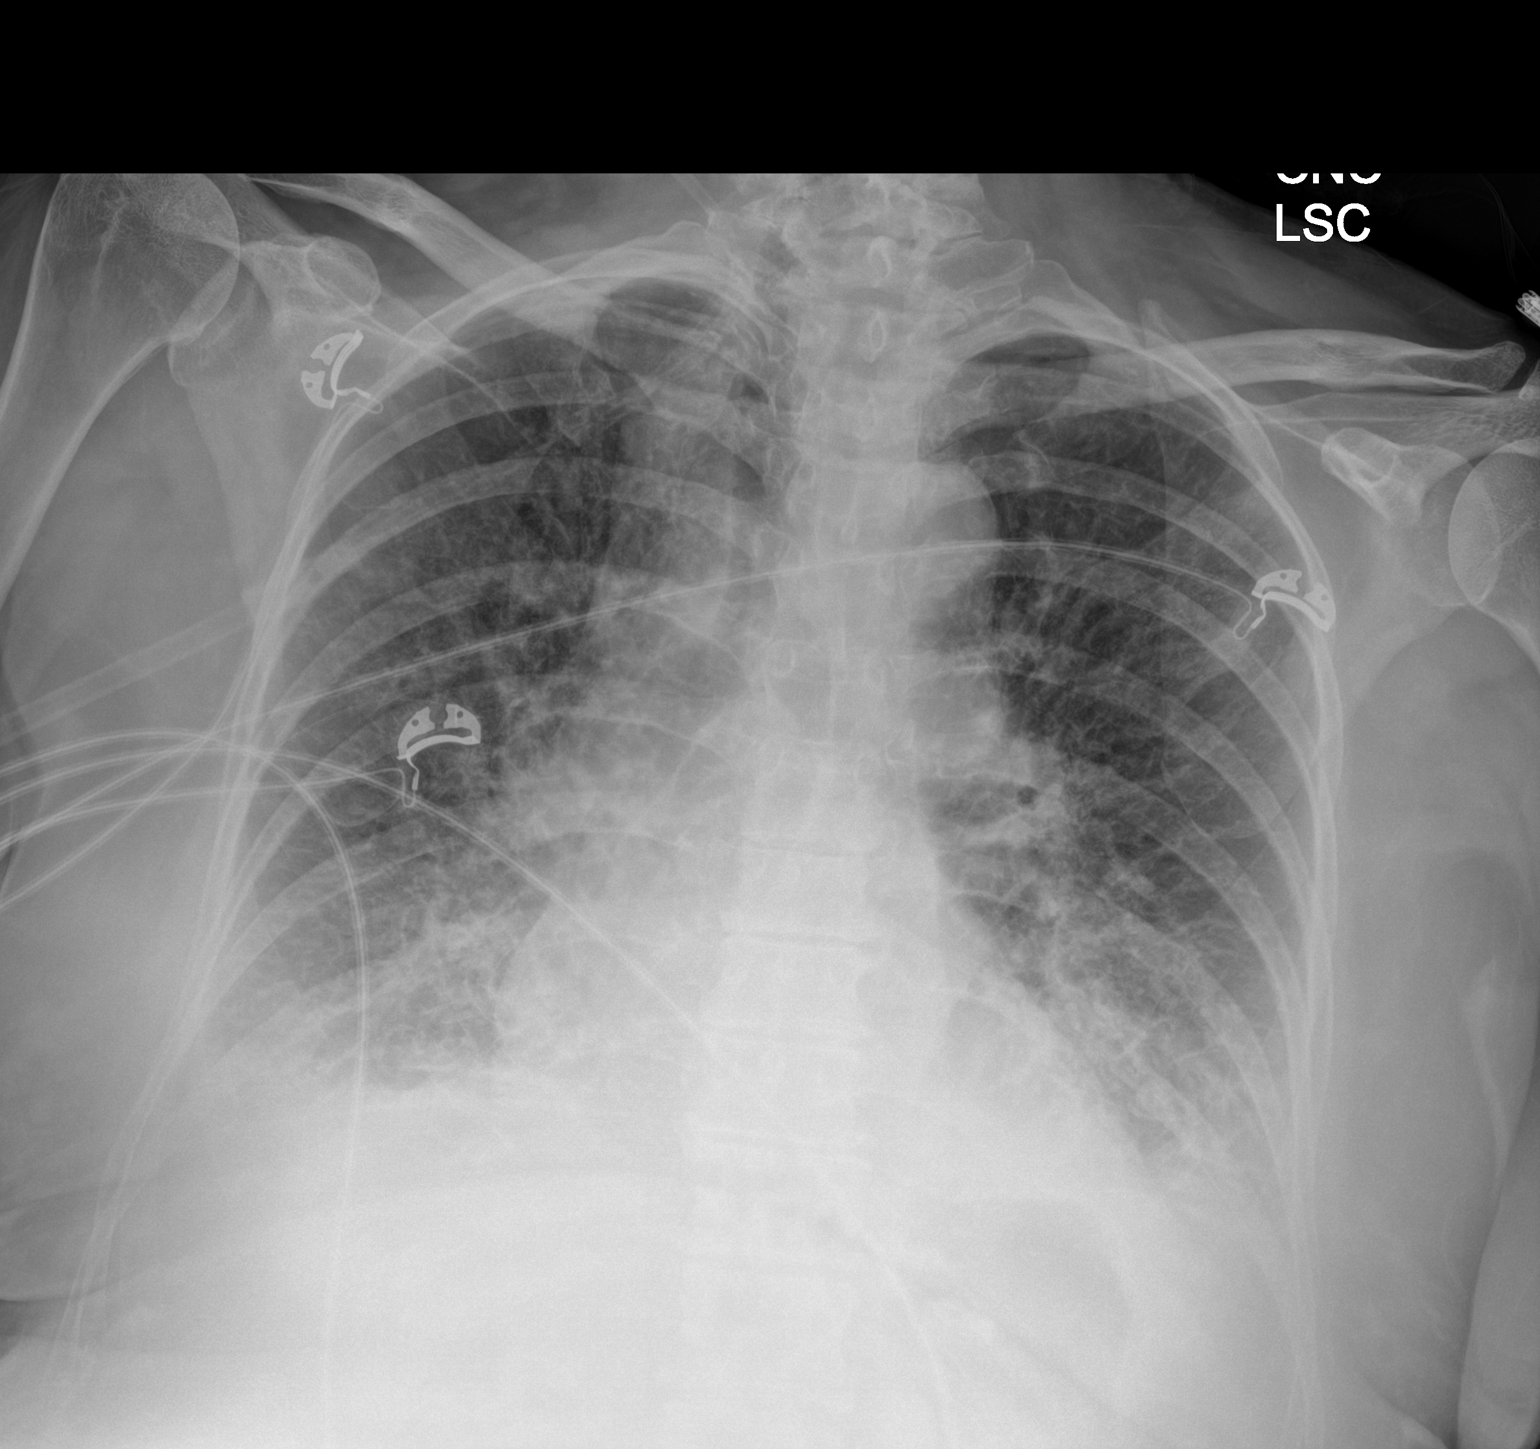

[1 of 1 positions shown; findings below may reference images not displayed]

FINDINGS: The external pacer paddles have been removed.

The cardiac silhouette, mediastinal and hilar contours are within
normal limits given the AP projection and portable technique.
Slightly prominent right paratracheal soft tissue shadow could be
related to vascular structures. There is mild tortuosity of the
aorta.

Worsening lung aeration with suspected pulmonary edema, small
pleural effusions and bibasilar atelectasis. No focal airspace
consolidation or pneumothorax.
IMPRESSION: Worsening lung aeration with suspected pulmonary edema, small
pleural effusions and bibasilar atelectasis.

## 2022-08-13 ENCOUNTER — Ambulatory Visit (INDEPENDENT_AMBULATORY_CARE_PROVIDER_SITE_OTHER): Payer: PPO | Admitting: Internal Medicine

## 2022-08-13 ENCOUNTER — Encounter: Payer: Self-pay | Admitting: Internal Medicine

## 2022-08-13 VITALS — BP 124/82 | HR 68 | Temp 97.7°F | Resp 18 | Ht 64.0 in | Wt 178.3 lb

## 2022-08-13 DIAGNOSIS — I5032 Chronic diastolic (congestive) heart failure: Secondary | ICD-10-CM

## 2022-08-13 DIAGNOSIS — H6123 Impacted cerumen, bilateral: Secondary | ICD-10-CM | POA: Diagnosis not present

## 2022-08-13 DIAGNOSIS — K219 Gastro-esophageal reflux disease without esophagitis: Secondary | ICD-10-CM

## 2022-08-13 DIAGNOSIS — D649 Anemia, unspecified: Secondary | ICD-10-CM

## 2022-08-13 DIAGNOSIS — M5432 Sciatica, left side: Secondary | ICD-10-CM

## 2022-08-13 DIAGNOSIS — G4733 Obstructive sleep apnea (adult) (pediatric): Secondary | ICD-10-CM | POA: Diagnosis not present

## 2022-08-13 DIAGNOSIS — I2510 Atherosclerotic heart disease of native coronary artery without angina pectoris: Secondary | ICD-10-CM

## 2022-08-13 DIAGNOSIS — G2581 Restless legs syndrome: Secondary | ICD-10-CM

## 2022-08-13 DIAGNOSIS — Z85038 Personal history of other malignant neoplasm of large intestine: Secondary | ICD-10-CM

## 2022-08-13 DIAGNOSIS — E039 Hypothyroidism, unspecified: Secondary | ICD-10-CM

## 2022-08-13 DIAGNOSIS — F330 Major depressive disorder, recurrent, mild: Secondary | ICD-10-CM

## 2022-08-13 DIAGNOSIS — I10 Essential (primary) hypertension: Secondary | ICD-10-CM | POA: Diagnosis not present

## 2022-08-13 DIAGNOSIS — I252 Old myocardial infarction: Secondary | ICD-10-CM

## 2022-08-13 MED ORDER — GABAPENTIN 400 MG PO CAPS: 400.0000 mg | ORAL_CAPSULE | Freq: Every day | ORAL | 1 refills | Status: DC

## 2022-08-13 MED ORDER — BUPROPION HCL ER (XL) 300 MG PO TB24: 300.0000 mg | ORAL_TABLET | Freq: Every day | ORAL | 1 refills | Status: DC

## 2022-08-13 NOTE — Patient Instructions (Addendum)
It was great seeing you today!  Plan discussed at today's visit: -Blood work ordered today, results will be uploaded to [facility].  -Continue medications, refilled today -Recommend Voltaren topical gel on butt and do stretches below. Avoid Ibuprofen   Follow up in: 3 months   Take care and let us know if you have any questions or concerns prior to your next visit.  Dr. Rosana Berger   Piriformis Syndrome Rehab Ask your health care provider which exercises are safe for you. Do exercises exactly as told by your health care provider and adjust them as directed. It is normal to feel mild stretching, pulling, tightness, or discomfort as you do these exercises. Stop right away if you feel sudden pain or your pain gets worse. Do not begin these exercises until told by your health care provider. Stretching and range-of-motion exercises These exercises warm up your muscles and joints and improve the movement and flexibility of your hip and pelvis. The exercises also help to relieve pain, numbness, and tingling. Nerve root  Sit on a firm surface that is high enough that you can swing your left / right foot freely. Place a folded towel under your left / right thigh. This is optional. Drop your head forward and round your back. While you keep your left / right foot relaxed, slowly straighten your left / right knee until you feel a slight pull behind your knee or calf. If your leg is fully extended and you still do not feel a pull, slowly tilt your foot and toes toward you. Hold this position for __________ seconds. Slowly return your knee to its starting position. Hip rotation This is an exercise in which you lie on your back and stretch the muscles that rotate your hip (hip rotators) to stretch your buttocks. Lie on your back on a firm surface. Pull your left / right knee toward your same shoulder with your left / right hand until your knee is pointing toward the ceiling. Hold your left / right ankle with  your other hand. Keeping your knee steady, gently pull your left / right ankle toward your other shoulder until you feel a stretch in your buttocks. Hold this position for __________ seconds. Repeat __________ times. Complete this exercise __________ times a day. Hip extensor This is an exercise in which you lie on your back and pull your knee to your chest. Lie on your back on a firm surface. Both of your legs should be straight. Pull your left / right knee to your chest. Hold your leg in this position by holding on to the back of your thigh or the front of your knee. Hold this position for __________ seconds. Slowly return to the starting position. Repeat __________ times. Complete this exercise __________ times a day. Strengthening exercises These exercises build strength and endurance in your hip and thigh muscles. Endurance is the ability to use your muscles for a long time, even after they get tired. Straight leg raises, side-lying This exercise strengthens the muscles that rotate the leg at the hip and move it away from your body (hip abductors). Lie on your side with your left / right leg in the top position. Lie so your head, shoulder, knee, and hip line up. Bend your bottom knee to help you balance. Lift your top leg 4-6 inches (10-15 cm) while keeping your toes pointed straight ahead. Hold this position for __________ seconds. Slowly lower your leg to the starting position. Let your muscles relax completely after each  repetition. Repeat __________ times. Complete this exercise __________ times a day. Hip abduction and rotation This is sometimes called quadruped (on hands and knees) exercises. Get on your hands and knees on a firm, lightly padded surface. Your hands should be directly below your shoulders, and your knees should be directly below your hips. Lift your left / right knee out to the side. Keep your knee bent. Do not twist your body. Hold this position for __________  seconds. Slowly lower your leg. Repeat __________ times. Complete this exercise __________ times a day. Straight leg raises, prone This exercise stretches the muscles that move the hips (hip extensors). Lie on your abdomen on a firm surface (prone position). Tense the muscles in your buttocks and lift your left / right leg about 4 inches (10 cm). Keep your knee straight as you lift your leg. If you cannot lift your leg that high without arching your back, place a pillow under your hips. Hold this position for __________ seconds. Slowly lower your leg to the starting position. Let your muscles relax completely after each repetition. Repeat __________ times. Complete this exercise __________ times a day. This information is not intended to replace advice given to you by your health care provider. Make sure you discuss any questions you have with your health care provider. Document Revised: 06/10/2021 Document Reviewed: 06/10/2021 Elsevier Patient Education  [place].

## 2022-08-13 NOTE — Progress Notes (Signed)
New Patient Office Visit  Subjective    Patient ID: Karen Madden, female    DOB: [birth year]  Age: 76 y.o. MRN: [id number]  CC:  Chief Complaint  Patient presents with   Establish Care    HPI Karen Madden presents to establish care.  Hx of STEMI/HTN/OSA/CHFpEF: -Medications: Lasix 20 mg, HCTZ 25 mg, KCl 20 meQ, Metoprolol 25 mg, nitroglycerin PRN -Patient is compliant with above medications and reports no side effects. -Checking BP at home (average): 120-130/80 -Denies any SOB, CP, vision changes, LE edema or symptoms of hypotension -Diet: Working on diet, has gained 10 pounds -Most recent echo 6/22 EF >55% -Hx of STEMI 12/22/20 - 1 stent in LAD, following with Cardiology currently, last seen 04/14/22 -Most recent cath 08/09/21 showing widely patent stent in LAD, EF 60% -Working on CPAP right now for OSA  CAD/HLD: -Medications: aspirin 81, Plavix 75 mg, Lipitor 80 mg (held for 90 days per Cardiology recommendation to see if fatigue resolves, uncertain if she did this), Zetia 10 mg  -Patient is compliant with above medications and reports no side effects.  -Last lipid panel: Lipid Panel     Component Value Date/Time   CHOL 191 12/22/2020 1521   TRIG 144 12/22/2020 1521   HDL 70 12/22/2020 1521   CHOLHDL 2.7 12/22/2020 1521   VLDL 29 12/22/2020 1521   LDLCALC 92 12/22/2020 1521   Hypothyroidism: -Medications: Levothyroxine 75 mcg  -Patient is compliant with the above medication (s) at the above dose and reports no medication side effects.  -Denies weight changes, cold./heat intolerance, skin changes, anxiety/palpitations  -Last TSH: 2.288 2/23  GERD: -Currently using Pepcid 20 mg over the counter PRN  Hx of Colon Cancer:  -First diagnosed in 2016 - had a large polyp on cecum. Then hgb was low, passed out. Had a second tumor removed in 2017. Had a partial colectomy.  -Last CBC showing hgb 10.3 in 2/23  MDD: -Mood status: stable -Current treatment: Wellbutrin 300 mg   -Satisfied with current treatment?: yes -Symptom severity: mild  -Duration of current treatment : years -Side effects: no Medication compliance: excellent compliance     08/13/2022    9:40 AM 03/31/2021    2:21 PM 01/22/2021   12:44 PM  Depression screen PHQ 2/9  Decreased Interest 0 2 0  Down, Depressed, Hopeless 0 2 0  PHQ - 2 Score 0 4 0  Altered sleeping 0 2 1  Tired, decreased energy 0 2 1  Change in appetite 0 1 0  Feeling bad or failure about yourself  0 0 0  Trouble concentrating 0 0 0  Moving slowly or fidgety/restless 0 0 0  Suicidal thoughts 0 0 0  PHQ-9 Score 0 9 2  Difficult doing work/chores Not difficult at all Somewhat difficult Not difficult at all   Health Maintenance: -Blood work UTD -Mammogram 3/23 Birads-1  Outpatient Encounter Medications as of 08/13/2022  Medication Sig   aspirin 81 MG chewable tablet Chew 81 mg by mouth daily.   buPROPion (WELLBUTRIN XL) 300 MG 24 hr tablet Take 300 mg by mouth daily.   Cholecalciferol 125 MCG (5000 UT) TABS Take 5,000 Units by mouth daily.   clopidogrel (PLAVIX) 75 MG tablet Take 75 mg by mouth daily.   diphenhydrAMINE (SIMPLY SLEEP) 25 MG tablet Take 25 mg by mouth at bedtime as needed for sleep.   ezetimibe (ZETIA) 10 MG tablet Take 10 mg by mouth daily.   famotidine (PEPCID) 20 MG tablet  Take 20 mg by mouth 2 (two) times daily.   gabapentin (NEURONTIN) 400 MG capsule Take 400 mg by mouth at bedtime.   hydrochlorothiazide (HYDRODIURIL) 25 MG tablet Take 25 mg by mouth daily.   KLOR-CON M20 20 MEQ tablet Take 20 mEq by mouth daily.   levothyroxine (SYNTHROID) 75 MCG tablet Take 75 mcg by mouth daily before breakfast.   loperamide (IMODIUM A-D) 2 MG tablet Take 2 mg by mouth 4 (four) times daily as needed for diarrhea or loose stools.   Multiple Vitamins-Minerals (PRESERVISION AREDS) TABS Take 1 tablet by mouth daily.   nitroGLYCERIN (NITROSTAT) 0.4 MG SL tablet Place 0.4 mg under the tongue every 2 (two) hours as  needed for chest pain.   triamcinolone (KENALOG) 0.025 % cream Apply 1 application topically daily as needed ([place]ash).   atorvastatin (LIPITOR) 80 MG tablet Take 1 tablet (80 mg total) by mouth daily.   furosemide (LASIX) 40 MG tablet Take 1 tablet (40 mg total) by mouth daily. (Patient taking differently: Take 20 mg by mouth daily.)   metoprolol succinate (TOPROL-XL) 25 MG 24 hr tablet Take 0.5 tablets (12.5 mg total) by mouth daily. (Patient taking differently: Take 25 mg by mouth daily.)   [DISCONTINUED] furosemide (LASIX) 20 MG tablet Take 20 mg by mouth.   [DISCONTINUED] isosorbide mononitrate (IMDUR) 30 MG 24 hr tablet Take 30 mg by mouth daily.   [DISCONTINUED] losartan (COZAAR) 25 MG tablet Take 25 mg by mouth daily.   [DISCONTINUED] spironolactone (ALDACTONE) 25 MG tablet Take 1 tablet (25 mg total) by mouth daily. (Patient not taking: Reported on 07/14/2021)   No facility-administered encounter medications on file as of 08/13/2022.    Past Medical History:  Diagnosis Date   Allergy Na   Anginal pain ([place])    Arthritis NA   Cancer ([place]) 2017   Cataract 2005 & 2007   CHF (congestive heart failure) ([facility])    Coronary artery disease    Dyspnea    GERD (gastroesophageal reflux disease)    Hypertension    Hypothyroidism    Myocardial infarction ([place]) 12/22/20   Sleep apnea 2023    Past Surgical History:  Procedure Laterality Date   ABDOMINAL HYSTERECTOMY  1991   APPENDECTOMY  2016   CHOLECYSTECTOMY  2016   COLON SURGERY  2016 & 2017   CORONARY/GRAFT ACUTE MI REVASCULARIZATION N/A 12/22/2020   Procedure: Coronary/Graft Acute MI Revascularization;  Surgeon: Yolonda Kida, MD;  Location: [facility];  Service: Cardiovascular;  Laterality: N/A;   HERNIA REPAIR  2021   LEFT HEART CATH AND CORONARY ANGIOGRAPHY N/A 12/22/2020   Procedure: LEFT HEART CATH AND CORONARY ANGIOGRAPHY;  Surgeon: Yolonda Kida, MD;  Location: [facility];  Service: Cardiovascular;   Laterality: N/A;   LEFT HEART CATH AND CORONARY ANGIOGRAPHY N/A 07/21/2021   Procedure: LEFT HEART CATH AND CORONARY ANGIOGRAPHY with possible Intervention;  Surgeon: Yolonda Kida, MD;  Location: [facility];  Service: Cardiovascular;  Laterality: N/A;    Family History  Problem Relation Age of Onset   Heart disease Mother    Hypertension Mother    Obesity Mother    Vision loss Mother    Alcohol abuse Father    Cancer Brother    Diabetes Sister    Varicose Veins Daughter     Social History   Socioeconomic History   Marital status: Married    Spouse name: Not on file   Number of children: Not on  file   Years of education: Not on file   Highest education level: Not on file  Occupational History   Not on file  Tobacco Use   Smoking status: Never   Smokeless tobacco: Never  Vaping Use   Vaping Use: Never used  Substance and Sexual Activity   Alcohol use: Not Currently   Drug use: Never   Sexual activity: Yes  Other Topics Concern   Not on file  Social History Narrative   Not on file   Social Determinants of Health   Financial Resource Strain: Not on file  Food Insecurity: Not on file  Transportation Needs: Not on file  Physical Activity: Not on file  Stress: Not on file  Social Connections: Not on file  Intimate Partner Violence: Not on file    Review of Systems  Constitutional:  Negative for chills and fever.  Eyes:  Negative for blurred vision.  Respiratory:  Positive for shortness of breath. Negative for cough and wheezing.   Cardiovascular:  Negative for chest pain and palpitations.  Gastrointestinal:  Negative for abdominal pain, blood in stool, heartburn, melena, nausea and vomiting.  Neurological:  Positive for tingling. Negative for dizziness.      Objective    BP 124/82   Pulse 68   Temp 97.7 F (36.5 C)   Resp 18   Ht '5\' 4"'$  (1.626 m)   Wt 178 lb 4.8 oz (80.9 kg)   SpO2 98%   BMI 30.61 kg/m   Physical Exam Constitutional:       Appearance: Normal appearance.  HENT:     Head: Normocephalic and atraumatic.     Right Ear: Ear canal and external ear normal. There is impacted cerumen.     Left Ear: Ear canal and external ear normal. There is impacted cerumen.     Mouth/Throat:     Mouth: Mucous membranes are moist.     Pharynx: Oropharynx is clear.  Eyes:     Conjunctiva/sclera: Conjunctivae normal.  Neck:     Vascular: No carotid bruit.  Cardiovascular:     Rate and Rhythm: Normal rate and regular rhythm.  Pulmonary:     Effort: Pulmonary effort is normal.     Breath sounds: Normal breath sounds.  Musculoskeletal:     Right lower leg: No edema.     Left lower leg: No edema.  Skin:    General: Skin is warm and dry.  Neurological:     General: No focal deficit present.     Mental Status: She is alert. Mental status is at baseline.  Psychiatric:        Mood and Affect: Mood normal.        Behavior: Behavior normal.         Assessment & Plan:   1. Primary hypertension/Chronic heart failure with preserved ejection fraction [facility]): Blood pressure stable today, continue HCTZ 25 mg, Metoprolol 25 mg, and Lasix 20 mg. Check kidney function today. Reviewed last echo results from 8/22.   - COMPLETE METABOLIC PANEL WITH GFR  2. History of ST elevation myocardial infarction (STEMI)/Coronary artery disease, unspecified vessel or lesion type, unspecified whether angina present, unspecified whether native or transplanted heart: Has one stent in LAD, following with Cardiology. Note from 04/14/22 reviewed. Now on Lipitor 80 mg, Zetia 10 mg, Plavix 75, aspirin 81 mg daily.    3.OSA (obstructive sleep apnea): Has CPAP, working on titration studies. Following with Pulmonology 08/06/22.   4. Hypothyroidism, unspecified type: Currently on Levothyroxine  75 mcg daily, recheck TSH today.  - TSH  5. Anemia, unspecified type/History of colon cancer: Recheck CBC, ferritin levels. If decreased, will obtain fecal occult.    - CBC w/Diff/Platelet - Fe+TIBC+Fer  6. Mild episode of recurrent major depressive disorder ([place]): Stable, on medication since her sister passed away in 03/06/17. Continue Wellbutrin XL 300 mg daily.   - buPROPion (WELLBUTRIN XL) 300 MG 24 hr tablet; Take 1 tablet (300 mg total) by mouth daily.  Dispense: 90 tablet; Refill: 1  7. Gastroesophageal reflux disease, unspecified whether esophagitis present: Stable, taking Pepcid 20 mg PRN.   8. Restless leg: Continue Gabapentin 400 mg at night.   - gabapentin (NEURONTIN) 400 MG capsule; Take 1 capsule (400 mg total) by mouth at bedtime.  Dispense: 90 capsule; Refill: 1  9. Sciatica of left side: Cannot take anti-inflammatories while on Plavix but recommend topical Voltaren and stretches provided.   10. Bilateral impacted cerumen: Ear lavage today, patient became dizzy so was not completed. Recommend Debrox drops, recheck at follow up.   - Ear Lavage  Return in about 3 months (around 11/13/2022).   Teodora Medici, DO

## 2022-08-14 ENCOUNTER — Other Ambulatory Visit: Payer: Self-pay | Admitting: Internal Medicine

## 2022-08-14 ENCOUNTER — Encounter: Payer: Self-pay | Admitting: Internal Medicine

## 2022-08-14 DIAGNOSIS — D649 Anemia, unspecified: Secondary | ICD-10-CM

## 2022-08-14 LAB — CBC WITH DIFFERENTIAL/PLATELET
Absolute Monocytes: 761 cells/uL (ref 200–950)
Basophils Absolute: 20 cells/uL (ref 0–200)
Basophils Relative: 0.3 %
Eosinophils Absolute: 182 cells/uL (ref 15–500)
Eosinophils Relative: 2.8 %
HCT: 32.6 % — ABNORMAL LOW (ref 35.0–45.0)
Hemoglobin: 10.4 g/dL — ABNORMAL LOW (ref 11.7–15.5)
Lymphs Abs: 1105 cells/uL (ref 850–3900)
MCH: 25.3 pg — ABNORMAL LOW (ref 27.0–33.0)
MCHC: 31.9 g/dL — ABNORMAL LOW (ref 32.0–36.0)
MCV: 79.3 fL — ABNORMAL LOW (ref 80.0–100.0)
MPV: 9.4 fL (ref 7.5–12.5)
Monocytes Relative: 11.7 %
Neutro Abs: 4433 cells/uL (ref 1500–7800)
Neutrophils Relative %: 68.2 %
Platelets: 342 10*3/uL (ref 140–400)
RBC: 4.11 10*6/uL (ref 3.80–5.10)
RDW: 15.8 % — ABNORMAL HIGH (ref 11.0–15.0)
Total Lymphocyte: 17 %
WBC: 6.5 10*3/uL (ref 3.8–10.8)

## 2022-08-14 LAB — COMPLETE METABOLIC PANEL WITH GFR
AG Ratio: 2.2 (calc) (ref 1.0–2.5)
ALT: 19 U/L (ref 6–29)
AST: 19 U/L (ref 10–35)
Albumin: 4.4 g/dL (ref 3.6–5.1)
Alkaline phosphatase (APISO): 69 U/L (ref 37–153)
BUN/Creatinine Ratio: 23 (calc) — ABNORMAL HIGH (ref 6–22)
BUN: 32 mg/dL — ABNORMAL HIGH (ref 7–25)
CO2: 28 mmol/L (ref 20–32)
Calcium: 9.7 mg/dL (ref 8.6–10.4)
Chloride: 102 mmol/L (ref 98–110)
Creat: 1.4 mg/dL — ABNORMAL HIGH (ref 0.60–1.00)
Globulin: 2 g/dL (calc) (ref 1.9–3.7)
Glucose, Bld: 109 mg/dL — ABNORMAL HIGH (ref 65–99)
Potassium: 4.1 mmol/L (ref 3.5–5.3)
Sodium: 141 mmol/L (ref 135–146)
Total Bilirubin: 0.5 mg/dL (ref 0.2–1.2)
Total Protein: 6.4 g/dL (ref 6.1–8.1)
eGFR: 39 mL/min/{1.73_m2} — ABNORMAL LOW (ref >=60)

## 2022-08-14 LAB — IRON,TIBC AND FERRITIN PANEL
%SAT: 11 % (calc) — ABNORMAL LOW (ref 16–45)
Ferritin: 12 ng/mL — ABNORMAL LOW (ref 16–288)
Iron: 62 ug/dL (ref 45–160)
TIBC: 568 mcg/dL (calc) — ABNORMAL HIGH (ref 250–450)

## 2022-08-14 LAB — TSH: TSH: 2.13 mIU/L (ref 0.40–4.50)

## 2022-08-14 MED ORDER — IRON (FERROUS SULFATE) 325 (65 FE) MG PO TABS: 325.0000 mg | ORAL_TABLET | Freq: Every day | ORAL | 1 refills | Status: DC

## 2022-08-14 NOTE — Addendum Note (Signed)
Addended by: Teodora Medici on: 08/14/2022 01:03 PM   Modules accepted: Orders

## 2022-08-17 ENCOUNTER — Other Ambulatory Visit: Payer: Self-pay | Admitting: Internal Medicine

## 2022-08-17 DIAGNOSIS — I5032 Chronic diastolic (congestive) heart failure: Secondary | ICD-10-CM

## 2022-08-17 MED ORDER — FUROSEMIDE 20 MG PO TABS: 10.0000 mg | ORAL_TABLET | Freq: Every day | ORAL | 2 refills | Status: DC

## 2022-08-18 ENCOUNTER — Other Ambulatory Visit: Payer: Self-pay

## 2022-08-18 ENCOUNTER — Inpatient Hospital Stay: Payer: PPO

## 2022-08-18 ENCOUNTER — Encounter: Payer: Self-pay | Admitting: Internal Medicine

## 2022-08-18 ENCOUNTER — Inpatient Hospital Stay: Payer: PPO | Attending: Internal Medicine | Admitting: Internal Medicine

## 2022-08-18 VITALS — BP 143/94 | HR 62 | Temp 98.0°F | Resp 18

## 2022-08-18 DIAGNOSIS — K219 Gastro-esophageal reflux disease without esophagitis: Secondary | ICD-10-CM | POA: Insufficient documentation

## 2022-08-18 DIAGNOSIS — D509 Iron deficiency anemia, unspecified: Secondary | ICD-10-CM

## 2022-08-18 DIAGNOSIS — I2510 Atherosclerotic heart disease of native coronary artery without angina pectoris: Secondary | ICD-10-CM | POA: Insufficient documentation

## 2022-08-18 DIAGNOSIS — N1832 Chronic kidney disease, stage 3b: Secondary | ICD-10-CM | POA: Diagnosis not present

## 2022-08-18 DIAGNOSIS — I10 Essential (primary) hypertension: Secondary | ICD-10-CM | POA: Insufficient documentation

## 2022-08-18 DIAGNOSIS — Z7982 Long term (current) use of aspirin: Secondary | ICD-10-CM | POA: Insufficient documentation

## 2022-08-18 DIAGNOSIS — Z7902 Long term (current) use of antithrombotics/antiplatelets: Secondary | ICD-10-CM | POA: Insufficient documentation

## 2022-08-18 DIAGNOSIS — Z79899 Other long term (current) drug therapy: Secondary | ICD-10-CM | POA: Diagnosis not present

## 2022-08-18 DIAGNOSIS — I509 Heart failure, unspecified: Secondary | ICD-10-CM | POA: Insufficient documentation

## 2022-08-18 DIAGNOSIS — Z85038 Personal history of other malignant neoplasm of large intestine: Secondary | ICD-10-CM | POA: Diagnosis not present

## 2022-08-18 DIAGNOSIS — Z79890 Hormone replacement therapy (postmenopausal): Secondary | ICD-10-CM | POA: Insufficient documentation

## 2022-08-18 DIAGNOSIS — G4730 Sleep apnea, unspecified: Secondary | ICD-10-CM | POA: Insufficient documentation

## 2022-08-18 DIAGNOSIS — I130 Hypertensive heart and chronic kidney disease with heart failure and stage 1 through stage 4 chronic kidney disease, or unspecified chronic kidney disease: Secondary | ICD-10-CM | POA: Insufficient documentation

## 2022-08-18 DIAGNOSIS — K30 Functional dyspepsia: Secondary | ICD-10-CM | POA: Diagnosis not present

## 2022-08-18 DIAGNOSIS — I252 Old myocardial infarction: Secondary | ICD-10-CM | POA: Insufficient documentation

## 2022-08-18 DIAGNOSIS — E039 Hypothyroidism, unspecified: Secondary | ICD-10-CM | POA: Diagnosis not present

## 2022-08-18 HISTORY — DX: Iron deficiency anemia, unspecified: D50.9

## 2022-08-18 LAB — URINALYSIS, COMPLETE (UACMP) WITH MICROSCOPIC
Bacteria, UA: NONE SEEN
Bilirubin Urine: NEGATIVE
Glucose, UA: NEGATIVE mg/dL
Hgb urine dipstick: NEGATIVE
Ketones, ur: NEGATIVE mg/dL
Nitrite: NEGATIVE
Protein, ur: NEGATIVE mg/dL
Specific Gravity, Urine: 1.006 (ref 1.005–1.030)
pH: 7 (ref 5.0–8.0)

## 2022-08-18 NOTE — Progress Notes (Signed)
Patient here today for initial visit regarding anemia.

## 2022-08-18 NOTE — Progress Notes (Signed)
[place]  [phone] [phone]) [phone]  ID: Karen Madden OB: 10-Jul-1946  MR#: [id number]  YCX#:[id number]  Patient Care Team: Teodora Medici, DO as PCP - General (Internal Medicine)  REFERRING PROVIDER: Dr. Rosana Berger  REASON FOR REFERRAL: iron deficiency anemia   HPI: Karen Madden is a 76 y.o. female with pmh of CAD, HTN, hypothyroidism, OSA and colon adenocarcinoma pT3N0 status post resection on 10/12/2016 by Dr. Donalee Citrin at [place] did not receive adjuvant treatment was seen for iron deficiency anemia.   Patient reported some burning sensation in the epigastric region when eating tomato sauce which is new for her.  She denies any blood in urine or stools.  Denies any black tarry stools. Patient denies fever, chills, nausea, vomiting, shortness of breath, cough, abdominal pain, bowel or bladder issues. Energy level is good.  Appetite is good.  Denies any weight loss.  Patient has a history of stage II colon cancer status post resection on 10/12/2016, pT3N0 by Dr. Donalee Citrin at [facility].  She did not require any adjuvant treatment.  She was tested for Lynch syndrome which was negative.  This was diagnosed when she developed iron deficiency anemia.  Her last colonoscopy was 3 years ago which showed internal hemorrhoids otherwise normal.  She reports family history of colon cancer in brother at age 57.  She is understandably concerned about recurrence with developing iron deficiency again.  She had bladder prolapse surgery twice and requested for urine to be checked.  She noticed change in the smell of the urine otherwise denies any burning, pain, frequency or urgency.  She is started oral iron about a week ago which is causing GI upset.  She has received IV iron in the past and tolerated well.  Labs reviewed.  CBC showed hemoglobin of 10.4 and ferritin 12. On recent blood work she was found to have CKD stage III.  She reports using ibuprofen for her left knee  pain which started after a fall and thinks might have overused it.  She stopped it now and has been using Tylenol.  She is also seeing orthopedics for right shoulder pain and receiving cortisone shots.  She is also on diuretics for her history of CHF which is being managed by her PCP.  REVIEW OF SYSTEMS:   ROS  As per HPI. Otherwise, a complete review of systems is negative.  PAST MEDICAL HISTORY: Past Medical History:  Diagnosis Date   Allergy Na   Anginal pain ([place])    Arthritis NA   Cancer ([place]) 2017   Cataract 2005 & 2007   CHF (congestive heart failure) (HCC)    Coronary artery disease    Dyspnea    GERD (gastroesophageal reflux disease)    Hypertension    Hypothyroidism    Myocardial infarction ([place]) 12/22/20   Sleep apnea 2023    PAST SURGICAL HISTORY: Past Surgical History:  Procedure Laterality Date   ABDOMINAL HYSTERECTOMY  1991   APPENDECTOMY  2016   BLADDER SURGERY     2011   BUNIONECTOMY     CATARACT EXTRACTION Bilateral    CHOLECYSTECTOMY  2016   COLON SURGERY  2016 & 2017   CORONARY/GRAFT ACUTE MI REVASCULARIZATION N/A 12/22/2020   Procedure: Coronary/Graft Acute MI Revascularization;  Surgeon: Yolonda Kida, MD;  Location: [place] [facility];  Service: Cardiovascular;  Laterality: N/A;   HERNIA REPAIR  2021   LEFT HEART CATH AND CORONARY ANGIOGRAPHY N/A 12/22/2020   Procedure: LEFT HEART CATH  AND CORONARY ANGIOGRAPHY;  Surgeon: Yolonda Kida, MD;  Location: [place] [facility];  Service: Cardiovascular;  Laterality: N/A;   LEFT HEART CATH AND CORONARY ANGIOGRAPHY N/A 07/21/2021   Procedure: LEFT HEART CATH AND CORONARY ANGIOGRAPHY with possible Intervention;  Surgeon: Yolonda Kida, MD;  Location: [place] [facility];  Service: Cardiovascular;  Laterality: N/A;    FAMILY HISTORY: Family History  Problem Relation Age of Onset   Heart disease Mother    Hypertension Mother    Obesity Mother    Vision loss Mother    Alcohol abuse Father     Cancer Brother    Diabetes Sister    Varicose Veins Daughter     HEALTH MAINTENANCE: Social History   Tobacco Use   Smoking status: Never   Smokeless tobacco: Never  Vaping Use   Vaping Use: Never used  Substance Use Topics   Alcohol use: Not Currently   Drug use: Never     Allergies  Allergen Reactions   Cyclobenzaprine Hives   Morphine And Related Anxiety    Hallucinations, nausea, vomiting, "doesn't help my pain at all"   Tetracyclines & Related Nausea And Vomiting    Current Outpatient Medications  Medication Sig Dispense Refill   aspirin 81 MG chewable tablet Chew 81 mg by mouth daily.     buPROPion (WELLBUTRIN XL) 300 MG 24 hr tablet Take 1 tablet (300 mg total) by mouth daily. 90 tablet 1   Cholecalciferol 125 MCG (5000 UT) TABS Take 5,000 Units by mouth daily.     clopidogrel (PLAVIX) 75 MG tablet Take 75 mg by mouth daily.     diphenhydrAMINE (SIMPLY SLEEP) 25 MG tablet Take 25 mg by mouth at bedtime as needed for sleep.     ezetimibe (ZETIA) 10 MG tablet Take 10 mg by mouth daily.     famotidine (PEPCID) 20 MG tablet Take 20 mg by mouth 2 (two) times daily.     furosemide (LASIX) 20 MG tablet Take 0.5 tablets (10 mg total) by mouth daily. 30 tablet 2   gabapentin (NEURONTIN) 400 MG capsule Take 1 capsule (400 mg total) by mouth at bedtime. 90 capsule 1   hydrochlorothiazide (HYDRODIURIL) 25 MG tablet Take 25 mg by mouth daily.     Iron, Ferrous Sulfate, 325 (65 Fe) MG TABS Take 325 mg by mouth daily. 30 tablet 1   KLOR-CON M20 20 MEQ tablet Take 20 mEq by mouth daily.     levothyroxine (SYNTHROID) 75 MCG tablet Take 75 mcg by mouth daily before breakfast.     loperamide (IMODIUM A-D) 2 MG tablet Take 2 mg by mouth 4 (four) times daily as needed for diarrhea or loose stools.     Multiple Vitamins-Minerals (PRESERVISION AREDS) TABS Take 1 tablet by mouth daily.     nitroGLYCERIN (NITROSTAT) 0.4 MG SL tablet Place 0.4 mg under the tongue every 2 (two) hours as  needed for chest pain.     triamcinolone (KENALOG) 0.025 % cream Apply 1 application topically daily as needed (Rash).     atorvastatin (LIPITOR) 80 MG tablet Take 1 tablet (80 mg total) by mouth daily. 30 tablet 1   metoprolol succinate (TOPROL-XL) 25 MG 24 hr tablet Take 0.5 tablets (12.5 mg total) by mouth daily. (Patient taking differently: Take 25 mg by mouth daily.) 15 tablet 0   No current facility-administered medications for this visit.    OBJECTIVE: There were no vitals filed for this visit.   There is no  height or weight on file to calculate BMI.      General: Well-developed, well-nourished, no acute distress. Eyes: Pink conjunctiva, anicteric sclera. HEENT: Normocephalic, moist mucous membranes, clear oropharnyx. Lungs: Clear to auscultation bilaterally. Heart: Regular rate and rhythm. No rubs, murmurs, or gallops. Abdomen: Soft, nontender, nondistended. No organomegaly noted, normoactive bowel sounds. Musculoskeletal: No edema, cyanosis, or clubbing. Neuro: Alert, answering all questions appropriately. Cranial nerves grossly intact. Skin: No rashes or petechiae noted. Psych: Normal affect. Lymphatics: No cervical, calvicular, axillary or inguinal LAD.   LAB RESULTS:  Lab Results  Component Value Date   NA 141 08/13/2022   K 4.1 08/13/2022   CL 102 08/13/2022   CO2 28 08/13/2022   GLUCOSE 109 (H) 08/13/2022   BUN 32 (H) 08/13/2022   CREATININE 1.40 (H) 08/13/2022   CALCIUM 9.7 08/13/2022   PROT 6.4 08/13/2022   ALBUMIN 3.3 (L) 12/26/2020   AST 19 08/13/2022   ALT 19 08/13/2022   ALKPHOS 58 12/26/2020   BILITOT 0.5 08/13/2022   GFRNONAA 57 (L) 12/26/2020    Lab Results  Component Value Date   WBC 6.5 08/13/2022   NEUTROABS 4,433 08/13/2022   HGB 10.4 (L) 08/13/2022   HCT 32.6 (L) 08/13/2022   MCV 79.3 (L) 08/13/2022   PLT 342 08/13/2022    Lab Results  Component Value Date   TIBC 568 (H) 08/13/2022   FERRITIN 12 (L) 08/13/2022   FERRITIN 65  12/26/2020   FERRITIN 43 12/25/2020   IRONPCTSAT 11 (L) 08/13/2022   PATHOLOGY- 10/12/2016 COLON AND RECTUM: Resection, Including Transanal Disk Excision of Rectal Neoplasms  (Colon Res - All Specimens)    SPECIMEN     Procedure:    Other (specify): Extended right hemicolectomy   TUMOR     Primary Tumor Site:    Cannot be determined (explain): 2.8 cm distal to the anastomosis site     Histologic Type:    Adenocarcinoma     Histologic Grade:    Low-grade (well differentiated to moderately differentiated)     Tumor Size:    Greatest dimension (cm): 4.3 cm       Additional Dimension (cm):    3.9 cm       Additional Dimension (cm):    1.2 cm     Tumor Deposits:    Not identified       Site(s) of Direct Extent of Tumor:    None identified       Microscopic Tumor Extension:    Tumor invades through the muscularis propria into the subserosal adipose tissue or the nonperitonealized pericolic or perirectal soft tissues but does not extend to the serosal surface       Macroscopic Tumor Perforation:    Not Identified       Lymph-Vascular Invasion:    Not identified         Intratumoral Lymphocytic Response (tumor-infiltrating lymphocytes):    Marked (3 or more per high-power field)         Peritumor Lymphocytic Response (Crohn-like response):    Mild to moderate         Tumor Subtype and Differentiation:    Mucinous tumor component           Specify Percentage of Mucinous Tumor Component:    40       Perineural Invasion:    Not identified   MARGINS     Uninvolved Margins:    All margins uninvolved by invasive carcinoma     Margins:  For Resection Specimens Only       Proximal Margin:             Proximal Margin:    Uninvolved by invasive carcinoma           Distance of Tumor from Margin:    Specify (cm): 5.8 cm       Distal Margin:             Distal Margin:    Uninvolved by invasive carcinoma           Distance of Tumor from Margin (required only for rectal tumors):    Specify (cm): 17.2  cm       Circumferential (Radial) Margin:    Uninvolved by invasive carcinoma         Distance of Tumor from Margin (required only for rectal tumors):    Specify (cm): 0.4 cm       Mesenteric Margin:    Uninvolved by invasive carcinoma         Distance of Tumor from Margin:    Specify (cm): 2.8 cm   LYMPH NODES     Regional Lymph Nodes:           Number of Lymph Nodes Examined:    Specify number: 39       Number of Lymph Nodes Involved:    Specify number: 0   STAGE (pTNM, AJCC 7th ed.)     Primary Tumor (pT):    pT3: Tumor invades through the muscularis propria into pericolorectal tissues     Regional Lymph Nodes (pN):    pN0: No regional lymph node metastasis   ADDITIONAL FINDINGS     Additional Pathologic Findings:    Other (specify): Organizing fat necrosis   Comment    Per the established protocol, block A5 was selected for Lynch syndrome screening by [facility].  IHC stains performed at [facility] show the following:   MLH1:  Absent expression. PMS2:  Absent expression. MSH2:  Normal expression. MSH6:  Normal expression.   Based on these findings and the established protocol at [facility], an appropriate tumor block is selected and sent for BRAF mutation and MLH1 promoter methylation analyses.  When available, those results will be reported in an addendum.  Based on an established protocol at [facility], additional Lynch Syndrome testing was performed, with the following results:     BRAF mutation analysis: BRAF Mutation Detected Mutation(s) c. 1799T>A (p. V600E)   MLH1 hypermethylation analysis: MLH1 Methylation Detected MLH1 Methylation (%) 69.7   Based on these findings, the patient is NOT likely to have Lynch Syndrome, and based on current recommendations no further testing is indicated.  ASSESSMENT AND PLAN:   Karen Madden is a 76 y.o. female with pmh of CAD, HTN, hypothyroidism, OSA and colon adenocarcinoma pT3N0 status post resection on 10/12/2016 by Dr. Donalee Citrin at [place] did not  receive adjuvant treatment was seen for iron deficiency anemia.   # Microcytic anemia  # Iron deficiency  -Labs reviewed.  Recent hemoglobin 10.4 with ferritin of 12. -Unable to tolerate oral iron due to GI upset. -We will proceed with IV Feraheme 550 mg weekly x2 doses. -Patient has had bladder prolapse surgery twice and requesting for UA to be checked.  Will check for any microscopic hematuria. -Follow-up in 8 weeks for MD visit and labs prior.  # Remote history of Stage II colon cancer  -Diagnosed with stage II colon adenocarcinoma status postresection on 10/12/2026 at [facility].  Pathology  report attached above.  Did not require adjuvant treatment. -Last colonoscopy was in 2021 which showed internal hemorrhoids and was otherwise normal -Her PCP has already initiated GI referral.  She will need repeat colonoscopy and if no source of bleeding identified an endoscopy.  #CKD Stage 3b - reports using ibuprofen for right knee pain which is discontinued now.  - on diuretics managed by PCP.   -Check EPO level for next visit.  If low, can consider EPO injections.  Patient expressed understanding and was in agreement with this plan. She also understands that She can call clinic at any time with any questions, concerns, or complaints.   I spent a total of 35 minutes reviewing chart data, face-to-face evaluation with the patient, counseling and coordination of care as detailed above.  Jane Canary, MD   08/18/2022 10:21 AM

## 2022-08-19 ENCOUNTER — Other Ambulatory Visit: Payer: Self-pay

## 2022-08-19 ENCOUNTER — Telehealth: Payer: Self-pay

## 2022-08-19 DIAGNOSIS — Z85038 Personal history of other malignant neoplasm of large intestine: Secondary | ICD-10-CM

## 2022-08-19 DIAGNOSIS — D649 Anemia, unspecified: Secondary | ICD-10-CM

## 2022-08-19 MED ORDER — NA SULFATE-K SULFATE-MG SULF 17.5-3.13-1.6 GM/177ML PO SOLN: 1.0000 | Freq: Once | ORAL | 0 refills | Status: AC

## 2022-08-19 NOTE — Telephone Encounter (Signed)
Gastroenterology Pre-Procedure Review  Request Date: 09/16/22 Requesting Physician: Dr. Vicente Males  PATIENT REVIEW QUESTIONS: The patient responded to the following health history questions as indicated:    1. Are you having any GI issues? no, however patient has a history of colon cancer 2017, stated her PCP wanted her to have colonoscopy for IDA.  New patient appts are  being scheduled in January therefore moved forward with scheduling colonoscopy and will have Dr. Vicente Males advise further. 2. Do you have a personal history of Polyps? yes (personal history of colon cancer 2017, last colonoscopy 2020 no polyps present no chemo or radiation was required the colon cancer was contained in the wall ) 3. Do you have a family history of Colon Cancer or Polyps? yes (brother colon cancer at age 79 deceased) 77. Diabetes Mellitus? no 5. Joint replacements in the past 12 months? 2022 January heart attack stent was placed Dr. Clayborn Bigness is pts cardiology 6. Major health problems in the past 3 months?no 7. Any artificial heart valves, MVP, or defibrillator?no    MEDICATIONS & ALLERGIES:    Patient reports the following regarding taking any anticoagulation/antiplatelet therapy:   Plavix, Coumadin, Eliquis, Xarelto, Lovenox, Pradaxa, Brilinta, or Effient? yes (Plavix prescribed by Dr. Clayborn Bigness Cardiac/blood thinner clearance sent) Aspirin? yes (81 mg daily)  Patient confirms/reports the following medications:  Current Outpatient Medications  Medication Sig Dispense Refill   aspirin 81 MG chewable tablet Chew 81 mg by mouth daily.     atorvastatin (LIPITOR) 80 MG tablet Take 1 tablet (80 mg total) by mouth daily. 30 tablet 1   buPROPion (WELLBUTRIN XL) 300 MG 24 hr tablet Take 1 tablet (300 mg total) by mouth daily. 90 tablet 1   Cholecalciferol 125 MCG (5000 UT) TABS Take 5,000 Units by mouth daily.     clopidogrel (PLAVIX) 75 MG tablet Take 75 mg by mouth daily.     diphenhydrAMINE (SIMPLY SLEEP) 25 MG tablet Take  25 mg by mouth at bedtime as needed for sleep.     ezetimibe (ZETIA) 10 MG tablet Take 10 mg by mouth daily.     famotidine (PEPCID) 20 MG tablet Take 20 mg by mouth 2 (two) times daily.     furosemide (LASIX) 20 MG tablet Take 0.5 tablets (10 mg total) by mouth daily. 30 tablet 2   gabapentin (NEURONTIN) 400 MG capsule Take 1 capsule (400 mg total) by mouth at bedtime. 90 capsule 1   hydrochlorothiazide (HYDRODIURIL) 25 MG tablet Take 25 mg by mouth daily.     Iron, Ferrous Sulfate, 325 (65 Fe) MG TABS Take 325 mg by mouth daily. 30 tablet 1   KLOR-CON M20 20 MEQ tablet Take 20 mEq by mouth daily.     levothyroxine (SYNTHROID) 75 MCG tablet Take 75 mcg by mouth daily before breakfast.     loperamide (IMODIUM A-D) 2 MG tablet Take 2 mg by mouth 4 (four) times daily as needed for diarrhea or loose stools.     metoprolol succinate (TOPROL-XL) 25 MG 24 hr tablet Take 0.5 tablets (12.5 mg total) by mouth daily. (Patient taking differently: Take 25 mg by mouth daily.) 15 tablet 0   Multiple Vitamins-Minerals (PRESERVISION AREDS) TABS Take 1 tablet by mouth daily.     nitroGLYCERIN (NITROSTAT) 0.4 MG SL tablet Place 0.4 mg under the tongue every 2 (two) hours as needed for chest pain.     triamcinolone (KENALOG) 0.025 % cream Apply 1 application topically daily as needed (Rash).     No  current facility-administered medications for this visit.    Patient confirms/reports the following allergies:  Allergies  Allergen Reactions   Cyclobenzaprine Hives   Morphine And Related Anxiety    Hallucinations, nausea, vomiting, "doesn't help my pain at all"   Tetracyclines & Related Nausea And Vomiting    No orders of the defined types were placed in this encounter.   AUTHORIZATION INFORMATION Primary Insurance: 1D#: Group #:  Secondary Insurance: 1D#: Group #:  SCHEDULE INFORMATION: Date: 09/16/22 Time: Location: [facility]

## 2022-08-20 NOTE — Telephone Encounter (Signed)
Needs office visit- can she come in next week ? Wednesday after last endo

## 2022-08-26 ENCOUNTER — Telehealth: Payer: Self-pay

## 2022-08-26 MED ORDER — NA SULFATE-K SULFATE-MG SULF 17.5-3.13-1.6 GM/177ML PO SOLN: 1.0000 | Freq: Once | ORAL | 0 refills | Status: AC

## 2022-08-26 NOTE — Telephone Encounter (Signed)
Blood thinner advice has been received from Dr. Etta Quill office.  Patient has been advised to hold Eliquis 5 days prior to colonoscopy.  Restart 2 days after colonoscopy.  Rx for Suprep sent to [facility] in [place] on 08/19/22.  Follow up call to pharmacy has been made patient stated she did not receive it.  [facility] and are working on some other rx for her as well.  They will notify her when everything is ready.  Thanks, [place], [place]

## 2022-08-31 ENCOUNTER — Inpatient Hospital Stay: Payer: PPO | Attending: Internal Medicine

## 2022-08-31 VITALS — BP 133/71 | HR 65 | Temp 97.5°F | Resp 18

## 2022-08-31 DIAGNOSIS — D509 Iron deficiency anemia, unspecified: Secondary | ICD-10-CM | POA: Insufficient documentation

## 2022-08-31 DIAGNOSIS — Z79899 Other long term (current) drug therapy: Secondary | ICD-10-CM | POA: Diagnosis not present

## 2022-08-31 MED ORDER — SODIUM CHLORIDE 0.9 % IV SOLN
510.0000 mg | Freq: Once | INTRAVENOUS | Status: AC
  Administered 2022-08-31: 510 mg via INTRAVENOUS
  Filled 2022-08-31: qty 510

## 2022-08-31 MED ORDER — SODIUM CHLORIDE 0.9 % IV SOLN
Freq: Once | INTRAVENOUS | Status: AC
  Filled 2022-08-31: qty 250

## 2022-09-01 ENCOUNTER — Other Ambulatory Visit: Payer: Self-pay

## 2022-09-01 ENCOUNTER — Ambulatory Visit (INDEPENDENT_AMBULATORY_CARE_PROVIDER_SITE_OTHER): Payer: PPO | Admitting: Gastroenterology

## 2022-09-01 DIAGNOSIS — H269 Unspecified cataract: Secondary | ICD-10-CM | POA: Insufficient documentation

## 2022-09-01 DIAGNOSIS — D508 Other iron deficiency anemias: Secondary | ICD-10-CM | POA: Diagnosis not present

## 2022-09-01 DIAGNOSIS — Z85038 Personal history of other malignant neoplasm of large intestine: Secondary | ICD-10-CM

## 2022-09-01 MED ORDER — NA SULFATE-K SULFATE-MG SULF 17.5-3.13-1.6 GM/177ML PO SOLN: 354.0000 mL | Freq: Once | ORAL | 0 refills | Status: AC

## 2022-09-01 NOTE — Progress Notes (Signed)
Jonathon Bellows MD, MRCP(U.K) [address]  [place]  [place], [place] [address]  Main: [phone]  Fax: [phone]   Gastroenterology Consultation  Referring Provider:     Teodora Medici, DO Primary Care Physician:  Teodora Medici, DO Primary Gastroenterologist:  Dr. Jonathon Bellows  Reason for Consultation:   Iron deficiency anemia         HPI:   Karen Madden is a 76 y.o. y/o female referred for consultation & management  by Dr. Teodora Medici, DO.   Here today to see me for iron deficiency anemia Rectal bleeding: No Nose bleeds: No Vaginal bleeding : Bowel Hematemesis or hemoptysis : No Blood in urine : None Hemoglobin 11.1 g in July 2022. She has a history of colon cancer post right hemicolectomy.  Still apparently had last colonoscopy back in 2021.  Last colonoscopy report on care everywhere shows internal hemorrhoids otherwise normal.  No change in bowel movements.  Has been taking Advil on a regular basis for left knee issues.  Stopped recently.   Iron/TIBC/Ferritin/ %Sat    Component Value Date/Time   IRON 62 08/13/2022 1058   TIBC 568 (H) 08/13/2022 1058   FERRITIN 12 (L) 08/13/2022 1058   IRONPCTSAT 11 (L) 08/13/2022 1058      Latest Ref Rng & Units 08/13/2022   10:58 AM 12/23/2020   12:23 AM 12/22/2020    3:21 PM  CBC  WBC 3.8 - 10.8 Thousand/uL 6.5  6.8  6.2   Hemoglobin 11.7 - 15.5 g/dL 10.4  11.3  10.8   Hematocrit 35.0 - 45.0 % 32.6  34.0  33.6   Platelets 140 - 400 Thousand/uL 342  282  299      Past Medical History:  Diagnosis Date   Allergy Na   Anginal pain ([place])    Arthritis NA   Cancer ([place]) 2017   Cataract 2005 & 2007   CHF (congestive heart failure) (HCC)    Coronary artery disease    Dyspnea    GERD (gastroesophageal reflux disease)    Hypertension    Hypothyroidism    Iron deficiency anemia 08/18/2022   Myocardial infarction ([place]) 12/22/20   Sleep apnea 2023    Past Surgical History:  Procedure Laterality Date    ABDOMINAL HYSTERECTOMY  1991   APPENDECTOMY  2016   BLADDER SURGERY     2011   BUNIONECTOMY     CATARACT EXTRACTION Bilateral    CHOLECYSTECTOMY  2016   COLON SURGERY  2016 & 2017   CORONARY/GRAFT ACUTE MI REVASCULARIZATION N/A 12/22/2020   Procedure: Coronary/Graft Acute MI Revascularization;  Surgeon: Yolonda Kida, MD;  Location: [facility];  Service: Cardiovascular;  Laterality: N/A;   HERNIA REPAIR  2021   LEFT HEART CATH AND CORONARY ANGIOGRAPHY N/A 12/22/2020   Procedure: LEFT HEART CATH AND CORONARY ANGIOGRAPHY;  Surgeon: Yolonda Kida, MD;  Location: [facility];  Service: Cardiovascular;  Laterality: N/A;   LEFT HEART CATH AND CORONARY ANGIOGRAPHY N/A 07/21/2021   Procedure: LEFT HEART CATH AND CORONARY ANGIOGRAPHY with possible Intervention;  Surgeon: Yolonda Kida, MD;  Location: [facility];  Service: Cardiovascular;  Laterality: N/A;    Prior to Admission medications   Medication Sig Start Date End Date Taking? Authorizing Provider  aspirin 81 MG chewable tablet Chew 81 mg by mouth daily.    [provider]  atorvastatin (LIPITOR) 80 MG tablet Take 1 tablet (80 mg total) by mouth daily.  12/27/20 07/21/21  Nolberto Hanlon, MD  buPROPion (WELLBUTRIN XL) 300 MG 24 hr tablet Take 1 tablet (300 mg total) by mouth daily. 08/13/22   Teodora Medici, DO  Cholecalciferol 125 MCG (5000 UT) TABS Take 5,000 Units by mouth daily.    [provider]  clopidogrel (PLAVIX) 75 MG tablet Take 75 mg by mouth daily. 05/09/21   [provider]  diphenhydrAMINE (SIMPLY SLEEP) 25 MG tablet Take 25 mg by mouth at bedtime as needed for sleep.    [provider]  ezetimibe (ZETIA) 10 MG tablet Take 10 mg by mouth daily.    [provider]  famotidine (PEPCID) 20 MG tablet Take 20 mg by mouth 2 (two) times daily.    [provider]  furosemide (LASIX) 20 MG tablet Take 0.5 tablets (10 mg total) by mouth daily.  08/17/22   Teodora Medici, DO  gabapentin (NEURONTIN) 400 MG capsule Take 1 capsule (400 mg total) by mouth at bedtime. 08/13/22   Teodora Medici, DO  hydrochlorothiazide (HYDRODIURIL) 25 MG tablet Take 25 mg by mouth daily. 07/08/21   [provider]  KLOR-CON M20 20 MEQ tablet Take 20 mEq by mouth daily. 07/04/21   [provider]  levothyroxine (SYNTHROID) 75 MCG tablet Take 75 mcg by mouth daily before breakfast.    [provider]  loperamide (IMODIUM A-D) 2 MG tablet Take 2 mg by mouth 4 (four) times daily as needed for diarrhea or loose stools.    [provider]  metoprolol succinate (TOPROL-XL) 25 MG 24 hr tablet Take 0.5 tablets (12.5 mg total) by mouth daily. Patient taking differently: Take 25 mg by mouth daily. 12/27/20 07/21/21  Nolberto Hanlon, MD  Multiple Vitamins-Minerals (PRESERVISION AREDS) TABS Take 1 tablet by mouth daily.    [provider]  nitroGLYCERIN (NITROSTAT) 0.4 MG SL tablet Place 0.4 mg under the tongue every 2 (two) hours as needed for chest pain. 04/25/21   [provider]  triamcinolone (KENALOG) 0.025 % cream Apply 1 application topically daily as needed (Rash).    [provider]    Family History  Problem Relation Age of Onset   Heart disease Mother    Hypertension Mother    Obesity Mother    Vision loss Mother    Alcohol abuse Father    Cancer Brother    Diabetes Sister    Varicose Veins Daughter      Social History   Tobacco Use   Smoking status: Never   Smokeless tobacco: Never  Vaping Use   Vaping Use: Never used  Substance Use Topics   Alcohol use: Not Currently   Drug use: Never    Allergies as of 09/01/2022 - Review Complete 08/19/2022  Allergen Reaction Noted   Cyclobenzaprine Hives 04/08/2015   Morphine and related Anxiety 12/22/2020   Tetracyclines & related Nausea And Vomiting 02/20/2014    Review of Systems:    All systems reviewed and negative except where noted  in HPI.   Physical Exam:  There were no vitals taken for this visit. No LMP recorded. Patient has had a hysterectomy. Psych:  Alert and cooperative. Normal mood and affect. General:   Alert,  Well-developed, well-nourished, pleasant and cooperative in NAD Head:  Normocephalic and atraumatic. Eyes:  Sclera clear, no icterus.   Conjunctiva pink. Ears:  Normal auditory acuity.   Neurologic:  Alert and oriented x3;  grossly normal neurologically. Psych:  Alert and cooperative. Normal mood and affect.  Imaging Studies: No  results found.  Assessment and Plan:   Karen Madden is a 76 y.o. y/o female has been referred for iron deficiency anemia.  Status post right hemicolectomy for colon cancer in 2017.  Recent colonoscopy 2021 was normal.  Anemia has worsened since 1 year.  Ferritin very low suggestive of iron deficiency.  Very likely etiology due to chronic Advil use for knee pain which she has stopped.  Receiving IV iron presently.   Plan  Check , b12,folate,celiac serology and urine . EGD+colonoscopy and if negative capsule study of the small bowel  Plavix holding instructions     I have discussed alternative options, risks & benefits,  which include, but are not limited to, bleeding, infection, perforation,respiratory complication & drug reaction.  The patient agrees with this plan & written consent will be obtained.     Follow up in 8-12 weeks  Dr Jonathon Bellows MD,MRCP(U.K)

## 2022-09-01 NOTE — Addendum Note (Signed)
Addended by: Wayna Chalet on: 09/01/2022 03:27 PM   Modules accepted: Orders

## 2022-09-02 ENCOUNTER — Other Ambulatory Visit: Payer: Self-pay | Admitting: Internal Medicine

## 2022-09-02 DIAGNOSIS — E538 Deficiency of other specified B group vitamins: Secondary | ICD-10-CM

## 2022-09-02 MED ORDER — VITAMIN B-12 1000 MCG PO TABS: 1000.0000 ug | ORAL_TABLET | Freq: Every day | ORAL | 1 refills | Status: DC

## 2022-09-02 NOTE — Progress Notes (Signed)
Karen Madden inform B12 very low suggest 1055mg tablet once daily - send results to PCP to follow up on

## 2022-09-04 LAB — CELIAC DISEASE AB SCREEN W/RFX
Antigliadin Abs, IgA: 3 units (ref 0–19)
IgA/Immunoglobulin A, Serum: 39 mg/dL — ABNORMAL LOW (ref 64–422)
Transglutaminase IgA: <2 U/mL (ref 0–3)

## 2022-09-04 LAB — URINALYSIS
Bilirubin, UA: NEGATIVE
Glucose, UA: NEGATIVE
Ketones, UA: NEGATIVE
Nitrite, UA: NEGATIVE
Protein,UA: NEGATIVE
RBC, UA: NEGATIVE
Specific Gravity, UA: 1.017 (ref 1.005–1.030)
Urobilinogen, Ur: 0.2 mg/dL (ref 0.2–1.0)
pH, UA: 6 (ref 5.0–7.5)

## 2022-09-04 LAB — B12 AND FOLATE PANEL
Folate: 12.5 ng/mL (ref >3.0)
Vitamin B-12: 219 pg/mL — ABNORMAL LOW (ref 232–1245)

## 2022-09-05 LAB — H. PYLORI BREATH TEST: H pylori Breath Test: POSITIVE — AB

## 2022-09-07 ENCOUNTER — Inpatient Hospital Stay: Payer: PPO

## 2022-09-07 VITALS — BP 157/77 | HR 69 | Temp 98.1°F | Resp 18

## 2022-09-07 DIAGNOSIS — D509 Iron deficiency anemia, unspecified: Secondary | ICD-10-CM | POA: Diagnosis not present

## 2022-09-07 MED ORDER — SODIUM CHLORIDE 0.9 % IV SOLN
510.0000 mg | Freq: Once | INTRAVENOUS | Status: AC
  Administered 2022-09-07: 510 mg via INTRAVENOUS
  Filled 2022-09-07: qty 510

## 2022-09-07 MED ORDER — SODIUM CHLORIDE 0.9 % IV SOLN
Freq: Once | INTRAVENOUS | Status: AC
  Filled 2022-09-07: qty 250

## 2022-09-07 NOTE — Progress Notes (Signed)
Inform H pylori positive   Suggest clarithromycin 500 mg PO BID, amoxicillin 1 gram BID omeprazole 20 mg BID all for 14 days.   need repeat H pylori stool antigen to check for eradication after .

## 2022-09-07 NOTE — Patient Instructions (Addendum)
[facility] AT [place]  Discharge Instructions: Thank you for choosing [facility] to provide your oncology and hematology care.  If you have a lab appointment with the [facility], please go directly to the [facility] and check in at the registration area.  Wear comfortable clothing and clothing appropriate for easy access to any Portacath or PICC line.   We strive to give you quality time with your provider. You may need to reschedule your appointment if you arrive late (15 or more minutes).  Arriving late affects you and other patients whose appointments are after yours.  Also, if you miss three or more appointments without notifying the office, you may be dismissed from the clinic at the provider's discretion.      For prescription refill requests, have your pharmacy contact our office and allow 72 hours for refills to be completed.    Today you received the following chemotherapy and/or immunotherapy agents Feraheme      To help prevent nausea and vomiting after your treatment, we encourage you to take your nausea medication as directed.  BELOW ARE SYMPTOMS THAT SHOULD BE REPORTED IMMEDIATELY: *FEVER GREATER THAN 100.4 F (38 C) OR HIGHER *CHILLS OR SWEATING *NAUSEA AND VOMITING THAT IS NOT CONTROLLED WITH YOUR NAUSEA MEDICATION *UNUSUAL SHORTNESS OF BREATH *UNUSUAL BRUISING OR BLEEDING *URINARY PROBLEMS (pain or burning when urinating, or frequent urination) *BOWEL PROBLEMS (unusual diarrhea, constipation, pain near the anus) TENDERNESS IN MOUTH AND THROAT WITH OR WITHOUT PRESENCE OF ULCERS (sore throat, sores in mouth, or a toothache) UNUSUAL RASH, SWELLING OR PAIN  UNUSUAL VAGINAL DISCHARGE OR ITCHING   Items with * indicate a potential emergency and should be followed up as soon as possible or go to the Emergency Department if any problems should occur.  Please show the CHEMOTHERAPY ALERT CARD or IMMUNOTHERAPY ALERT CARD at check-in to  the Emergency Department and triage nurse.  Should you have questions after your visit or need to cancel or reschedule your appointment, please contact [facility] [place] [facility] [place]  [phone] and follow the prompts.  Office hours are 8:00 a.m. to 4:30 p.m. Monday - Friday. Please note that voicemails left after 4:00 p.m. may not be returned until the following business day.  We are closed weekends and major holidays. You have access to a nurse at all times for urgent questions. Please call the main number to the clinic [phone] and follow the prompts.  For any non-urgent questions, you may also contact your provider using MyChart. We now offer e-Visits for anyone 20 and older to request care online for non-urgent symptoms. For details visit [email or web address].   Also download the MyChart app! Go to the app store, search "MyChart", open the app, select [facility], and log in with your MyChart username and password.  Masks are optional in the cancer centers. If you would like for your care team to wear a mask while they are taking care of you, please let them know. For doctor visits, patients may have with them one support person who is at least 76 years old. At this time, visitors are not allowed in the infusion area.  Ferumoxytol Injection What is this medication? FERUMOXYTOL (FER ue MOX i tol) treats low levels of iron in your body (iron deficiency anemia). Iron is a mineral that plays an important role in making red blood cells, which carry oxygen from your lungs to the rest of your body. This medicine may be used for other  purposes; ask your health care provider or pharmacist if you have questions. COMMON BRAND NAME(S): Feraheme What should I tell my care team before I take this medication? They need to know if you have any of these conditions: Anemia not caused by low iron levels High levels of iron in the blood Magnetic resonance imaging (MRI) test scheduled An  unusual or allergic reaction to iron, other medications, foods, dyes, or preservatives Pregnant or trying to get pregnant Breast-feeding How should I use this medication? This medication is for injection into a vein. It is given in a hospital or clinic setting. Talk to your care team the use of this medication in children. Special care may be needed. Overdosage: If you think you have taken too much of this medicine contact a poison control center or emergency room at once. NOTE: This medicine is only for you. Do not share this medicine with others. What if I miss a dose? It is important not to miss your dose. Call your care team if you are unable to keep an appointment. What may interact with this medication? Other iron products This list may not describe all possible interactions. Give your health care provider a list of all the medicines, herbs, non-prescription drugs, or dietary supplements you use. Also tell them if you smoke, drink alcohol, or use illegal drugs. Some items may interact with your medicine. What should I watch for while using this medication? Visit your care team regularly. Tell your care team if your symptoms do not start to get better or if they get worse. You may need blood work done while you are taking this medication. You may need to follow a special diet. Talk to your care team. Foods that contain iron include: whole grains/cereals, dried fruits, beans, or peas, leafy green vegetables, and organ meats (liver, kidney). What side effects may I notice from receiving this medication? Side effects that you should report to your care team as soon as possible: Allergic reactions--skin rash, itching, hives, swelling of the face, lips, tongue, or throat Low blood pressure--dizziness, feeling faint or lightheaded, blurry vision Shortness of breath Side effects that usually do not require medical attention (report to your care team if they continue or are  bothersome): Flushing Headache Joint pain Muscle pain Nausea Pain, redness, or irritation at injection site This list may not describe all possible side effects. Call your doctor for medical advice about side effects. You may report side effects to FDA at 1-800-FDA-1088. Where should I keep my medication? This medication is given in a hospital or clinic and will not be stored at home. NOTE: This sheet is a summary. It may not cover all possible information. If you have questions about this medicine, talk to your doctor, pharmacist, or health care provider.  2023 Elsevier/Gold Standard (2021-05-02 00:00:00)

## 2022-09-08 ENCOUNTER — Encounter: Payer: Self-pay | Admitting: Internal Medicine

## 2022-09-08 ENCOUNTER — Ambulatory Visit (INDEPENDENT_AMBULATORY_CARE_PROVIDER_SITE_OTHER): Payer: PPO | Admitting: Internal Medicine

## 2022-09-08 VITALS — BP 132/84 | HR 73 | Temp 98.3°F | Resp 18 | Ht 64.0 in | Wt 173.6 lb

## 2022-09-08 DIAGNOSIS — D649 Anemia, unspecified: Secondary | ICD-10-CM | POA: Diagnosis not present

## 2022-09-08 DIAGNOSIS — E039 Hypothyroidism, unspecified: Secondary | ICD-10-CM

## 2022-09-08 DIAGNOSIS — I5032 Chronic diastolic (congestive) heart failure: Secondary | ICD-10-CM

## 2022-09-08 DIAGNOSIS — I10 Essential (primary) hypertension: Secondary | ICD-10-CM | POA: Diagnosis not present

## 2022-09-08 DIAGNOSIS — F330 Major depressive disorder, recurrent, mild: Secondary | ICD-10-CM

## 2022-09-08 DIAGNOSIS — Z8619 Personal history of other infectious and parasitic diseases: Secondary | ICD-10-CM

## 2022-09-08 MED ORDER — AMOXICILLIN 500 MG PO TABS: 500.0000 mg | ORAL_TABLET | Freq: Two times a day (BID) | ORAL | 0 refills | Status: AC

## 2022-09-08 MED ORDER — OMEPRAZOLE 20 MG PO CPDR: 20.0000 mg | DELAYED_RELEASE_CAPSULE | Freq: Two times a day (BID) | ORAL | 0 refills | Status: DC

## 2022-09-08 MED ORDER — CLARITHROMYCIN 500 MG PO TABS: 500.0000 mg | ORAL_TABLET | Freq: Two times a day (BID) | ORAL | 0 refills | Status: AC

## 2022-09-08 NOTE — Progress Notes (Signed)
Established Patient Office Visit  Subjective    Patient ID: Karen Madden, female    DOB: 08-04-1946  Age: 76 y.o. MRN: [id number]  CC:  Chief Complaint  Patient presents with   Follow-up    HPI Karen Madden presents to follow up.   Hx of STEMI/HTN/OSA/CHFpEF: -Medications: Lasix 20 mg, HCTZ 25 mg, KCl 20 meQ, Metoprolol 25 mg, nitroglycerin PRN -Patient is compliant with above medications and reports no side effects. -Checking BP at home (average): 120-130/80 -Denies any SOB, CP, vision changes, LE edema or symptoms of hypotension -Diet: Working on diet, has gained 10 pounds -Most recent echo 6/22 EF >55% -Hx of STEMI 12/22/20 - 1 stent in LAD, following with Cardiology currently, last seen 04/14/22. Planning on seeing later this week  -Most recent cath 08/09/21 showing widely patent stent in LAD, EF 60% -Working on CPAP right now for OSA  CAD/HLD: -Medications: aspirin 81, Plavix 75 mg, Lipitor 80 mg (held for 90 days per Cardiology recommendation to see if fatigue resolves, uncertain if she did this), Zetia 10 mg  -Patient is compliant with above medications and reports no side effects.  -Last lipid panel: Lipid Panel     Component Value Date/Time   CHOL 191 12/22/2020 1521   TRIG 144 12/22/2020 1521   HDL 70 12/22/2020 1521   CHOLHDL 2.7 12/22/2020 1521   VLDL 29 12/22/2020 1521   LDLCALC 92 12/22/2020 1521   Hypothyroidism: -Medications: Levothyroxine 75 mcg  -Patient is compliant with the above medication (s) at the above dose and reports no medication side effects.  -Denies weight changes, cold./heat intolerance, skin changes, anxiety/palpitations  -Last TSH: 2.13 8/23  GERD: -Currently using Pepcid 20 mg over the counter PRN  Hx of Colon Cancer:  -First diagnosed in 2016 - had a large polyp on cecum. Then hgb was low, passed out. Had a second tumor removed in 2017. Had a partial colectomy.  -Last CBC showing hgb 10.3 in 2/23 -Following with GI - planning on  EGD and repeat colonoscopy and if negative a capsule study of the small bowel. Last by GI on 09/01/22. H. Pylori positive, about to start treatment.   MDD: -Mood status: stable -Current treatment: Wellbutrin 300 mg  -Satisfied with current treatment?: yes -Symptom severity: mild  -Duration of current treatment : years -Side effects: no Medication compliance: excellent compliance     09/08/2022    3:30 PM 08/13/2022    9:40 AM 03/31/2021    2:21 PM 01/22/2021   12:44 PM  Depression screen PHQ 2/9  Decreased Interest 0 0 2 0  Down, Depressed, Hopeless 0 0 2 0  PHQ - 2 Score 0 0 4 0  Altered sleeping 0 0 2 1  Tired, decreased energy 0 0 2 1  Change in appetite 0 0 1 0  Feeling bad or failure about yourself  0 0 0 0  Trouble concentrating 0 0 0 0  Moving slowly or fidgety/restless 0 0 0 0  Suicidal thoughts 0 0 0 0  PHQ-9 Score 0 0 9 2  Difficult doing work/chores Not difficult at all Not difficult at all Somewhat difficult Not difficult at all   Health Maintenance: -Blood work UTD -Mammogram 3/23 Birads-1  Outpatient Encounter Medications as of 09/08/2022  Medication Sig   amoxicillin (AMOXIL) 500 MG tablet Take 1 tablet (500 mg total) by mouth 2 (two) times daily for 14 days.   aspirin 81 MG chewable tablet Chew 81 mg by mouth  daily.   atorvastatin (LIPITOR) 10 MG tablet    buPROPion (WELLBUTRIN XL) 300 MG 24 hr tablet Take 1 tablet (300 mg total) by mouth daily.   Cholecalciferol 125 MCG (5000 UT) TABS Take 5,000 Units by mouth daily.   clarithromycin (BIAXIN) 500 MG tablet Take 1 tablet (500 mg total) by mouth 2 (two) times daily for 14 days.   clopidogrel (PLAVIX) 75 MG tablet Take 75 mg by mouth daily.   cyanocobalamin (VITAMIN B12) 1000 MCG tablet Take 1 tablet (1,000 mcg total) by mouth daily.   diphenhydrAMINE (SIMPLY SLEEP) 25 MG tablet Take 25 mg by mouth at bedtime as needed for sleep.   ezetimibe (ZETIA) 10 MG tablet Take 10 mg by mouth daily.   famotidine (PEPCID) 20  MG tablet Take 20 mg by mouth 2 (two) times daily.   furosemide (LASIX) 20 MG tablet Take 0.5 tablets (10 mg total) by mouth daily.   gabapentin (NEURONTIN) 400 MG capsule Take 1 capsule (400 mg total) by mouth at bedtime.   hydrochlorothiazide (HYDRODIURIL) 25 MG tablet Take 25 mg by mouth daily.   KLOR-CON M20 20 MEQ tablet Take 20 mEq by mouth daily.   levothyroxine (SYNTHROID) 75 MCG tablet Take 75 mcg by mouth daily before breakfast.   loperamide (IMODIUM A-D) 2 MG tablet Take 2 mg by mouth 4 (four) times daily as needed for diarrhea or loose stools.   Multiple Vitamins-Minerals (PRESERVISION AREDS) TABS Take 1 tablet by mouth daily.   nitroGLYCERIN (NITROSTAT) 0.4 MG SL tablet Place 0.4 mg under the tongue every 2 (two) hours as needed for chest pain.   omeprazole (PRILOSEC) 20 MG capsule Take 1 capsule (20 mg total) by mouth 2 (two) times daily before a meal for 14 days.   triamcinolone (KENALOG) 0.025 % cream Apply 1 application topically daily as needed (Rash).   metoprolol succinate (TOPROL-XL) 25 MG 24 hr tablet Take 0.5 tablets (12.5 mg total) by mouth daily. (Patient taking differently: Take 25 mg by mouth daily.)   No facility-administered encounter medications on file as of 09/08/2022.    Past Medical History:  Diagnosis Date   Allergy Na   Anginal pain ([place])    Arthritis NA   Cancer ([place]) 2017   Cataract 2005 & 2007   CHF (congestive heart failure) (HCC)    Coronary artery disease    Dyspnea    GERD (gastroesophageal reflux disease)    Hypertension    Hypothyroidism    Iron deficiency anemia 08/18/2022   Myocardial infarction ([place]) 12/22/20   Sleep apnea 2023    Past Surgical History:  Procedure Laterality Date   ABDOMINAL HYSTERECTOMY  1991   APPENDECTOMY  2016   BLADDER SURGERY     2011   BUNIONECTOMY     CATARACT EXTRACTION Bilateral    CHOLECYSTECTOMY  2016   COLON SURGERY  2016 & 2017   CORONARY/GRAFT ACUTE MI REVASCULARIZATION N/A 12/22/2020   Procedure:  Coronary/Graft Acute MI Revascularization;  Surgeon: Yolonda Kida, MD;  Location: [facility];  Service: Cardiovascular;  Laterality: N/A;   HERNIA REPAIR  2021   LEFT HEART CATH AND CORONARY ANGIOGRAPHY N/A 12/22/2020   Procedure: LEFT HEART CATH AND CORONARY ANGIOGRAPHY;  Surgeon: Yolonda Kida, MD;  Location: [facility];  Service: Cardiovascular;  Laterality: N/A;   LEFT HEART CATH AND CORONARY ANGIOGRAPHY N/A 07/21/2021   Procedure: LEFT HEART CATH AND CORONARY ANGIOGRAPHY with possible Intervention;  Surgeon: Yolonda Kida, MD;  Location: [facility];  Service: Cardiovascular;  Laterality: N/A;    Family History  Problem Relation Age of Onset   Heart disease Mother    Hypertension Mother    Obesity Mother    Vision loss Mother    Alcohol abuse Father    Cancer Brother    Diabetes Sister    Varicose Veins Daughter     Social History   Socioeconomic History   Marital status: Married    Spouse name: Not on file   Number of children: Not on file   Years of education: Not on file   Highest education level: Not on file  Occupational History   Not on file  Tobacco Use   Smoking status: Never   Smokeless tobacco: Never  Vaping Use   Vaping Use: Never used  Substance and Sexual Activity   Alcohol use: Not Currently   Drug use: Never   Sexual activity: Yes  Other Topics Concern   Not on file  Social History Narrative   Not on file   Social Determinants of Health   Financial Resource Strain: Not on file  Food Insecurity: Not on file  Transportation Needs: Not on file  Physical Activity: Not on file  Stress: Not on file  Social Connections: Not on file  Intimate Partner Violence: Not on file    Review of Systems  Constitutional:  Negative for chills and fever.  Eyes:  Negative for blurred vision.  Respiratory:  Negative for cough, shortness of breath and wheezing.   Cardiovascular:  Negative for chest pain and  palpitations.  Gastrointestinal:  Positive for abdominal pain. Negative for blood in stool, heartburn, melena, nausea and vomiting.  Neurological:  Negative for dizziness.      Objective    BP 132/84   Pulse 73   Temp 98.3 F (36.8 C)   Resp 18   Ht '5\' 4"'$  (1.626 m)   Wt 173 lb 9.6 oz (78.7 kg)   SpO2 97%   BMI 29.80 kg/m   Physical Exam Constitutional:      Appearance: Normal appearance.  HENT:     Head: Normocephalic and atraumatic.  Eyes:     Conjunctiva/sclera: Conjunctivae normal.  Cardiovascular:     Rate and Rhythm: Normal rate and regular rhythm.  Pulmonary:     Effort: Pulmonary effort is normal.     Breath sounds: Normal breath sounds.  Musculoskeletal:     Right lower leg: No edema.     Left lower leg: No edema.  Skin:    General: Skin is warm and dry.  Neurological:     General: No focal deficit present.     Mental Status: She is alert. Mental status is at baseline.  Psychiatric:        Mood and Affect: Mood normal.        Behavior: Behavior normal.       Assessment & Plan:   1. Primary hypertension/Chronic heart failure with preserved ejection fraction [facility]): Blood pressure stable, euvolemic on exam. Continue Lasix 20 mg, HCTZ 25 mg, KCl 20 meQ, Metoprolol 25 mg. Following with Cardiology later this week. Follow up here in about 4 months.   2. Hypothyroidism, unspecified type: TSH stable on labs, continue Levothyroxine 75 mcg.   3. Anemia, unspecified type: Vitamin B12 low, now on supplements. Following with GI, being treated for H pylori.   4. Mild episode of recurrent major depressive disorder ([place]): Stable, continue Wellbutrin 300 mg.  Return in about 4 months (around 01/08/2023).   Teodora Medici, DO

## 2022-09-08 NOTE — Patient Instructions (Addendum)
It was great seeing you today!  Plan discussed at today's visit: -No changes to medications today -Consider up dated pneumonia vaccine   Follow up in: 4 months   Take care and let us know if you have any questions or concerns prior to your next visit.  Dr. Rosana Berger

## 2022-09-08 NOTE — Addendum Note (Signed)
Addended by: Wayna Chalet on: 09/08/2022 11:19 AM   Modules accepted: Orders

## 2022-09-11 ENCOUNTER — Encounter: Payer: Self-pay | Admitting: Gastroenterology

## 2022-09-16 ENCOUNTER — Ambulatory Visit: Admission: RE | Admit: 2022-09-16 | Payer: PPO | Source: Home / Self Care | Admitting: Gastroenterology

## 2022-09-16 ENCOUNTER — Encounter: Admission: RE | Payer: Self-pay | Source: Home / Self Care

## 2022-09-16 SURGERY — COLONOSCOPY WITH PROPOFOL
Anesthesia: General

## 2022-09-18 ENCOUNTER — Encounter: Payer: Self-pay | Admitting: Internal Medicine

## 2022-09-21 ENCOUNTER — Other Ambulatory Visit: Payer: Self-pay

## 2022-09-21 MED ORDER — ONDANSETRON 4 MG PO TBDP: 4.0000 mg | ORAL_TABLET | Freq: Three times a day (TID) | ORAL | 0 refills | Status: DC | PRN

## 2022-09-21 NOTE — Telephone Encounter (Signed)
Yes can give her zofran odt 4 mg prn for nausea TID - 16 tablets with no refills

## 2022-10-12 ENCOUNTER — Inpatient Hospital Stay: Payer: PPO | Attending: Internal Medicine

## 2022-10-12 DIAGNOSIS — Z79899 Other long term (current) drug therapy: Secondary | ICD-10-CM | POA: Diagnosis not present

## 2022-10-12 DIAGNOSIS — E538 Deficiency of other specified B group vitamins: Secondary | ICD-10-CM | POA: Insufficient documentation

## 2022-10-12 DIAGNOSIS — I110 Hypertensive heart disease with heart failure: Secondary | ICD-10-CM | POA: Diagnosis not present

## 2022-10-12 DIAGNOSIS — Z7982 Long term (current) use of aspirin: Secondary | ICD-10-CM | POA: Insufficient documentation

## 2022-10-12 DIAGNOSIS — Z7902 Long term (current) use of antithrombotics/antiplatelets: Secondary | ICD-10-CM | POA: Insufficient documentation

## 2022-10-12 DIAGNOSIS — I252 Old myocardial infarction: Secondary | ICD-10-CM | POA: Diagnosis not present

## 2022-10-12 DIAGNOSIS — K219 Gastro-esophageal reflux disease without esophagitis: Secondary | ICD-10-CM | POA: Insufficient documentation

## 2022-10-12 DIAGNOSIS — Z79890 Hormone replacement therapy (postmenopausal): Secondary | ICD-10-CM | POA: Diagnosis not present

## 2022-10-12 DIAGNOSIS — E039 Hypothyroidism, unspecified: Secondary | ICD-10-CM | POA: Insufficient documentation

## 2022-10-12 DIAGNOSIS — G4730 Sleep apnea, unspecified: Secondary | ICD-10-CM | POA: Diagnosis not present

## 2022-10-12 DIAGNOSIS — N1832 Chronic kidney disease, stage 3b: Secondary | ICD-10-CM

## 2022-10-12 DIAGNOSIS — I2510 Atherosclerotic heart disease of native coronary artery without angina pectoris: Secondary | ICD-10-CM | POA: Diagnosis not present

## 2022-10-12 DIAGNOSIS — D509 Iron deficiency anemia, unspecified: Secondary | ICD-10-CM | POA: Diagnosis not present

## 2022-10-12 DIAGNOSIS — I509 Heart failure, unspecified: Secondary | ICD-10-CM | POA: Insufficient documentation

## 2022-10-12 DIAGNOSIS — G4733 Obstructive sleep apnea (adult) (pediatric): Secondary | ICD-10-CM | POA: Diagnosis not present

## 2022-10-12 LAB — CBC WITH DIFFERENTIAL/PLATELET
Abs Immature Granulocytes: 0.07 10*3/uL (ref 0.00–0.07)
Basophils Absolute: 0 10*3/uL (ref 0.0–0.1)
Basophils Relative: 1 %
Eosinophils Absolute: 0.1 10*3/uL (ref 0.0–0.5)
Eosinophils Relative: 2 %
HCT: 42.2 % (ref 36.0–46.0)
Hemoglobin: 13.9 g/dL (ref 12.0–15.0)
Immature Granulocytes: 1 %
Lymphocytes Relative: 14 %
Lymphs Abs: 0.9 10*3/uL (ref 0.7–4.0)
MCH: 27.9 pg (ref 26.0–34.0)
MCHC: 32.9 g/dL (ref 30.0–36.0)
MCV: 84.6 fL (ref 80.0–100.0)
Monocytes Absolute: 0.6 10*3/uL (ref 0.1–1.0)
Monocytes Relative: 9 %
Neutro Abs: 5.1 10*3/uL (ref 1.7–7.7)
Neutrophils Relative %: 73 %
Platelets: 291 10*3/uL (ref 150–400)
RBC: 4.99 MIL/uL (ref 3.87–5.11)
RDW: 18 % — ABNORMAL HIGH (ref 11.5–15.5)
WBC: 6.8 10*3/uL (ref 4.0–10.5)
nRBC: 0 % (ref 0.0–0.2)

## 2022-10-12 LAB — IRON AND TIBC
Iron: 128 ug/dL (ref 28–170)
Saturation Ratios: 31 % (ref 10.4–31.8)
TIBC: 412 ug/dL (ref 250–450)
UIBC: 284 ug/dL

## 2022-10-12 LAB — FERRITIN: Ferritin: 331 ng/mL — ABNORMAL HIGH (ref 11–307)

## 2022-10-13 ENCOUNTER — Inpatient Hospital Stay (HOSPITAL_BASED_OUTPATIENT_CLINIC_OR_DEPARTMENT_OTHER): Payer: PPO | Admitting: Internal Medicine

## 2022-10-13 ENCOUNTER — Encounter: Payer: Self-pay | Admitting: Internal Medicine

## 2022-10-13 VITALS — BP 150/89 | HR 66 | Temp 96.6°F | Resp 20 | Wt 170.1 lb

## 2022-10-13 DIAGNOSIS — D508 Other iron deficiency anemias: Secondary | ICD-10-CM

## 2022-10-13 DIAGNOSIS — E538 Deficiency of other specified B group vitamins: Secondary | ICD-10-CM | POA: Diagnosis not present

## 2022-10-13 DIAGNOSIS — D509 Iron deficiency anemia, unspecified: Secondary | ICD-10-CM | POA: Diagnosis not present

## 2022-10-13 LAB — ERYTHROPOIETIN: Erythropoietin: 16 m[IU]/mL (ref 2.6–18.5)

## 2022-10-13 NOTE — Progress Notes (Signed)
Karen Madden  [phone] [phone]  ID: Karen Madden OB: 08/13/1946  MR#: [id number]  ZYS#:[id number]  Patient Care Team: Teodora Medici, DO as PCP - General (Internal Medicine)  REFERRING PROVIDER: Dr. Rosana Berger  REASON FOR REFERRAL: iron deficiency anemia   HPI: Karen Madden is a 76 y.o. female with pmh of CAD, HTN, hypothyroidism, OSA and colon adenocarcinoma pT3N0 status post resection on 10/12/2016 by Dr. Donalee Citrin at [facility] did not receive adjuvant treatment was seen for iron deficiency anemia.   Patient reported some burning sensation in the epigastric region when eating tomato sauce which is new for her.  She denies any blood in urine or stools.  Denies any black tarry stools. Patient denies fever, chills, nausea, vomiting, shortness of breath, cough, abdominal pain, bowel or bladder issues. Energy level is good.  Appetite is good.  Denies any weight loss.  Patient has a history of stage II colon cancer status post resection on 10/12/2016, pT3N0 by Dr. Donalee Citrin at [facility].  She did not require any adjuvant treatment.  She was tested for Lynch syndrome which was negative.  This was diagnosed when she developed iron deficiency anemia.  Her last colonoscopy was 3 years ago which showed internal hemorrhoids otherwise normal.  She reports family history of colon cancer in brother at age 85.    CBC showed hemoglobin of 10.4 and ferritin 12. Patient could not tolerate oral iron due to GI upset.  She reports using ibuprofen for her left knee pain which started after a fall and thinks might have overused it.  She stopped it now and has been using Tylenol.  She is also seeing orthopedics for right shoulder pain and receiving cortisone shots.  She is also on diuretics for her history of CHF which is being managed by her PCP.  REVIEW OF SYSTEMS:   Review of Systems  Constitutional:  Negative for fever and weight loss.  Respiratory:  Negative for cough.    Cardiovascular:  Negative for chest pain.  Gastrointestinal:  Negative for abdominal pain, blood in stool, constipation, diarrhea, melena, nausea and vomiting.    As per HPI. Otherwise, a complete review of systems is negative.  PAST MEDICAL HISTORY: Past Medical History:  Diagnosis Date   Allergy Na   Anginal pain ([place])    Arthritis NA   Cancer ([place]) 2017   Cataract 2005 & 2007   CHF (congestive heart failure) ([facility])    Coronary artery disease    Dyspnea    GERD (gastroesophageal reflux disease)    Hypertension    Hypothyroidism    Iron deficiency anemia 08/18/2022   Myocardial infarction ([place]) 12/22/20   Sleep apnea 2023    PAST SURGICAL HISTORY: Past Surgical History:  Procedure Laterality Date   ABDOMINAL HYSTERECTOMY  1991   APPENDECTOMY  2016   BLADDER SURGERY     2011   BUNIONECTOMY     CATARACT EXTRACTION Bilateral    CHOLECYSTECTOMY  2016   COLON SURGERY  2016 & 2017   CORONARY/GRAFT ACUTE MI REVASCULARIZATION N/A 12/22/2020   Procedure: Coronary/Graft Acute MI Revascularization;  Surgeon: Yolonda Kida, MD;  Location: [place] [facility];  Service: Cardiovascular;  Laterality: N/A;   HERNIA REPAIR  2021   LEFT HEART CATH AND CORONARY ANGIOGRAPHY N/A 12/22/2020   Procedure: LEFT HEART CATH AND CORONARY ANGIOGRAPHY;  Surgeon: Yolonda Kida, MD;  Location: [place] [facility];  Service: Cardiovascular;  Laterality: N/A;   LEFT HEART  CATH AND CORONARY ANGIOGRAPHY N/A 07/21/2021   Procedure: LEFT HEART CATH AND CORONARY ANGIOGRAPHY with possible Intervention;  Surgeon: Yolonda Kida, MD;  Location: [place] [facility];  Service: Cardiovascular;  Laterality: N/A;    FAMILY HISTORY: Family History  Problem Relation Age of Onset   Heart disease Mother    Hypertension Mother    Obesity Mother    Vision loss Mother    Alcohol abuse Father    Cancer Brother    Diabetes Sister    Varicose Veins Daughter     HEALTH MAINTENANCE: Social History    Tobacco Use   Smoking status: Never   Smokeless tobacco: Never  Vaping Use   Vaping Use: Never used  Substance Use Topics   Alcohol use: Not Currently   Drug use: Never     Allergies  Allergen Reactions   Cyclobenzaprine Hives   Morphine And Related Anxiety    Hallucinations, nausea, vomiting, "doesn't help my pain at all"   Tetracyclines & Related Nausea And Vomiting    Current Outpatient Medications  Medication Sig Dispense Refill   aspirin 81 MG chewable tablet Chew 81 mg by mouth daily.     atorvastatin (LIPITOR) 10 MG tablet      buPROPion (WELLBUTRIN XL) 300 MG 24 hr tablet Take 1 tablet (300 mg total) by mouth daily. 90 tablet 1   Cholecalciferol 125 MCG (5000 UT) TABS Take 5,000 Units by mouth daily.     clopidogrel (PLAVIX) 75 MG tablet Take 75 mg by mouth daily.     cyanocobalamin (VITAMIN B12) 1000 MCG tablet Take 1 tablet (1,000 mcg total) by mouth daily. 90 tablet 1   diphenhydrAMINE (SIMPLY SLEEP) 25 MG tablet Take 25 mg by mouth at bedtime as needed for sleep.     ezetimibe (ZETIA) 10 MG tablet Take 10 mg by mouth daily.     famotidine (PEPCID) 20 MG tablet Take 20 mg by mouth 2 (two) times daily.     furosemide (LASIX) 20 MG tablet Take 0.5 tablets (10 mg total) by mouth daily. 30 tablet 2   gabapentin (NEURONTIN) 400 MG capsule Take 1 capsule (400 mg total) by mouth at bedtime. 90 capsule 1   hydrochlorothiazide (HYDRODIURIL) 25 MG tablet Take 25 mg by mouth daily.     KLOR-CON M20 20 MEQ tablet Take 20 mEq by mouth daily.     levothyroxine (SYNTHROID) 75 MCG tablet Take 75 mcg by mouth daily before breakfast.     loperamide (IMODIUM A-D) 2 MG tablet Take 2 mg by mouth 4 (four) times daily as needed for diarrhea or loose stools.     metoprolol succinate (TOPROL-XL) 25 MG 24 hr tablet Take 0.5 tablets (12.5 mg total) by mouth daily. (Patient taking differently: Take 25 mg by mouth daily.) 15 tablet 0   Multiple Vitamins-Minerals (PRESERVISION AREDS) TABS Take  1 tablet by mouth daily.     nitroGLYCERIN (NITROSTAT) 0.4 MG SL tablet Place 0.4 mg under the tongue every 2 (two) hours as needed for chest pain.     omeprazole (PRILOSEC) 20 MG capsule Take 1 capsule (20 mg total) by mouth 2 (two) times daily before a meal for 14 days. 28 capsule 0   ondansetron (ZOFRAN-ODT) 4 MG disintegrating tablet Take 1 tablet (4 mg total) by mouth every 8 (eight) hours as needed for nausea or vomiting. 16 tablet 0   triamcinolone (KENALOG) 0.025 % cream Apply 1 application topically daily as needed (Rash).  No current facility-administered medications for this visit.    OBJECTIVE: Vitals:   10/13/22 1343  BP: (!) 150/89  Pulse: 66  Resp: 20  Temp: (!) 96.6 F (35.9 C)  SpO2: 99%     Body mass index is 29.2 kg/m.      General: Well-developed, well-nourished, no acute distress. Eyes: Pink conjunctiva, anicteric sclera. HEENT: Normocephalic, moist mucous membranes, clear oropharnyx. Lungs: Clear to auscultation bilaterally. Heart: Regular rate and rhythm. No rubs, murmurs, or gallops. Abdomen: Soft, nontender, nondistended. No organomegaly noted, normoactive bowel sounds. Musculoskeletal: No edema, cyanosis, or clubbing. Neuro: Alert, answering all questions appropriately. Cranial nerves grossly intact. Skin: No rashes or petechiae noted. Psych: Normal affect. Lymphatics: No cervical, calvicular, axillary or inguinal LAD.   LAB RESULTS:  Lab Results  Component Value Date   NA 141 08/13/2022   K 4.1 08/13/2022   CL 102 08/13/2022   CO2 28 08/13/2022   GLUCOSE 109 (H) 08/13/2022   BUN 32 (H) 08/13/2022   CREATININE 1.40 (H) 08/13/2022   CALCIUM 9.7 08/13/2022   PROT 6.4 08/13/2022   ALBUMIN 3.3 (L) 12/26/2020   AST 19 08/13/2022   ALT 19 08/13/2022   ALKPHOS 58 12/26/2020   BILITOT 0.5 08/13/2022   GFRNONAA 57 (L) 12/26/2020    Lab Results  Component Value Date   WBC 6.8 10/12/2022   NEUTROABS 5.1 10/12/2022   HGB 13.9 10/12/2022    HCT 42.2 10/12/2022   MCV 84.6 10/12/2022   PLT 291 10/12/2022    Lab Results  Component Value Date   TIBC 412 10/12/2022   TIBC 568 (H) 08/13/2022   FERRITIN 331 (H) 10/12/2022   FERRITIN 12 (L) 08/13/2022   FERRITIN 65 12/26/2020   IRONPCTSAT 31 10/12/2022   IRONPCTSAT 11 (L) 08/13/2022   PATHOLOGY- 10/12/2016 COLON AND RECTUM: Resection, Including Transanal Disk Excision of Rectal Neoplasms  (Colon Res - All Specimens)    SPECIMEN     Procedure:    Other (specify): Extended right hemicolectomy   TUMOR     Primary Tumor Site:    Cannot be determined (explain): 2.8 cm distal to the anastomosis site     Histologic Type:    Adenocarcinoma     Histologic Grade:    Low-grade (well differentiated to moderately differentiated)     Tumor Size:    Greatest dimension (cm): 4.3 cm       Additional Dimension (cm):    3.9 cm       Additional Dimension (cm):    1.2 cm     Tumor Deposits:    Not identified       Site(s) of Direct Extent of Tumor:    None identified       Microscopic Tumor Extension:    Tumor invades through the muscularis propria into the subserosal adipose tissue or the nonperitonealized pericolic or perirectal soft tissues but does not extend to the serosal surface       Macroscopic Tumor Perforation:    Not Identified       Lymph-Vascular Invasion:    Not identified         Intratumoral Lymphocytic Response (tumor-infiltrating lymphocytes):    Marked (3 or more per high-power field)         Peritumor Lymphocytic Response (Crohn-like response):    Mild to moderate         Tumor Subtype and Differentiation:    Mucinous tumor component           Specify  Percentage of Mucinous Tumor Component:    40       Perineural Invasion:    Not identified   MARGINS     Uninvolved Margins:    All margins uninvolved by invasive carcinoma     Margins:    For Resection Specimens Only       Proximal Margin:             Proximal Margin:    Uninvolved by invasive carcinoma            Distance of Tumor from Margin:    Specify (cm): 5.8 cm       Distal Margin:             Distal Margin:    Uninvolved by invasive carcinoma           Distance of Tumor from Margin (required only for rectal tumors):    Specify (cm): 17.2 cm       Circumferential (Radial) Margin:    Uninvolved by invasive carcinoma         Distance of Tumor from Margin (required only for rectal tumors):    Specify (cm): 0.4 cm       Mesenteric Margin:    Uninvolved by invasive carcinoma         Distance of Tumor from Margin:    Specify (cm): 2.8 cm   LYMPH NODES     Regional Lymph Nodes:           Number of Lymph Nodes Examined:    Specify number: 39       Number of Lymph Nodes Involved:    Specify number: 0   STAGE (pTNM, AJCC 7th ed.)     Primary Tumor (pT):    pT3: Tumor invades through the muscularis propria into pericolorectal tissues     Regional Lymph Nodes (pN):    pN0: No regional lymph node metastasis   ADDITIONAL FINDINGS     Additional Pathologic Findings:    Other (specify): Organizing fat necrosis   Comment    Per the established protocol, block A5 was selected for Lynch syndrome screening by [facility]-IHC.  IHC stains performed at [facility] show the following:   MLH1:  Absent expression. PMS2:  Absent expression. MSH2:  Normal expression. MSH6:  Normal expression.   Based on these findings and the established protocol at [facility], an appropriate tumor block is selected and sent for BRAF mutation and MLH1 promoter methylation analyses.  When available, those results will be reported in an addendum.  Based on an established protocol at [facility], additional Lynch Syndrome testing was performed, with the following results:     BRAF mutation analysis: BRAF Mutation Detected Mutation(s) c. 1799T>A (p. V600E)   MLH1 hypermethylation analysis: MLH1 Methylation Detected MLH1 Methylation (%) 69.7   Based on these findings, the patient is NOT likely to have Lynch Syndrome, and based on current recommendations no  further testing is indicated.  ASSESSMENT AND PLAN:   Karen Madden is a 76 y.o. female with pmh of CAD, HTN, hypothyroidism, OSA and colon adenocarcinoma pT3N0 status post resection on 10/12/2016 by Dr. Donalee Citrin at [facility] did not receive adjuvant treatment was seen for iron deficiency anemia.   #Iron deficiency anemia -Patient could not tolerate oral iron due to GI upset.  On initial visit, hemoglobin 10.4 with ferritin of 12.  Completed IV Feraheme 550 mg x 2 doses on 09/07/2022 -Repeat iron panel showed good response to IV iron. Hemoglobin 13.9, ferritin  331 and saturation 31 -Celiac panel was negative -She saw Dr. Vicente Males of GI.  Her H. pylori test came back positive and was treated with 2 weeks of antibiotics.  After repeat testing she will be scheduled for endoscopy and colonoscopy.  Patient had been using Advil starting February for her left knee pain.  #Vitamin B12 deficiency -Continue with cyanocobalamin 1000 mcg daily  # Remote history of Stage II colon cancer  -Diagnosed with stage II colon adenocarcinoma status postresection on 10/12/2026 at [facility].  Pathology report attached above.  Did not require adjuvant treatment. -Last colonoscopy was in 2021 which showed internal hemorrhoids and was otherwise normal  RTC in 3 months for MD visit and labs.  Patient expressed understanding and was in agreement with this plan. She also understands that She can call clinic at any time with any questions, concerns, or complaints.   I spent a total of 25 minutes reviewing chart data, face-to-face evaluation with the patient, counseling and coordination of care as detailed above.  Jane Canary, MD   10/13/2022 2:59 PM

## 2022-11-09 ENCOUNTER — Encounter: Payer: Self-pay | Admitting: Nurse Practitioner

## 2022-11-09 ENCOUNTER — Other Ambulatory Visit: Payer: Self-pay

## 2022-11-09 ENCOUNTER — Ambulatory Visit (INDEPENDENT_AMBULATORY_CARE_PROVIDER_SITE_OTHER): Payer: PPO | Admitting: Nurse Practitioner

## 2022-11-09 VITALS — BP 136/82 | HR 81 | Temp 98.5°F | Resp 16 | Ht 64.0 in | Wt 171.0 lb

## 2022-11-09 DIAGNOSIS — B001 Herpesviral vesicular dermatitis: Secondary | ICD-10-CM

## 2022-11-09 DIAGNOSIS — B029 Zoster without complications: Secondary | ICD-10-CM

## 2022-11-09 MED ORDER — VALACYCLOVIR HCL 1 G PO TABS: 1000.0000 mg | ORAL_TABLET | Freq: Two times a day (BID) | ORAL | 0 refills | Status: AC

## 2022-11-09 NOTE — Progress Notes (Signed)
BP 136/82   Pulse 81   Temp 98.5 F (36.9 C) (Oral)   Resp 16   Ht '5\' 4"'$  (1.626 m)   Wt 171 lb (77.6 kg)   SpO2 98%   BMI 29.35 kg/m    Subjective:    Patient ID: Karen Madden, female    DOB: [birth year], 76 y.o.   MRN: [id number]  HPI: Karen Madden is a 76 y.o. female  Chief Complaint  Patient presents with   Rash    On lips and chest, Lips cracking, bleeding, tingling and burns.   Rash: patient reports she noticed a rash just above her lip on the right side and a rash on her chest on her right side. She says it popped up a day or so ago.   She says it tingles and burns.  She says  she has put on Aquaphor lip ointment and that has helped a little. Discussed that it does look like shingles and cold sores.  Patient states she has shingles before and felt like this was similar.  Will obtain HSV swab and start valtrex.    Relevant past medical, surgical, family and social history reviewed and updated as indicated. Interim medical history since our last visit reviewed. Allergies and medications reviewed and updated.  Review of Systems  Constitutional: Negative for fever or weight change.  Respiratory: Negative for cough and shortness of breath.   Cardiovascular: Negative for chest pain or palpitations.  Gastrointestinal: Negative for abdominal pain, no bowel changes.  Musculoskeletal: Negative for gait problem or joint swelling.  Skin: positive for rash.  Neurological: Negative for dizziness or headache.  No other specific complaints in a complete review of systems (except as listed in HPI above).      Objective:    BP 136/82   Pulse 81   Temp 98.5 F (36.9 C) (Oral)   Resp 16   Ht '5\' 4"'$  (1.626 m)   Wt 171 lb (77.6 kg)   SpO2 98%   BMI 29.35 kg/m   Wt Readings from Last 3 Encounters:  11/09/22 171 lb (77.6 kg)  10/13/22 170 lb 1.6 oz (77.2 kg)  09/08/22 173 lb 9.6 oz (78.7 kg)    Physical Exam  Constitutional: Patient appears well-developed and  well-nourished.  No distress.  HEENT: head atraumatic, normocephalic, pupils equal and reactive to light, neck supple Cardiovascular: Normal rate, regular rhythm and normal heart sounds.  No murmur heard. No BLE edema. Pulmonary/Chest: Effort normal and breath sounds normal. No respiratory distress. Abdominal: Soft.  There is no tenderness. Skin: blistery rash on right side of chest and above right side of lip  Psychiatric: Patient has a normal mood and affect. behavior is normal. Judgment and thought content normal.  Results for orders placed or performed in visit on 10/12/22  Erythropoietin  Result Value Ref Range   Erythropoietin 16.0 2.6 - 18.5 mIU/mL  Iron and TIBC([facility])  Result Value Ref Range   Iron 128 28 - 170 ug/dL   TIBC 412 250 - 450 ug/dL   Saturation Ratios 31 10.4 - 31.8 %   UIBC 284 ug/dL  Ferritin  Result Value Ref Range   Ferritin 331 (H) 11 - 307 ng/mL  CBC with Differential  Result Value Ref Range   WBC 6.8 4.0 - 10.5 K/uL   RBC 4.99 3.87 - 5.11 MIL/uL   Hemoglobin 13.9 12.0 - 15.0 g/dL   HCT 42.2 36.0 - 46.0 %   MCV 84.6 80.0 -  100.0 fL   MCH 27.9 26.0 - 34.0 pg   MCHC 32.9 30.0 - 36.0 g/dL   RDW 18.0 (H) 11.5 - 15.5 %   Platelets 291 150 - 400 K/uL   nRBC 0.0 0.0 - 0.2 %   Neutrophils Relative % 73 %   Neutro Abs 5.1 1.7 - 7.7 K/uL   Lymphocytes Relative 14 %   Lymphs Abs 0.9 0.7 - 4.0 K/uL   Monocytes Relative 9 %   Monocytes Absolute 0.6 0.1 - 1.0 K/uL   Eosinophils Relative 2 %   Eosinophils Absolute 0.1 0.0 - 0.5 K/uL   Basophils Relative 1 %   Basophils Absolute 0.0 0.0 - 0.1 K/uL   Immature Granulocytes 1 %   Abs Immature Granulocytes 0.07 0.00 - 0.07 K/uL      Assessment & Plan:   Problem List Items Addressed This Visit   None Visit Diagnoses     Herpes zoster without complication    -  Primary   start valtrex, obtained HSV swab   Relevant Medications   valACYclovir (VALTREX) 1000 MG tablet   Other Relevant Orders    Herpes simplex virus culture   Cold sore       start valtrex, obtained HSV swab   Relevant Medications   valACYclovir (VALTREX) 1000 MG tablet   Other Relevant Orders   Herpes simplex virus culture        Follow up plan: Return if symptoms worsen or fail to improve.

## 2022-11-10 ENCOUNTER — Ambulatory Visit: Payer: PPO | Admitting: Internal Medicine

## 2022-11-11 LAB — HERPES SIMPLEX VIRUS CULTURE
MICRO NUMBER:: 14213091
SPECIMEN QUALITY:: ADEQUATE

## 2022-11-17 ENCOUNTER — Other Ambulatory Visit: Payer: Self-pay | Admitting: Nurse Practitioner

## 2022-11-17 ENCOUNTER — Encounter: Payer: Self-pay | Admitting: Nurse Practitioner

## 2022-11-17 DIAGNOSIS — R21 Rash and other nonspecific skin eruption: Secondary | ICD-10-CM

## 2022-11-17 MED ORDER — MUPIROCIN 2 % EX OINT: 1.0000 | TOPICAL_OINTMENT | Freq: Two times a day (BID) | CUTANEOUS | 0 refills | Status: DC

## 2022-12-28 DIAGNOSIS — M19011 Primary osteoarthritis, right shoulder: Secondary | ICD-10-CM | POA: Diagnosis not present

## 2023-01-07 ENCOUNTER — Ambulatory Visit: Payer: PPO | Admitting: Gastroenterology

## 2023-01-08 ENCOUNTER — Ambulatory Visit: Payer: PPO | Admitting: Internal Medicine

## 2023-01-14 ENCOUNTER — Encounter: Payer: Self-pay | Admitting: Internal Medicine

## 2023-01-14 ENCOUNTER — Inpatient Hospital Stay: Payer: PPO | Attending: Internal Medicine

## 2023-01-14 ENCOUNTER — Inpatient Hospital Stay (HOSPITAL_BASED_OUTPATIENT_CLINIC_OR_DEPARTMENT_OTHER): Payer: PPO | Admitting: Internal Medicine

## 2023-01-14 VITALS — BP 145/82 | HR 64 | Temp 97.8°F | Resp 20 | Wt 169.9 lb

## 2023-01-14 DIAGNOSIS — Z90710 Acquired absence of both cervix and uterus: Secondary | ICD-10-CM | POA: Insufficient documentation

## 2023-01-14 DIAGNOSIS — D508 Other iron deficiency anemias: Secondary | ICD-10-CM

## 2023-01-14 DIAGNOSIS — E538 Deficiency of other specified B group vitamins: Secondary | ICD-10-CM

## 2023-01-14 DIAGNOSIS — I110 Hypertensive heart disease with heart failure: Secondary | ICD-10-CM | POA: Insufficient documentation

## 2023-01-14 DIAGNOSIS — Z800 Family history of malignant neoplasm of digestive organs: Secondary | ICD-10-CM | POA: Insufficient documentation

## 2023-01-14 DIAGNOSIS — Z85038 Personal history of other malignant neoplasm of large intestine: Secondary | ICD-10-CM | POA: Insufficient documentation

## 2023-01-14 DIAGNOSIS — I509 Heart failure, unspecified: Secondary | ICD-10-CM | POA: Insufficient documentation

## 2023-01-14 DIAGNOSIS — D509 Iron deficiency anemia, unspecified: Secondary | ICD-10-CM | POA: Insufficient documentation

## 2023-01-14 LAB — CBC WITH DIFFERENTIAL/PLATELET
Abs Immature Granulocytes: 0.04 10*3/uL (ref 0.00–0.07)
Basophils Absolute: 0.1 10*3/uL (ref 0.0–0.1)
Basophils Relative: 1 %
Eosinophils Absolute: 0.2 10*3/uL (ref 0.0–0.5)
Eosinophils Relative: 2 %
HCT: 42.9 % (ref 36.0–46.0)
Hemoglobin: 13.9 g/dL (ref 12.0–15.0)
Immature Granulocytes: 1 %
Lymphocytes Relative: 15 %
Lymphs Abs: 1.1 10*3/uL (ref 0.7–4.0)
MCH: 30.5 pg (ref 26.0–34.0)
MCHC: 32.4 g/dL (ref 30.0–36.0)
MCV: 94.1 fL (ref 80.0–100.0)
Monocytes Absolute: 0.6 10*3/uL (ref 0.1–1.0)
Monocytes Relative: 9 %
Neutro Abs: 5.4 10*3/uL (ref 1.7–7.7)
Neutrophils Relative %: 72 %
Platelets: 273 10*3/uL (ref 150–400)
RBC: 4.56 MIL/uL (ref 3.87–5.11)
RDW: 13.8 % (ref 11.5–15.5)
WBC: 7.4 10*3/uL (ref 4.0–10.5)
nRBC: 0 % (ref 0.0–0.2)

## 2023-01-14 LAB — FERRITIN: Ferritin: 101 ng/mL (ref 11–307)

## 2023-01-14 LAB — IRON AND TIBC
Iron: 111 ug/dL (ref 28–170)
Saturation Ratios: 24 % (ref 10.4–31.8)
TIBC: 465 ug/dL — ABNORMAL HIGH (ref 250–450)
UIBC: 354 ug/dL

## 2023-01-14 LAB — VITAMIN B12: Vitamin B-12: 1299 pg/mL — ABNORMAL HIGH (ref 180–914)

## 2023-01-14 NOTE — Progress Notes (Signed)
Patient states when she urinate (Sometimes) she getting a shooting feeling of pins and needles through her body. Patient doesn't know if she need to go a urologist or gyn.

## 2023-01-14 NOTE — Progress Notes (Signed)
Karen Madden  [phone] [phone]) [phone]  ID: Karen Madden OB: September 06, 1946  MR#: [id number]  HYQ#:[id number]  Patient Care Team: Teodora Medici, DO as PCP - General (Internal Medicine)    HPI: Karen Madden is a 77 y.o. female with pmh of CAD, HTN, hypothyroidism, OSA and colon adenocarcinoma pT3N0 status post resection on 10/12/2016 by Dr. Donalee Citrin at [facility] did not receive adjuvant treatment was seen for iron deficiency anemia.   Patient reported some burning sensation in the epigastric region when eating tomato sauce which is new for her.  She denies any blood in urine or stools.  Denies any black tarry stools. Patient denies fever, chills, nausea, vomiting, shortness of breath, cough, abdominal pain, bowel or bladder issues. Energy level is good.  Appetite is good.  Denies any weight loss.  Patient has a history of stage II colon cancer status post resection on 10/12/2016, pT3N0 by Dr. Donalee Citrin at [facility].  She did not require any adjuvant treatment.  She was tested for Lynch syndrome which was negative.  This was diagnosed when she developed iron deficiency anemia.  Her last colonoscopy was 3 years ago which showed internal hemorrhoids otherwise normal.  She reports family history of colon cancer in brother at age 73.    CBC showed hemoglobin of 10.4 and ferritin 12. Patient could not tolerate oral iron due to GI upset.  She reports using ibuprofen for her left knee pain which started after a fall and thinks might have overused it.  She stopped it now and has been using Tylenol.  She is also seeing orthopedics for right shoulder pain and receiving cortisone shots.  She is also on diuretics for her history of CHF which is being managed by her PCP.  Interval history Patient was seen today for iron levels to be checked. She has been feeling well since iron infusions.  Her energy levels have been better.  She reported sensation of pins and needle while she  urinates and that happens occasionally.  Things has been ongoing for about 6 to 8 months.  Has prior history of bladder prolapse surgery done twice in [place].  Denies any burning, frequency, urgency or incomplete bladder emptying sensation.  REVIEW OF SYSTEMS:   Review of Systems  Constitutional:  Negative for fever and weight loss.  Respiratory:  Negative for cough.   Cardiovascular:  Negative for chest pain.  Gastrointestinal:  Negative for abdominal pain, blood in stool, constipation, diarrhea, melena, nausea and vomiting.    As per HPI. Otherwise, a complete review of systems is negative.  PAST MEDICAL HISTORY: Past Medical History:  Diagnosis Date   Allergy Na   Anginal pain ([place])    Arthritis NA   Cancer ([place]) 2017   Cataract 2005 & 2007   CHF (congestive heart failure) (HCC)    Coronary artery disease    Dyspnea    GERD (gastroesophageal reflux disease)    Hypertension    Hypothyroidism    Iron deficiency anemia 08/18/2022   Myocardial infarction ([place]) 12/22/20   Sleep apnea 2023    PAST SURGICAL HISTORY: Past Surgical History:  Procedure Laterality Date   ABDOMINAL HYSTERECTOMY  1991   APPENDECTOMY  2016   BLADDER SURGERY     2011   BUNIONECTOMY     CATARACT EXTRACTION Bilateral    CHOLECYSTECTOMY  2016   COLON SURGERY  2016 & 2017   CORONARY/GRAFT ACUTE MI REVASCULARIZATION N/A 12/22/2020   Procedure: Coronary/Graft Acute  MI Revascularization;  Surgeon: Yolonda Kida, MD;  Location: [facility];  Service: Cardiovascular;  Laterality: N/A;   HERNIA REPAIR  2021   LEFT HEART CATH AND CORONARY ANGIOGRAPHY N/A 12/22/2020   Procedure: LEFT HEART CATH AND CORONARY ANGIOGRAPHY;  Surgeon: Yolonda Kida, MD;  Location: [facility];  Service: Cardiovascular;  Laterality: N/A;   LEFT HEART CATH AND CORONARY ANGIOGRAPHY N/A 07/21/2021   Procedure: LEFT HEART CATH AND CORONARY ANGIOGRAPHY with possible Intervention;  Surgeon: Yolonda Kida, MD;  Location: [facility];  Service: Cardiovascular;  Laterality: N/A;    FAMILY HISTORY: Family History  Problem Relation Age of Onset   Heart disease Mother    Hypertension Mother    Obesity Mother    Vision loss Mother    Alcohol abuse Father    Cancer Brother    Diabetes Sister    Varicose Veins Daughter     HEALTH MAINTENANCE: Social History   Tobacco Use   Smoking status: Never   Smokeless tobacco: Never  Vaping Use   Vaping Use: Never used  Substance Use Topics   Alcohol use: Not Currently   Drug use: Never     Allergies  Allergen Reactions   Cyclobenzaprine Hives   Morphine And Related Anxiety    Hallucinations, nausea, vomiting, "doesn't help my pain at all"   Tetracyclines & Related Nausea And Vomiting    Current Outpatient Medications  Medication Sig Dispense Refill   aspirin 81 MG chewable tablet Chew 81 mg by mouth daily.     atorvastatin (LIPITOR) 10 MG tablet      buPROPion (WELLBUTRIN XL) 300 MG 24 hr tablet Take 1 tablet (300 mg total) by mouth daily. 90 tablet 1   Cholecalciferol 125 MCG (5000 UT) TABS Take 5,000 Units by mouth daily.     clopidogrel (PLAVIX) 75 MG tablet Take 75 mg by mouth daily.     cyanocobalamin (VITAMIN B12) 1000 MCG tablet Take 1 tablet (1,000 mcg total) by mouth daily. 90 tablet 1   ezetimibe (ZETIA) 10 MG tablet Take 10 mg by mouth daily.     famotidine (PEPCID) 20 MG tablet Take 20 mg by mouth 2 (two) times daily.     furosemide (LASIX) 20 MG tablet Take 0.5 tablets (10 mg total) by mouth daily. 30 tablet 2   gabapentin (NEURONTIN) 400 MG capsule Take 1 capsule (400 mg total) by mouth at bedtime. 90 capsule 1   hydrochlorothiazide (HYDRODIURIL) 25 MG tablet Take 25 mg by mouth daily.     KLOR-CON M20 20 MEQ tablet Take 20 mEq by mouth daily.     levothyroxine (SYNTHROID) 75 MCG tablet Take 75 mcg by mouth daily before breakfast.     loperamide (IMODIUM A-D) 2 MG tablet Take 2 mg by mouth 4 (four) times  daily as needed for diarrhea or loose stools.     metoprolol succinate (TOPROL-XL) 25 MG 24 hr tablet Take 0.5 tablets (12.5 mg total) by mouth daily. (Patient taking differently: Take 25 mg by mouth daily.) 15 tablet 0   Multiple Vitamins-Minerals (PRESERVISION AREDS) TABS Take 1 tablet by mouth daily.     mupirocin ointment (BACTROBAN) 2 % Apply 1 Application topically 2 (two) times daily. 22 g 0   nitroGLYCERIN (NITROSTAT) 0.4 MG SL tablet Place 0.4 mg under the tongue every 2 (two) hours as needed for chest pain.     omeprazole (PRILOSEC) 20 MG capsule Take 1 capsule (20 mg  total) by mouth 2 (two) times daily before a meal for 14 days. 28 capsule 0   ondansetron (ZOFRAN-ODT) 4 MG disintegrating tablet Take 1 tablet (4 mg total) by mouth every 8 (eight) hours as needed for nausea or vomiting. 16 tablet 0   diphenhydrAMINE (SIMPLY SLEEP) 25 MG tablet Take 25 mg by mouth at bedtime as needed for sleep.     triamcinolone (KENALOG) 0.025 % cream Apply 1 application topically daily as needed (Rash). (Patient not taking: Reported on 11/09/2022)     No current facility-administered medications for this visit.    OBJECTIVE: Vitals:   01/14/23 1337  BP: (!) 145/82  Pulse: 64  Resp: 20  Temp: 97.8 F (36.6 C)  SpO2: 97%     Body mass index is 29.16 kg/m.      General: Well-developed, well-nourished, no acute distress. Eyes: Pink conjunctiva, anicteric sclera. HEENT: Normocephalic, moist mucous membranes, clear oropharnyx. Lungs: Clear to auscultation bilaterally. Heart: Regular rate and rhythm. No rubs, murmurs, or gallops. Abdomen: Soft, nontender, nondistended. No organomegaly noted, normoactive bowel sounds. Musculoskeletal: No edema, cyanosis, or clubbing. Neuro: Alert, answering all questions appropriately. Cranial nerves grossly intact. Skin: No rashes or petechiae noted. Psych: Normal affect. Lymphatics: No cervical, calvicular, axillary or inguinal LAD.   LAB RESULTS:  Lab  Results  Component Value Date   NA 141 08/13/2022   K 4.1 08/13/2022   CL 102 08/13/2022   CO2 28 08/13/2022   GLUCOSE 109 (H) 08/13/2022   BUN 32 (H) 08/13/2022   CREATININE 1.40 (H) 08/13/2022   CALCIUM 9.7 08/13/2022   PROT 6.4 08/13/2022   ALBUMIN 3.3 (L) 12/26/2020   AST 19 08/13/2022   ALT 19 08/13/2022   ALKPHOS 58 12/26/2020   BILITOT 0.5 08/13/2022   GFRNONAA 57 (L) 12/26/2020    Lab Results  Component Value Date   WBC 7.4 01/14/2023   NEUTROABS 5.4 01/14/2023   HGB 13.9 01/14/2023   HCT 42.9 01/14/2023   MCV 94.1 01/14/2023   PLT 273 01/14/2023    Lab Results  Component Value Date   TIBC 465 (H) 01/14/2023   TIBC 412 10/12/2022   TIBC 568 (H) 08/13/2022   FERRITIN 101 01/14/2023   FERRITIN 331 (H) 10/12/2022   FERRITIN 12 (L) 08/13/2022   IRONPCTSAT 24 01/14/2023   IRONPCTSAT 31 10/12/2022   IRONPCTSAT 11 (L) 08/13/2022   PATHOLOGY- 10/12/2016 COLON AND RECTUM: Resection, Including Transanal Disk Excision of Rectal Neoplasms  (Colon Res - All Specimens)    SPECIMEN     Procedure:    Other (specify): Extended right hemicolectomy   TUMOR     Primary Tumor Site:    Cannot be determined (explain): 2.8 cm distal to the anastomosis site     Histologic Type:    Adenocarcinoma     Histologic Grade:    Low-grade (well differentiated to moderately differentiated)     Tumor Size:    Greatest dimension (cm): 4.3 cm       Additional Dimension (cm):    3.9 cm       Additional Dimension (cm):    1.2 cm     Tumor Deposits:    Not identified       Site(s) of Direct Extent of Tumor:    None identified       Microscopic Tumor Extension:    Tumor invades through the muscularis propria into the subserosal adipose tissue or the nonperitonealized pericolic or perirectal soft tissues but does not extend to  the serosal surface       Macroscopic Tumor Perforation:    Not Identified       Lymph-Vascular Invasion:    Not identified         Intratumoral Lymphocytic Response  (tumor-infiltrating lymphocytes):    Marked (3 or more per high-power field)         Peritumor Lymphocytic Response (Crohn-like response):    Mild to moderate         Tumor Subtype and Differentiation:    Mucinous tumor component           Specify Percentage of Mucinous Tumor Component:    40       Perineural Invasion:    Not identified   MARGINS     Uninvolved Margins:    All margins uninvolved by invasive carcinoma     Margins:    For Resection Specimens Only       Proximal Margin:             Proximal Margin:    Uninvolved by invasive carcinoma           Distance of Tumor from Margin:    Specify (cm): 5.8 cm       Distal Margin:             Distal Margin:    Uninvolved by invasive carcinoma           Distance of Tumor from Margin (required only for rectal tumors):    Specify (cm): 17.2 cm       Circumferential (Radial) Margin:    Uninvolved by invasive carcinoma         Distance of Tumor from Margin (required only for rectal tumors):    Specify (cm): 0.4 cm       Mesenteric Margin:    Uninvolved by invasive carcinoma         Distance of Tumor from Margin:    Specify (cm): 2.8 cm   LYMPH NODES     Regional Lymph Nodes:           Number of Lymph Nodes Examined:    Specify number: 39       Number of Lymph Nodes Involved:    Specify number: 0   STAGE (pTNM, AJCC 7th ed.)     Primary Tumor (pT):    pT3: Tumor invades through the muscularis propria into pericolorectal tissues     Regional Lymph Nodes (pN):    pN0: No regional lymph node metastasis   ADDITIONAL FINDINGS     Additional Pathologic Findings:    Other (specify): Organizing fat necrosis   Comment    Per the established protocol, block A5 was selected for Lynch syndrome screening by [facility]-I[facility].  IHC stains performed at [facility] show the following:   MLH1:  Absent expression. PMS2:  Absent expression. MSH2:  Normal expression. MSH6:  Normal expression.   Based on these findings and the established protocol at [place], an appropriate  tumor block is selected and sent for BRAF mutation and MLH1 promoter methylation analyses.  When available, those results will be reported in an addendum.  Based on an established protocol at [facility], additional Lynch Syndrome testing was performed, with the following results:     BRAF mutation analysis: BRAF Mutation Detected Mutation(s) c. 1799T>A (p. V600E)   MLH1 hypermethylation analysis: MLH1 Methylation Detected MLH1 Methylation (%) 69.7   Based on these findings, the patient is NOT likely to have Lynch Syndrome, and based on current  recommendations no further testing is indicated.  ASSESSMENT AND PLAN:   Karen Madden is a 76 y.o. female with pmh of CAD, HTN, hypothyroidism, OSA and colon adenocarcinoma pT3N0 status post resection on 10/12/2016 by Dr. Donalee Citrin at [facility] did not receive adjuvant treatment was seen for iron deficiency anemia.   #Iron deficiency anemia -Patient could not tolerate oral iron due to GI upset.  Completed IV Feraheme 550 mg x 2 doses on 09/07/2022.  Patient had good response to the iron infusions. Hemoglobin continues to be stable at 13.9.  Iron levels continue to be normal.  -Celiac panel was negative.  Follows with Dr. Vicente Males of GI.  H. pylori test came back positive and was treated with 2 weeks of antibiotics.  Patient has stopped using NSAIDs.  Patient has decided to hold off on colonoscopy and endoscopy at this time since her iron levels have been stable.  She would like to wait for another check in the next 4 to 5 months.  If they drop, then would like to consider further workup.  #Vitamin B12 deficiency -Continue with cyanocobalamin 1000 mcg daily -Recheck B12 level today.  # Remote history of Stage II colon cancer  -Diagnosed with stage II colon adenocarcinoma status postresection on 10/12/2026 at [facility].  Pathology report attached above.  Did not require adjuvant treatment. -Last colonoscopy was in 2021 which showed internal hemorrhoids and was  otherwise normal  Regarding the pins and needle sensation while she is having during urination patient would like to monitor her symptoms.  If they continue to get worse advised to reach out to PCP for referral to Gyn.  RTC in 5 months for MD visit, labs.  Patient expressed understanding and was in agreement with this plan. She also understands that She can call clinic at any time with any questions, concerns, or complaints.   I spent a total of 25 minutes reviewing chart data, face-to-face evaluation with the patient, counseling and coordination of care as detailed above.  Jane Canary, MD   01/14/2023 4:01 PM

## 2023-01-20 ENCOUNTER — Encounter: Payer: Self-pay | Admitting: Internal Medicine

## 2023-01-21 ENCOUNTER — Encounter: Payer: Self-pay | Admitting: Internal Medicine

## 2023-01-21 ENCOUNTER — Ambulatory Visit (INDEPENDENT_AMBULATORY_CARE_PROVIDER_SITE_OTHER): Payer: PPO | Admitting: Internal Medicine

## 2023-01-21 VITALS — BP 128/70 | HR 71 | Temp 98.1°F | Resp 16 | Ht 64.0 in | Wt 170.2 lb

## 2023-01-21 DIAGNOSIS — N3000 Acute cystitis without hematuria: Secondary | ICD-10-CM | POA: Diagnosis not present

## 2023-01-21 DIAGNOSIS — R300 Dysuria: Secondary | ICD-10-CM | POA: Diagnosis not present

## 2023-01-21 LAB — POCT URINALYSIS DIPSTICK
Bilirubin, UA: NEGATIVE
Blood, UA: NEGATIVE
Glucose, UA: NEGATIVE
Ketones, UA: NEGATIVE
Nitrite, UA: NEGATIVE
Protein, UA: NEGATIVE
Spec Grav, UA: 1.02 (ref 1.010–1.025)
Urobilinogen, UA: 0.2 E.U./dL
pH, UA: 6.5 (ref 5.0–8.0)

## 2023-01-21 MED ORDER — SULFAMETHOXAZOLE-TRIMETHOPRIM 800-160 MG PO TABS: 1.0000 | ORAL_TABLET | Freq: Two times a day (BID) | ORAL | 0 refills | Status: AC

## 2023-01-21 NOTE — Progress Notes (Signed)
Acute Office Visit  Subjective:     Patient ID: HAYLYNN PHA, female    DOB: 07/29/1946, 77 y.o.   MRN: [id number]  Chief Complaint  Patient presents with   Urinary Tract Infection    Weird sensation that moves up bladder onset 1 month    HPI Patient is in today for concerns for UTI.  URINARY SYMPTOMS Dysuria: no Urinary frequency: no Urgency: yes Small volume voids: yes Urinary incontinence: no Foul odor: yes Hematuria: no Abdominal pain: no Back pain: no Suprapubic pain/pressure: yes Flank pain: no Fever:  no Vomiting: no Recurrent urinary tract infection: no Sexual activity: No sexually active/monogomous/practicing safe sex Vaginal discharge: no Treatments attempted: none    Review of Systems  Constitutional:  Negative for chills and fever.  Gastrointestinal:  Negative for abdominal pain.  Genitourinary:  Positive for frequency and urgency. Negative for dysuria, flank pain and hematuria.        Objective:    BP 128/70   Pulse 71   Temp 98.1 F (36.7 C)   Resp 16   Ht '5\' 4"'$  (1.626 m)   Wt 170 lb 3.2 oz (77.2 kg)   SpO2 97%   BMI 29.21 kg/m  BP Readings from Last 3 Encounters:  01/21/23 128/70  01/14/23 (!) 145/82  11/09/22 136/82   Wt Readings from Last 3 Encounters:  01/21/23 170 lb 3.2 oz (77.2 kg)  01/14/23 169 lb 14.4 oz (77.1 kg)  11/09/22 171 lb (77.6 kg)      Physical Exam Constitutional:      Appearance: Normal appearance.  HENT:     Head: Normocephalic and atraumatic.  Eyes:     Conjunctiva/sclera: Conjunctivae normal.  Cardiovascular:     Rate and Rhythm: Normal rate and regular rhythm.  Pulmonary:     Effort: Pulmonary effort is normal.     Breath sounds: Normal breath sounds.  Abdominal:     Tenderness: There is no right CVA tenderness or left CVA tenderness.  Skin:    General: Skin is warm and dry.  Neurological:     General: No focal deficit present.     Mental Status: She is alert. Mental status is at baseline.   Psychiatric:        Mood and Affect: Mood normal.        Behavior: Behavior normal.     Results for orders placed or performed in visit on 01/21/23  POCT urinalysis dipstick  Result Value Ref Range   Color, UA yellow    Clarity, UA clear    Glucose, UA Negative Negative   Bilirubin, UA neg    Ketones, UA neg    Spec Grav, UA 1.020 1.010 - 1.025   Blood, UA neg    pH, UA 6.5 5.0 - 8.0   Protein, UA Negative Negative   Urobilinogen, UA 0.2 0.2 or 1.0 E.U./dL   Nitrite, UA neg    Leukocytes, UA Large (3+) (A) Negative   Appearance yellow    Odor strong         Assessment & Plan:   1. Acute cystitis without hematuria: 3+ leukocytes on UA, will empirically treat due to symptoms with Bactrim x 3 days. Urine culture sent. Recommend Azo as well. Follow up if symptoms worsen or fail to improve.   - POCT urinalysis dipstick - Urine Culture - sulfamethoxazole-trimethoprim (BACTRIM DS) 800-160 MG tablet; Take 1 tablet by mouth 2 (two) times daily for 3 days.  Dispense: 6 tablet; Refill: 0  Return if symptoms worsen or fail to improve.  Teodora Medici, DO

## 2023-01-21 NOTE — Patient Instructions (Addendum)
It was great seeing you today!  Plan discussed at today's visit: -Urine positive for leukocytes, will send for urine culture -Treat with an antibiotic, Bactrim twice a day for 3 days  -Can take Azo over the counter to help numb the bladder - can make urine orange/red in color   Follow up in: as needed   Take care and let us know if you have any questions or concerns prior to your next visit.  Dr. Rosana Berger

## 2023-01-24 LAB — URINE CULTURE
MICRO NUMBER:: 14506198
SPECIMEN QUALITY:: ADEQUATE

## 2023-01-29 ENCOUNTER — Other Ambulatory Visit: Payer: Self-pay | Admitting: Internal Medicine

## 2023-01-29 DIAGNOSIS — G2581 Restless legs syndrome: Secondary | ICD-10-CM

## 2023-01-29 NOTE — Telephone Encounter (Signed)
Requested Prescriptions  Pending Prescriptions Disp Refills   gabapentin (NEURONTIN) 400 MG capsule [Pharmacy Med Name: GABAPENTIN 400 MG CAPSULE] 90 capsule 3    Sig: TAKE 1 CAPSULE BY MOUTH AT BEDTIME.     Neurology: Anticonvulsants - gabapentin Failed - 01/29/2023 11:08 AM      Failed - Cr in normal range and within 360 days    Creat  Date Value Ref Range Status  08/13/2022 1.40 (H) 0.60 - 1.00 mg/dL Final         Passed - Completed PHQ-2 or PHQ-9 in the last 360 days      Passed - Valid encounter within last 12 months    Recent Outpatient Visits           1 week ago Acute cystitis without hematuria   [facility], DO   2 months ago Herpes zoster without complication   [facility] Bo Merino, FNP   4 months ago Primary hypertension   [facility], DO   5 months ago Primary hypertension   [facility] Teodora Medici, [place]

## 2023-02-16 ENCOUNTER — Encounter: Payer: Self-pay | Admitting: Internal Medicine

## 2023-02-25 ENCOUNTER — Other Ambulatory Visit: Payer: Self-pay | Admitting: Internal Medicine

## 2023-02-25 DIAGNOSIS — F330 Major depressive disorder, recurrent, mild: Secondary | ICD-10-CM

## 2023-03-08 ENCOUNTER — Encounter: Payer: Self-pay | Admitting: Internal Medicine

## 2023-03-08 NOTE — Progress Notes (Unsigned)
   Acute Office Visit  Subjective:     Patient ID: NARE KLINKO, female    DOB: 05-Nov-1946, 77 y.o.   MRN: [id number]  No chief complaint on file.   HPI Patient is in today for toe pain.   FOOT PAIN Duration: {Blank single:19197::"chronic","days","weeks","months"} Involved foot: {Blank single:19197::"left","right","bilateral"} Mechanism of injury: {Blank single:19197::"trauma","unknown"} Location:  Onset: {Blank single:19197::"sudden","gradual"}  Severity: {Blank single:19197::"mild","moderate","severe","1/10","2/10","3/10","4/10","5/10","6/10","7/10","8/10","9/10","10/10"}  Quality:  {Blank multiple:19196::"sharp","dull","aching","burning","cramping","ill-defined","itchy","pressure-like","pulling","shooting","sore","stabbing","tender","tearing","throbbing"} Frequency: {Blank single:19197::"constant","intermittent","occasional","rare","every few minutes","a few times a hour","a few times a day","a few times a week","a few times a month","a few times a year"} Radiation: {Blank single:19197::"yes","no"} Aggravating factors: {Blank multiple:19196::"weight bearing","walking","running","stairs","bending","movement","prolonged sitting"}  Alleviating factors: {Blank multiple:19196::"nothing","ice","physical therapy","HEP","APAP","NSAIDs","brace","crutches","rest"}  Status: {Blank multiple:19196::"better","worse","stable","fluctuating"} Treatments attempted: {Blank multiple:19196::"none","rest","ice","heat","APAP","ibuprofen","aleve","physical therapy","HEP"}  Relief with NSAIDs?:  {Blank single:19197::"No NSAIDs Taken","no","mild","moderate","significant"} Weakness with weight bearing or walking: {Blank single:19197::"yes","no"} Morning stiffness: {Blank single:19197::"yes","no"} Swelling: {Blank single:19197::"yes","no"} Redness: {Blank single:19197::"yes","no"} Bruising: {Blank single:19197::"yes","no"} Paresthesias / decreased sensation: {Blank single:19197::"yes","no"}  Fevers:{Blank  single:19197::"yes","no"}   ROS      Objective:    There were no vitals taken for this visit. {Vitals History (Optional):23777}  Physical Exam  No results found for any visits on 03/09/23.      Assessment & Plan:   Problem List Items Addressed This Visit   None   No orders of the defined types were placed in this encounter.   No follow-ups on file.  Teodora Medici, DO

## 2023-03-09 ENCOUNTER — Ambulatory Visit (INDEPENDENT_AMBULATORY_CARE_PROVIDER_SITE_OTHER): Payer: PPO | Admitting: Internal Medicine

## 2023-03-09 ENCOUNTER — Encounter: Payer: Self-pay | Admitting: Internal Medicine

## 2023-03-09 VITALS — BP 122/84 | HR 76 | Temp 97.8°F | Resp 16 | Ht 64.0 in | Wt 169.8 lb

## 2023-03-09 DIAGNOSIS — L84 Corns and callosities: Secondary | ICD-10-CM

## 2023-03-09 NOTE — Patient Instructions (Addendum)
It was great seeing you today!  Plan discussed at today's visit: -Referral to Podiatry placed for callus removal   Follow up in: 6 months   Take care and let us know if you have any questions or concerns prior to your next visit.  Dr. Rosana Berger  Corns and Calluses Corns are small areas of thickened skin that form on the top, sides, or tip of a toe. Corns have a cone-shaped core with a point that can press on a nerve below. This causes pain. Calluses are areas of thickened skin that can form anywhere on the body, including the hands, fingers, palms, soles of the feet, and heels. Calluses are usually larger than corns. What are the causes? Corns and calluses are caused by rubbing (friction) or pressure, such as from shoes that are too tight or do not fit properly. What increases the risk? Corns are more likely to develop in people who have misshapen toes (toe deformities), such as hammer toes. Calluses can form with friction to any area of the skin. They are more likely to develop in people who: Work with their hands. Wear shoes that fit poorly, are too tight, or are high-heeled. Have toe deformities. What are the signs or symptoms? Symptoms of a corn or callus include: A hard growth on the skin. Pain or tenderness under the skin. Redness and swelling. Increased discomfort while wearing tight-fitting shoes, if your feet are affected. If a corn or callus becomes infected, symptoms may include: Redness and swelling that gets worse. Pain. Fluid, blood, or pus draining from the corn or callus. How is this diagnosed? Corns and calluses may be diagnosed based on your symptoms, your medical history, and a physical exam. How is this treated? Treatment for corns and calluses may include: Removing the cause of the friction or pressure. This may involve: Changing your shoes. Wearing shoe inserts (orthotics) or other protective layers in your shoes, such as a corn pad. Wearing  gloves. Applying medicine to the skin (topical medicine) to help soften skin in the hardened, thickened areas. Removing layers of dead skin with a file to reduce the size of the corn or callus. Removing the corn or callus with a scalpel or laser. Taking antibiotic medicines, if your corn or callus is infected. Having surgery, if a toe deformity is the cause. Follow these instructions at home:  Take over-the-counter and prescription medicines only as told by your health care provider. If you were prescribed an antibiotic medicine, take it as told by your health care provider. Do not stop taking it even if your condition improves. Wear shoes that fit well. Avoid wearing high-heeled shoes and shoes that are too tight or too loose. Wear any padding, protective layers, gloves, or orthotics as told by your health care provider. Soak your hands or feet. Then use a file or pumice stone to soften your corn or callus. Do this as told by your health care provider. Check your corn or callus every day for signs of infection. Contact a health care provider if: Your symptoms do not improve with treatment. You have redness or swelling that gets worse. Your corn or callus becomes painful. You have fluid, blood, or pus coming from your corn or callus. You have new symptoms. Get help right away if: You develop severe pain with redness. Summary Corns are small areas of thickened skin that form on the top, sides, or tip of a toe. These can be painful. Calluses are areas of thickened skin that can  form anywhere on the body, including the hands, fingers, palms, and soles of the feet. Calluses are usually larger than corns. Corns and calluses are caused by rubbing (friction) or pressure, such as from shoes that are too tight or do not fit properly. Treatment may include wearing padding, protective layers, gloves, or orthotics as told by your health care provider. This information is not intended to replace advice  given to you by your health care provider. Make sure you discuss any questions you have with your health care provider. Document Revised: 04/04/2020 Document Reviewed: 04/04/2020 Elsevier Patient Education  [place].

## 2023-03-10 ENCOUNTER — Encounter: Payer: Self-pay | Admitting: Internal Medicine

## 2023-03-22 ENCOUNTER — Ambulatory Visit: Payer: PPO | Admitting: Podiatry

## 2023-03-22 DIAGNOSIS — L84 Corns and callosities: Secondary | ICD-10-CM | POA: Diagnosis not present

## 2023-03-22 DIAGNOSIS — M2041 Other hammer toe(s) (acquired), right foot: Secondary | ICD-10-CM

## 2023-03-22 NOTE — Patient Instructions (Signed)
More silicone pads can be purchased from:  [email or web address]  

## 2023-03-23 NOTE — Progress Notes (Signed)
  Subjective:  Patient ID: Karen Madden, female    DOB: 09-Dec-1946,  MRN: [id number]  Chief Complaint  Patient presents with   Hammer Toe    right foot callus on the 3rd toe    77 y.o. female presents with the above complaint. History confirmed with patient.  This becomes painful when in shoes.  Objective:  Physical Exam: warm, good capillary refill, no trophic changes or ulcerative lesions, normal DP and PT pulses, normal sensory exam, and right third toe digital contractures semirigid in nature, dorsal PIPJ hyperkeratosis noted  Assessment:   1. Callus of foot   2. Hammertoe of right foot      Plan:  Patient was evaluated and treated and all questions answered.   All symptomatic hyperkeratoses were safely debrided with a sterile #15 blade to patient's level of comfort without incident. We discussed preventative and palliative care of these lesions including supportive and accommodative shoegear, padding, prefabricated and custom molded accommodative orthoses, use of a pumice stone and lotions/creams daily.  Silicone padding dispensed.  We also discussed operative treatment if this fails.   Return if symptoms worsen or fail to improve.

## 2023-04-14 ENCOUNTER — Encounter: Payer: Self-pay | Admitting: Internal Medicine

## 2023-04-15 ENCOUNTER — Other Ambulatory Visit: Payer: Self-pay

## 2023-04-15 ENCOUNTER — Telehealth: Payer: Self-pay | Admitting: Internal Medicine

## 2023-04-15 MED ORDER — EZETIMIBE 10 MG PO TABS: 10.0000 mg | ORAL_TABLET | Freq: Every day | ORAL | 1 refills | Status: DC

## 2023-04-15 NOTE — Telephone Encounter (Signed)
Called patient to schedule Medicare Annual Wellness Visit (AWV). Left message for patient to call back and schedule Medicare Annual Wellness Visit (AWV).  AWV-I: 04/14/2022  Please schedule an appointment at any time with [facility].  If any questions, please contact me at [phone].  Thank you ,  Randon Goldsmith Care Guide [facility] AWV TEAM Direct Dial: [phone]

## 2023-04-19 ENCOUNTER — Telehealth: Payer: Self-pay | Admitting: Internal Medicine

## 2023-04-19 NOTE — Telephone Encounter (Signed)
Contacted Nanako R Mcleish to schedule their annual wellness visit. Appointment made for 04/29/2023.  [facility] Care Guide [facility] AWV TEAM Direct Dial: [phone]

## 2023-04-29 ENCOUNTER — Other Ambulatory Visit: Payer: Self-pay | Admitting: Internal Medicine

## 2023-04-29 ENCOUNTER — Ambulatory Visit (INDEPENDENT_AMBULATORY_CARE_PROVIDER_SITE_OTHER): Payer: PPO

## 2023-04-29 VITALS — Ht 64.0 in | Wt 170.0 lb

## 2023-04-29 DIAGNOSIS — Z0000 Encounter for general adult medical examination without abnormal findings: Secondary | ICD-10-CM

## 2023-04-29 NOTE — Progress Notes (Signed)
I connected with  [name] on 04/29/23 by a audio enabled telemedicine application and verified that I am speaking with the correct person using two identifiers.  Patient Location: Home  Provider Location: Office/Clinic  I discussed the limitations of evaluation and management by telemedicine. The patient expressed understanding and agreed to proceed.  Subjective:   Karen Madden is a 77 y.o. female who presents for Medicare Annual (Subsequent) preventive examination.  Review of Systems     Cardiac Risk Factors include: advanced age (>5men, >85 women);dyslipidemia;family history of premature cardiovascular disease;hypertension     Objective:    Today's Vitals   04/29/23 1122 04/29/23 1123  Weight: 170 lb (77.1 kg)   Height: 5\' 4"  (1.626 m)   PainSc: 7  7   PainLoc: Shoulder    Body mass index is 29.18 kg/m.     04/29/2023   11:26 AM 01/14/2023    1:38 PM 10/13/2022    1:46 PM 08/18/2022   10:21 AM 01/15/2021   10:42 AM 12/22/2020    6:00 PM 12/22/2020    3:27 PM  Advanced Directives  Does Patient Have a Medical Advance Directive? No No No No No No No  Does patient want to make changes to medical advance directive?  No - Patient declined       Would patient like information on creating a medical advance directive? No - Patient declined No - Patient declined No - Patient declined No - Patient declined No - Patient declined No - Patient declined No - Patient declined    Current Medications (verified) Outpatient Encounter Medications as of 04/29/2023  Medication Sig   aspirin 81 MG chewable tablet Chew 81 mg by mouth daily.   atorvastatin (LIPITOR) 10 MG tablet    buPROPion (WELLBUTRIN XL) 300 MG 24 hr tablet TAKE 1 TABLET BY MOUTH EVERY DAY   Cholecalciferol 125 MCG (5000 UT) TABS Take 5,000 Units by mouth daily.   cyanocobalamin (VITAMIN B12) 1000 MCG tablet Take 1 tablet (1,000 mcg total) by mouth daily.   diphenhydrAMINE (SIMPLY SLEEP) 25 MG tablet Take 25 mg by  mouth at bedtime as needed for sleep.   ezetimibe (ZETIA) 10 MG tablet Take 1 tablet (10 mg total) by mouth daily.   famotidine (PEPCID) 20 MG tablet Take 20 mg by mouth 2 (two) times daily.   furosemide (LASIX) 20 MG tablet Take 0.5 tablets (10 mg total) by mouth daily.   gabapentin (NEURONTIN) 400 MG capsule TAKE 1 CAPSULE BY MOUTH AT BEDTIME.   hydrochlorothiazide (HYDRODIURIL) 25 MG tablet Take 25 mg by mouth daily.   KLOR-CON M20 20 MEQ tablet Take 20 mEq by mouth daily.   levothyroxine (SYNTHROID) 75 MCG tablet Take 75 mcg by mouth daily before breakfast.   loperamide (IMODIUM A-D) 2 MG tablet Take 2 mg by mouth 4 (four) times daily as needed for diarrhea or loose stools.   metoprolol succinate (TOPROL-XL) 25 MG 24 hr tablet Take 0.5 tablets (12.5 mg total) by mouth daily. (Patient taking differently: Take 25 mg by mouth daily.)   Multiple Vitamins-Minerals (PRESERVISION AREDS) TABS Take 1 tablet by mouth daily.   mupirocin ointment (BACTROBAN) 2 % Apply 1 Application topically 2 (two) times daily.   nitroGLYCERIN (NITROSTAT) 0.4 MG SL tablet Place 0.4 mg under the tongue every 2 (two) hours as needed for chest pain.   omeprazole (PRILOSEC) 20 MG capsule Take 1 capsule (20 mg total) by mouth 2 (two) times daily before a meal for 14  days.   ondansetron (ZOFRAN-ODT) 4 MG disintegrating tablet Take 1 tablet (4 mg total) by mouth every 8 (eight) hours as needed for nausea or vomiting.   triamcinolone (KENALOG) 0.025 % cream Apply 1 application  topically daily as needed (Rash).   No facility-administered encounter medications on file as of 04/29/2023.    Allergies (verified) Cyclobenzaprine, Morphine and related, and Tetracyclines & related   History: Past Medical History:  Diagnosis Date   Allergy Na   Anginal pain (HCC)    Arthritis NA   Cancer (HCC) 2017   Cataract 2005 & 2007   CHF (congestive heart failure) (HCC)    Coronary artery disease    Dyspnea    GERD (gastroesophageal  reflux disease)    Hypertension    Hypothyroidism    Iron deficiency anemia 08/18/2022   Myocardial infarction (HCC) 12/22/20   Sleep apnea 2023   Past Surgical History:  Procedure Laterality Date   ABDOMINAL HYSTERECTOMY  1991   APPENDECTOMY  2016   BLADDER SURGERY     2011   BUNIONECTOMY     CATARACT EXTRACTION Bilateral    CHOLECYSTECTOMY  2016   COLON SURGERY  2016 & 2017   CORONARY/GRAFT ACUTE MI REVASCULARIZATION N/A 12/22/2020   Procedure: Coronary/Graft Acute MI Revascularization;  Surgeon: Alwyn Pea, MD;  Location: [facility] INVASIVE CV LAB;  Service: Cardiovascular;  Laterality: N/A;   HERNIA REPAIR  2021   LEFT HEART CATH AND CORONARY ANGIOGRAPHY N/A 12/22/2020   Procedure: LEFT HEART CATH AND CORONARY ANGIOGRAPHY;  Surgeon: Alwyn Pea, MD;  Location: [facility] INVASIVE CV LAB;  Service: Cardiovascular;  Laterality: N/A;   LEFT HEART CATH AND CORONARY ANGIOGRAPHY N/A 07/21/2021   Procedure: LEFT HEART CATH AND CORONARY ANGIOGRAPHY with possible Intervention;  Surgeon: Alwyn Pea, MD;  Location: [facility] INVASIVE CV LAB;  Service: Cardiovascular;  Laterality: N/A;   Family History  Problem Relation Age of Onset   Heart disease Mother    Hypertension Mother    Obesity Mother    Vision loss Mother    Alcohol abuse Father    Cancer Brother    Diabetes Sister    Varicose Veins Daughter    Social History   Socioeconomic History   Marital status: Married    Spouse name: Not on file   Number of children: Not on file   Years of education: Not on file   Highest education level: Not on file  Occupational History   Not on file  Tobacco Use   Smoking status: Never   Smokeless tobacco: Never  Vaping Use   Vaping Use: Never used  Substance and Sexual Activity   Alcohol use: Not Currently   Drug use: Never   Sexual activity: Yes  Other Topics Concern   Not on file  Social History Narrative   Not on file   Social Determinants of Health   Financial  Resource Strain: Low Risk  (04/29/2023)   Overall Financial Resource Strain (CARDIA)    Difficulty of Paying Living Expenses: Not hard at all  Food Insecurity: No Food Insecurity (04/29/2023)   Hunger Vital Sign    Worried About Running Out of Food in the Last Year: Never true    Ran Out of Food in the Last Year: Never true  Transportation Needs: No Transportation Needs (04/29/2023)   PRAPARE - Administrator, Civil Service (Medical): No    Lack of Transportation (Non-Medical): No  Physical Activity: Insufficiently Active (04/29/2023)  Exercise Vital Sign    Days of Exercise per Week: 3 days    Minutes of Exercise per Session: 30 min  Stress: No Stress Concern Present (04/29/2023)   [employer] of Occupational Health - Occupational Stress Questionnaire    Feeling of Stress : Only a little  Social Connections: Unknown (04/29/2023)   Social Connection and Isolation Panel [NHANES]    Frequency of Communication with Friends and Family: More than three times a week    Frequency of Social Gatherings with Friends and Family: More than three times a week    Attends Religious Services: Not on Marketing executive or Organizations: Yes    Attends Engineer, structural: More than 4 times per year    Marital Status: Married    Tobacco Counseling Counseling given: Not Answered   Clinical Intake:  Pre-visit preparation completed: Yes  Pain : 0-10 Pain Score: 7  Pain Type: Chronic pain Pain Location: Shoulder Pain Orientation: Right     BMI - recorded: 29.18 Nutritional Status: BMI 25 -29 Overweight Nutritional Risks: None Diabetes: No  How often do you need to have someone help you when you read instructions, pamphlets, or other written materials from your doctor or pharmacy?: 1 - Never What is the last grade level you completed in school?: [employer]  Diabetic? No  Interpreter Needed?: No  Information entered by :: Susie Cassette,  LPN.   Activities of Daily Living    04/29/2023   11:38 AM 04/26/2023    1:12 PM  In your present state of health, do you have any difficulty performing the following activities:  Hearing? 0 0  Vision? 0 0  Difficulty concentrating or making decisions? 0 0  Walking or climbing stairs? 0 0  Dressing or bathing? 0 0  Doing errands, shopping? 0 0  Preparing Food and eating ? N N  Using the Toilet? N N  In the past six months, have you accidently leaked urine? N N  Do you have problems with loss of bowel control? N N  Managing your Medications? N N  Managing your Finances? N N  Housekeeping or managing your Housekeeping? N N    Patient Care Team: Margarita Mail, DO as PCP - General (Internal Medicine) [facility] as Consulting Physician (Ophthalmology)  Indicate any recent Medical Services you may have received from other than [facility] providers in the past year (date may be approximate).     Assessment:   This is a routine wellness examination for Rickell.  Hearing/Vision screen Hearing Screening - Comments:: Denies hearing difficulties   Vision Screening - Comments:: Wear reading glasses - up to date with routine eye exams with [facility]   Dietary issues and exercise activities discussed: Current Exercise Habits: Home exercise routine, Type of exercise: walking, Time (Minutes): 30, Frequency (Times/Week): 3, Weekly Exercise (Minutes/Week): 90, Intensity: Moderate, Exercise limited by: orthopedic condition(s);cardiac condition(s)   Goals Addressed             This Visit's Progress    My goal for 2024 is to lose 10-20 pounds by continue to walk and stay active.        Depression Screen    04/29/2023   11:38 AM 03/09/2023    3:13 PM 01/21/2023   11:17 AM 11/09/2022    1:56 PM 09/08/2022    3:30 PM 08/13/2022    9:40 AM 03/31/2021    2:21 PM  PHQ 2/9 Scores  PHQ -  2 Score 0 0 0 0 0 0 4  PHQ- 9 Score 0 0 0  0 0 9    Fall Risk    04/29/2023   11:38 AM  04/26/2023    1:12 PM 03/09/2023    3:13 PM 01/21/2023   11:17 AM 11/09/2022    1:55 PM  Fall Risk   Falls in the past year? 0 0 0 0 1  Number falls in past yr: 0 0 0 0 1  Injury with Fall? 0 0 0 0 1  Risk for fall due to : No Fall Risks    History of fall(s)  Follow up Falls prevention discussed    Falls evaluation completed    FALL RISK PREVENTION PERTAINING TO THE HOME:  Any stairs in or around the home? No  If so, are there any without handrails? No  Home free of loose throw rugs in walkways, pet beds, electrical cords, etc? Yes  Adequate lighting in your home to reduce risk of falls? Yes   ASSISTIVE DEVICES UTILIZED TO PREVENT FALLS:  Life alert? No  Use of a cane, walker or w/c? No  Grab bars in the bathroom? Yes  Shower chair or bench in shower? Yes  Elevated toilet seat or a handicapped toilet? No   TIMED UP AND GO:  Was the test performed? No . Telephonic Visit  Cognitive Function:        04/29/2023   11:40 AM  6CIT Screen  What Year? 0 points  What month? 0 points  What time? 0 points  Count back from 20 0 points  Months in reverse 0 points  Repeat phrase 0 points  Total Score 0 points    Immunizations Immunization History  Administered Date(s) Administered   Moderna Sars-Covid-2 Vaccination 02/17/2020, 03/16/2020   Pneumococcal Polysaccharide-23 06/09/2019   Zoster Recombinat (Shingrix) 12/30/2010   Zoster, Live 12/30/2010    TDAP status: Discontinued  Flu Vaccine status: Declined, Education has been provided regarding the importance of this vaccine but patient still declined. Advised may receive this vaccine at local pharmacy or Health Dept. Aware to provide a copy of the vaccination record if obtained from local pharmacy or Health Dept. Verbalized acceptance and understanding.  Pneumococcal vaccine status: Declined,  Education has been provided regarding the importance of this vaccine but patient still declined. Advised may receive this vaccine at  local pharmacy or Health Dept. Aware to provide a copy of the vaccination record if obtained from local pharmacy or Health Dept. Verbalized acceptance and understanding.   Covid-19 vaccine status: Completed vaccines  Qualifies for Shingles Vaccine? Yes   Zostavax completed No   Shingrix Completed?: No.    Education has been provided regarding the importance of this vaccine. Patient has been advised to call insurance company to determine out of pocket expense if they have not yet received this vaccine. Advised may also receive vaccine at local pharmacy or Health Dept. Verbalized acceptance and understanding.  Screening Tests Health Maintenance  Topic Date Due   COVID-19 Vaccine (3 - Moderna risk series) 04/13/2020   Pneumonia Vaccine 22+ Years old (2 of 2 - PCV) 06/08/2020   Zoster Vaccines- Shingrix (2 of 2) 06/09/2023 (Originally 02/24/2011)   INFLUENZA VACCINE  07/22/2023   Medicare Annual Wellness (AWV)  04/28/2024   DEXA SCAN  Completed   Hepatitis C Screening  Completed   HPV VACCINES  Aged Out   DTaP/Tdap/Td  Discontinued    Health Maintenance  Health Maintenance Due  Topic  Date Due   COVID-19 Vaccine (3 - Moderna risk series) 04/13/2020   Pneumonia Vaccine 58+ Years old (2 of 2 - PCV) 06/08/2020    Colorectal cancer screening: No record on file.  Mammogram status: Completed 03/09/2022. Repeat every year  Bone Density status: Com[facility] at [facility]. Results reflect: Bone density results: NORMAL. Repeat every 5 years.  Lung Cancer Screening: (Low Dose CT Chest recommended if Age 45-80 years, 30 pack-year currently smoking OR have quit w/in 15years.) does not qualify.   Lung Cancer Screening Referral: No  Additional Screening:  Hepatitis C Screening: does qualify; Completed 07/03/2021  Vision Screening: Recommended annual ophthalmology exams for early detection of glaucoma and other disorders of the eye. Is the patient up to date with their annual eye exam?  Yes  Who is  the provider or what is the name of the office in which the patient attends annual eye exams? [facility] If pt is not established with a provider, would they like to be referred to a provider to establish care? No .   Dental Screening: Recommended annual dental exams for proper oral hygiene  Community Resource Referral / Chronic Care Management: CRR required this visit?  No   CCM required this visit?  No      Plan:     I have personally reviewed and noted the following in the patient's chart:   Medical and social history Use of alcohol, tobacco or illicit drugs  Current medications and supplements including opioid prescriptions. Patient is not currently taking opioid prescriptions. Functional ability and status Nutritional status Physical activity Advanced directives List of other physicians Hospitalizations, surgeries, and ER visits in previous 12 months Vitals Screenings to include cognitive, depression, and falls Referrals and appointments  In addition, I have reviewed and discussed with patient certain preventive protocols, quality metrics, and best practice recommendations. A written personalized care plan for preventive services as well as general preventive health recommendations were provided to patient.     Mickeal Needy, LPN   07/22/9561   Nurse Notes:  Normal cognitive status assessed by direct observation via telephone visit by this Nurse Health Advisor. No abnormalities found.

## 2023-04-29 NOTE — Patient Instructions (Signed)
Karen Madden , Thank you for taking time to come for your Medicare Wellness Visit. I appreciate your ongoing commitment to your health goals. Please review the following plan we discussed and let me know if I can assist you in the future.   These are the goals we discussed:  Goals      My goal for 2024 is to lose 10-20 pounds by continue to walk and stay active.        This is a list of the screening recommended for you and due dates:  Health Maintenance  Topic Date Due   COVID-19 Vaccine (3 - Moderna risk series) 04/13/2020   Pneumonia Vaccine (2 of 2 - PCV) 06/08/2020   Zoster (Shingles) Vaccine (2 of 2) 06/09/2023*   Flu Shot  07/22/2023   Medicare Annual Wellness Visit  04/28/2024   DEXA scan (bone density measurement)  Completed   Hepatitis C Screening: USPSTF Recommendation to screen - Ages 48-79 yo.  Completed   HPV Vaccine  Aged Out   DTaP/Tdap/Td vaccine  Discontinued  *Topic was postponed. The date shown is not the original due date.    Advanced directives: No  Conditions/risks identified: Yes  Next appointment: Follow up in one year for your annual wellness visit.   Preventive Care 77 Years and Older, Female Preventive care refers to lifestyle choices and visits with your health care provider that can promote health and wellness. What does preventive care include? A yearly physical exam. This is also called an annual well check. Dental exams once or twice a year. Routine eye exams. Ask your health care provider how often you should have your eyes checked. Personal lifestyle choices, including: Daily care of your teeth and gums. Regular physical activity. Eating a healthy diet. Avoiding tobacco and drug use. Limiting alcohol use. Practicing safe sex. Taking low-dose aspirin every day. Taking vitamin and mineral supplements as recommended by your health care provider. What happens during an annual well check? The services and screenings done by your health care  provider during your annual well check will depend on your age, overall health, lifestyle risk factors, and family history of disease. Counseling  Your health care provider may ask you questions about your: Alcohol use. Tobacco use. Drug use. Emotional well-being. Home and relationship well-being. Sexual activity. Eating habits. History of falls. Memory and ability to understand (cognition). Work and work Astronomer. Reproductive health. Screening  You may have the following tests or measurements: Height, weight, and BMI. Blood pressure. Lipid and cholesterol levels. These may be checked every 5 years, or more frequently if you are over 85 years old. Skin check. Lung cancer screening. You may have this screening every year starting at age 25 if you have a 30-pack-year history of smoking and currently smoke or have quit within the past 15 years. Fecal occult blood test (FOBT) of the stool. You may have this test every year starting at age 73. Flexible sigmoidoscopy or colonoscopy. You may have a sigmoidoscopy every 5 years or a colonoscopy every 10 years starting at age 44. Hepatitis C blood test. Hepatitis B blood test. Sexually transmitted disease (STD) testing. Diabetes screening. This is done by checking your blood sugar (glucose) after you have not eaten for a while (fasting). You may have this done every 1-3 years. Bone density scan. This is done to screen for osteoporosis. You may have this done starting at age 25. Mammogram. This may be done every 1-2 years. Talk to your health care provider about  how often you should have regular mammograms. Talk with your health care provider about your test results, treatment options, and if necessary, the need for more tests. Vaccines  Your health care provider may recommend certain vaccines, such as: Influenza vaccine. This is recommended every year. Tetanus, diphtheria, and acellular pertussis (Tdap, Td) vaccine. You may need a Td  booster every 10 years. Zoster vaccine. You may need this after age 98. Pneumococcal 13-valent conjugate (PCV13) vaccine. One dose is recommended after age 72. Pneumococcal polysaccharide (PPSV23) vaccine. One dose is recommended after age 59. Talk to your health care provider about which screenings and vaccines you need and how often you need them. This information is not intended to replace advice given to you by your health care provider. Make sure you discuss any questions you have with your health care provider. Document Released: 01/03/2016 Document Revised: 08/26/2016 Document Reviewed: 10/08/2015 Elsevier Interactive Patient Education  2017 [place] Prevention in the Home Falls can cause injuries. They can happen to people of all ages. There are many things you can do to make your home safe and to help prevent falls. What can I do on the outside of my home? Regularly fix the edges of walkways and driveways and fix any cracks. Remove anything that might make you trip as you walk through a door, such as a raised step or threshold. Trim any bushes or trees on the path to your home. Use bright outdoor lighting. Clear any walking paths of anything that might make someone trip, such as rocks or tools. Regularly check to see if handrails are loose or broken. Make sure that both sides of any steps have handrails. Any raised decks and porches should have guardrails on the edges. Have any leaves, snow, or ice cleared regularly. Use sand or salt on walking paths during winter. Clean up any spills in your garage right away. This includes oil or grease spills. What can I do in the bathroom? Use night lights. Install grab bars by the toilet and in the tub and shower. Do not use towel bars as grab bars. Use non-skid mats or decals in the tub or shower. If you need to sit down in the shower, use a plastic, non-slip stool. Keep the floor dry. Clean up any water that spills on the floor  as soon as it happens. Remove soap buildup in the tub or shower regularly. Attach bath mats securely with double-sided non-slip rug tape. Do not have throw rugs and other things on the floor that can make you trip. What can I do in the bedroom? Use night lights. Make sure that you have a light by your bed that is easy to reach. Do not use any sheets or blankets that are too big for your bed. They should not hang down onto the floor. Have a firm chair that has side arms. You can use this for support while you get dressed. Do not have throw rugs and other things on the floor that can make you trip. What can I do in the kitchen? Clean up any spills right away. Avoid walking on wet floors. Keep items that you use a lot in easy-to-reach places. If you need to reach something above you, use a strong step stool that has a grab bar. Keep electrical cords out of the way. Do not use floor polish or wax that makes floors slippery. If you must use wax, use non-skid floor wax. Do not have throw rugs and other  things on the floor that can make you trip. What can I do with my stairs? Do not leave any items on the stairs. Make sure that there are handrails on both sides of the stairs and use them. Fix handrails that are broken or loose. Make sure that handrails are as long as the stairways. Check any carpeting to make sure that it is firmly attached to the stairs. Fix any carpet that is loose or worn. Avoid having throw rugs at the top or bottom of the stairs. If you do have throw rugs, attach them to the floor with carpet tape. Make sure that you have a light switch at the top of the stairs and the bottom of the stairs. If you do not have them, ask someone to add them for you. What else can I do to help prevent falls? Wear shoes that: Do not have high heels. Have rubber bottoms. Are comfortable and fit you well. Are closed at the toe. Do not wear sandals. If you use a stepladder: Make sure that it is  fully opened. Do not climb a closed stepladder. Make sure that both sides of the stepladder are locked into place. Ask someone to hold it for you, if possible. Clearly mark and make sure that you can see: Any grab bars or handrails. First and last steps. Where the edge of each step is. Use tools that help you move around (mobility aids) if they are needed. These include: Canes. Walkers. Scooters. Crutches. Turn on the lights when you go into a dark area. Replace any light bulbs as soon as they burn out. Set up your furniture so you have a clear path. Avoid moving your furniture around. If any of your floors are uneven, fix them. If there are any pets around you, be aware of where they are. Review your medicines with your doctor. Some medicines can make you feel dizzy. This can increase your chance of falling. Ask your doctor what other things that you can do to help prevent falls. This information is not intended to replace advice given to you by your health care provider. Make sure you discuss any questions you have with your health care provider. Document Released: 10/03/2009 Document Revised: 05/14/2016 Document Reviewed: 01/11/2015 Elsevier Interactive Patient Education  2017 [employer].

## 2023-04-29 NOTE — Telephone Encounter (Signed)
Requested medication (s) are due for refill today - unsure  Requested medication (s) are on the active medication list -yes  Future visit scheduled -no  Last refill: 12/23/20  Notes to clinic: listed as historical provider - sent for review   Requested Prescriptions  Pending Prescriptions Disp Refills   levothyroxine (SYNTHROID) 75 MCG tablet [Pharmacy Med Name: LEVOTHYROXINE 75 MCG TABLET] 90 tablet     Sig: TAKE 1 TABLET BY MOUTH EVERY DAY     Endocrinology:  Hypothyroid Agents Passed - 04/29/2023  2:56 PM      Passed - TSH in normal range and within 360 days    TSH  Date Value Ref Range Status  08/13/2022 2.13 0.40 - 4.50 mIU/L Final         Passed - Valid encounter within last 12 months    Recent Outpatient Visits           1 month ago Callus of foot   [facility] Margarita Mail, DO   3 months ago Acute cystitis without hematuria   [facility] Margarita Mail, DO   5 months ago Herpes zoster without complication   [facility] Berniece Salines, FNP   7 months ago Primary hypertension   [facility] Margarita Mail, DO   8 months ago Primary hypertension   [facility] Margarita Mail, DO       Future Appointments             In 4 months Margarita Mail, DO [place] [facility], [facility]               Requested Prescriptions  Pending Prescriptions Disp Refills   levothyroxine (SYNTHROID) 75 MCG tablet [Pharmacy Med Name: LEVOTHYROXINE 75 MCG TABLET] 90 tablet     Sig: TAKE 1 TABLET BY MOUTH EVERY DAY     Endocrinology:  Hypothyroid Agents Passed - 04/29/2023  2:56 PM      Passed - TSH in normal range and within 360 days    TSH  Date Value Ref Range Status  08/13/2022 2.13 0.40 - 4.50 mIU/L Final         Passed - Valid encounter within last 12 months    Recent Outpatient Visits           1  month ago Callus of foot   [facility] Margarita Mail, DO   3 months ago Acute cystitis without hematuria   [facility] Margarita Mail, DO   5 months ago Herpes zoster without complication   [facility] Berniece Salines, FNP   7 months ago Primary hypertension   [facility] Margarita Mail, DO   8 months ago Primary hypertension   [facility] Margarita Mail, DO       Future Appointments             In 4 months Margarita Mail, DO [facility]

## 2023-05-03 DIAGNOSIS — M19011 Primary osteoarthritis, right shoulder: Secondary | ICD-10-CM | POA: Diagnosis not present

## 2023-05-05 ENCOUNTER — Telehealth: Payer: Self-pay | Admitting: Internal Medicine

## 2023-05-05 NOTE — Telephone Encounter (Signed)
Called patient to schedule Medicare Annual Wellness Visit (AWV). Left message for patient to call back and schedule Medicare Annual Wellness Visit (AWV).  Last date of AWV: 04/29/2023  Please schedule an appointment at any time with [facility].  If any questions, please contact me at [phone].  Thank you ,  Randon Goldsmith Care Guide [facility] AWV TEAM Direct Dial: [phone]

## 2023-05-11 ENCOUNTER — Encounter: Payer: Self-pay | Admitting: Internal Medicine

## 2023-05-11 ENCOUNTER — Ambulatory Visit (INDEPENDENT_AMBULATORY_CARE_PROVIDER_SITE_OTHER): Payer: PPO | Admitting: Physician Assistant

## 2023-05-11 VITALS — BP 124/82 | HR 90 | Temp 98.1°F | Resp 18 | Ht 64.0 in | Wt 168.2 lb

## 2023-05-11 DIAGNOSIS — R350 Frequency of micturition: Secondary | ICD-10-CM | POA: Diagnosis not present

## 2023-05-11 DIAGNOSIS — R309 Painful micturition, unspecified: Secondary | ICD-10-CM | POA: Diagnosis not present

## 2023-05-11 DIAGNOSIS — N390 Urinary tract infection, site not specified: Secondary | ICD-10-CM

## 2023-05-11 LAB — POCT URINALYSIS DIPSTICK
Bilirubin, UA: NEGATIVE
Blood, UA: NEGATIVE
Glucose, UA: NEGATIVE
Ketones, UA: NEGATIVE
Nitrite, UA: NEGATIVE
Odor: NORMAL
Protein, UA: NEGATIVE
Spec Grav, UA: 1.02 (ref 1.010–1.025)
Urobilinogen, UA: 0.2 E.U./dL
pH, UA: 5.5 (ref 5.0–8.0)

## 2023-05-11 MED ORDER — SULFAMETHOXAZOLE-TRIMETHOPRIM 800-160 MG PO TABS
1.0000 | ORAL_TABLET | Freq: Two times a day (BID) | ORAL | 0 refills | Status: DC
Start: 2023-05-11 — End: 2023-08-19

## 2023-05-11 NOTE — Telephone Encounter (Signed)
Separate encounter forward to [facility] for scheduling

## 2023-05-11 NOTE — Progress Notes (Unsigned)
Acute Office Visit   Patient: Karen Madden   DOB: 1946/12/12   77 y.o. Female  MRN: [id number] Visit Date: 05/11/2023  Today's healthcare provider: Oswaldo Conroy Symia Herdt, PA-C  Introduced myself to the patient as a Secondary school teacher and provided education on APPs in clinical practice.    Chief Complaint  Patient presents with   Urinary Tract Infection    Urinary frequency and some pain   Subjective    HPI HPI     Urinary Tract Infection    Additional comments: Urinary frequency and some pain      Last edited by Karen Madden, CMA on 05/11/2023  2:14 PM.       She reports she has concerns for a UTI She thinks her symptoms started about a week or so ago She states she is having increased urinary frequency, itching sensation,  She denies dysuria, urinary hesitancy, bladder pressure, sensation of incomplete voiding  She reports feeling hot and cold last night- stating she couldn't seem to get her temperature right  Interventions: nothing    Medications: Outpatient Medications Prior to Visit  Medication Sig   aspirin 81 MG chewable tablet Chew 81 mg by mouth daily.   atorvastatin (LIPITOR) 10 MG tablet    buPROPion (WELLBUTRIN XL) 300 MG 24 hr tablet TAKE 1 TABLET BY MOUTH EVERY DAY   Cholecalciferol 125 MCG (5000 UT) TABS Take 5,000 Units by mouth daily.   cyanocobalamin (VITAMIN B12) 1000 MCG tablet Take 1 tablet (1,000 mcg total) by mouth daily.   diphenhydrAMINE (SIMPLY SLEEP) 25 MG tablet Take 25 mg by mouth at bedtime as needed for sleep.   ezetimibe (ZETIA) 10 MG tablet Take 1 tablet (10 mg total) by mouth daily.   famotidine (PEPCID) 20 MG tablet Take 20 mg by mouth 2 (two) times daily.   furosemide (LASIX) 20 MG tablet Take 0.5 tablets (10 mg total) by mouth daily.   gabapentin (NEURONTIN) 400 MG capsule TAKE 1 CAPSULE BY MOUTH AT BEDTIME.   hydrochlorothiazide (HYDRODIURIL) 25 MG tablet Take 25 mg by mouth daily.   KLOR-CON M20 20 MEQ tablet Take 20 mEq by mouth daily.    levothyroxine (SYNTHROID) 75 MCG tablet TAKE 1 TABLET BY MOUTH EVERY DAY   loperamide (IMODIUM A-D) 2 MG tablet Take 2 mg by mouth 4 (four) times daily as needed for diarrhea or loose stools.   metoprolol succinate (TOPROL-XL) 25 MG 24 hr tablet Take 0.5 tablets (12.5 mg total) by mouth daily. (Patient taking differently: Take 25 mg by mouth daily.)   Multiple Vitamins-Minerals (PRESERVISION AREDS) TABS Take 1 tablet by mouth daily.   mupirocin ointment (BACTROBAN) 2 % Apply 1 Application topically 2 (two) times daily.   nitroGLYCERIN (NITROSTAT) 0.4 MG SL tablet Place 0.4 mg under the tongue every 2 (two) hours as needed for chest pain.   omeprazole (PRILOSEC) 20 MG capsule Take 1 capsule (20 mg total) by mouth 2 (two) times daily before a meal for 14 days.   ondansetron (ZOFRAN-ODT) 4 MG disintegrating tablet Take 1 tablet (4 mg total) by mouth every 8 (eight) hours as needed for nausea or vomiting.   triamcinolone (KENALOG) 0.025 % cream Apply 1 application  topically daily as needed (Rash).   No facility-administered medications prior to visit.    Review of Systems  Constitutional:  Negative for chills, fatigue and fever.  Genitourinary:  Positive for frequency and urgency. Negative for decreased urine volume, difficulty urinating, dysuria,  enuresis, flank pain, vaginal bleeding, vaginal discharge and vaginal pain.    {Labs  Heme  Chem  Endocrine  Serology  Results Review (optional):23779}   Objective    BP 124/82   Pulse 90   Temp 98.1 F (36.7 C)   Resp 18   Ht 5\' 4"  (1.626 m)   Wt 168 lb 3.2 oz (76.3 kg)   SpO2 96%   BMI 28.87 kg/m  {Show previous vital signs (optional):23777}  Physical Exam Vitals reviewed.  Constitutional:      General: She is awake.     Appearance: Normal appearance. She is well-developed.  HENT:     Head: Normocephalic and atraumatic.  Pulmonary:     Effort: Pulmonary effort is normal.  Skin:    General: Skin is warm.  Neurological:      General: No focal deficit present.     Mental Status: She is alert and oriented to person, place, and time.  Psychiatric:        Mood and Affect: Mood normal.        Behavior: Behavior normal. Behavior is cooperative.        Thought Content: Thought content normal.       Results for orders placed or performed in visit on 05/11/23  POCT urinalysis dipstick  Result Value Ref Range   Color, UA light yellow    Clarity, UA clear    Glucose, UA Negative Negative   Bilirubin, UA neg    Ketones, UA neg    Spec Grav, UA 1.020 1.010 - 1.025   Blood, UA neg    pH, UA 5.5 5.0 - 8.0   Protein, UA Negative Negative   Urobilinogen, UA 0.2 0.2 or 1.0 E.U./dL   Nitrite, UA neg    Leukocytes, UA Large (3+) (A) Negative   Appearance clear    Odor normal     Assessment & Plan      No follow-ups on file.      Problem List Items Addressed This Visit   None Visit Diagnoses     Urinary tract infection without hematuria, site unspecified    -  Primary   Relevant Medications   sulfamethoxazole-trimethoprim (BACTRIM DS) 800-160 MG tablet   Urinary frequency       Relevant Orders   POCT urinalysis dipstick (Completed)   Urine Culture   Urinary pain       Relevant Orders   POCT urinalysis dipstick (Completed)   Urine Culture        No follow-ups on file.   I, Karen Sebree E Shyhiem Beeney, PA-C, have reviewed all documentation for this visit. The documentation on 05/11/23 for the exam, diagnosis, procedures, and orders are all accurate and complete.   Karen Madden, MHS, PA-C [facility]

## 2023-05-13 LAB — URINE CULTURE: SPECIMEN QUALITY:: ADEQUATE

## 2023-05-14 LAB — URINE CULTURE: MICRO NUMBER:: 14985269

## 2023-05-14 NOTE — Progress Notes (Signed)
Your urine culture has returned and demonstrate infection with Proteus mirabilis.  This is the same organism that caused your previous UTI.  The medication that you were prescribed, Bactrim, is sufficient to manage this.  Please continue to take as directed and finish the entire course of the medication to ensure that the UTI  is adequately treated.  Please let us know if you have other questions or concerns, remember to stay well-hydrated and avoid holding your  urine for prolonged periods of time.

## 2023-05-21 ENCOUNTER — Other Ambulatory Visit: Payer: Self-pay | Admitting: Internal Medicine

## 2023-05-21 DIAGNOSIS — F330 Major depressive disorder, recurrent, mild: Secondary | ICD-10-CM

## 2023-05-21 NOTE — Telephone Encounter (Signed)
Requested medications are due for refill today.  no  Requested medications are on the active medications list.  yes  Last refill. 02/26/2023 #30 5 rf  Future visit scheduled.   yes  Notes to clinic.  Requesting a 90 day rx.    Requested Prescriptions  Pending Prescriptions Disp Refills   buPROPion (WELLBUTRIN XL) 300 MG 24 hr tablet [Pharmacy Med Name: BUPROPION HCL XL 300 MG TABLET] 90 tablet 2    Sig: TAKE 1 TABLET BY MOUTH EVERY DAY     Psychiatry: Antidepressants - bupropion Failed - 05/21/2023  2:32 PM      Failed - Cr in normal range and within 360 days    Creat  Date Value Ref Range Status  08/13/2022 1.40 (H) 0.60 - 1.00 mg/dL Final         Passed - AST in normal range and within 360 days    AST  Date Value Ref Range Status  08/13/2022 19 10 - 35 U/L Final         Passed - ALT in normal range and within 360 days    ALT  Date Value Ref Range Status  08/13/2022 19 6 - 29 U/L Final         Passed - Completed PHQ-2 or PHQ-9 in the last 360 days      Passed - Last BP in normal range    BP Readings from Last 1 Encounters:  05/11/23 124/82         Passed - Valid encounter within last 6 months    Recent Outpatient Visits           1 week ago Urinary tract infection without hematuria, site unspecified   [facility] Mecum, Oswaldo Conroy, PA-C   2 months ago Callus of foot   [facility] Margarita Mail, DO   4 months ago Acute cystitis without hematuria   [facility] Margarita Mail, DO   6 months ago Herpes zoster without complication   [facility] Berniece Salines, FNP   8 months ago Primary hypertension   [facility] Margarita Mail, DO       Future Appointments             In 4 months Margarita Mail, DO [facility]

## 2023-05-26 DIAGNOSIS — M19011 Primary osteoarthritis, right shoulder: Secondary | ICD-10-CM | POA: Diagnosis not present

## 2023-05-31 ENCOUNTER — Encounter: Payer: Self-pay | Admitting: Internal Medicine

## 2023-05-31 ENCOUNTER — Inpatient Hospital Stay: Payer: PPO | Attending: Internal Medicine

## 2023-05-31 ENCOUNTER — Inpatient Hospital Stay: Payer: PPO | Admitting: Internal Medicine

## 2023-05-31 VITALS — BP 154/77 | HR 65 | Temp 98.6°F | Wt 172.4 lb

## 2023-05-31 DIAGNOSIS — D508 Other iron deficiency anemias: Secondary | ICD-10-CM

## 2023-05-31 DIAGNOSIS — I509 Heart failure, unspecified: Secondary | ICD-10-CM | POA: Diagnosis not present

## 2023-05-31 DIAGNOSIS — D509 Iron deficiency anemia, unspecified: Secondary | ICD-10-CM | POA: Insufficient documentation

## 2023-05-31 DIAGNOSIS — Z87440 Personal history of urinary (tract) infections: Secondary | ICD-10-CM | POA: Insufficient documentation

## 2023-05-31 DIAGNOSIS — I110 Hypertensive heart disease with heart failure: Secondary | ICD-10-CM | POA: Diagnosis not present

## 2023-05-31 DIAGNOSIS — I252 Old myocardial infarction: Secondary | ICD-10-CM | POA: Insufficient documentation

## 2023-05-31 DIAGNOSIS — Z85048 Personal history of other malignant neoplasm of rectum, rectosigmoid junction, and anus: Secondary | ICD-10-CM | POA: Diagnosis not present

## 2023-05-31 DIAGNOSIS — E039 Hypothyroidism, unspecified: Secondary | ICD-10-CM | POA: Insufficient documentation

## 2023-05-31 DIAGNOSIS — M25511 Pain in right shoulder: Secondary | ICD-10-CM | POA: Insufficient documentation

## 2023-05-31 DIAGNOSIS — E538 Deficiency of other specified B group vitamins: Secondary | ICD-10-CM | POA: Diagnosis not present

## 2023-05-31 DIAGNOSIS — Z7982 Long term (current) use of aspirin: Secondary | ICD-10-CM | POA: Diagnosis not present

## 2023-05-31 DIAGNOSIS — Z79890 Hormone replacement therapy (postmenopausal): Secondary | ICD-10-CM | POA: Diagnosis not present

## 2023-05-31 DIAGNOSIS — Z0000 Encounter for general adult medical examination without abnormal findings: Secondary | ICD-10-CM

## 2023-05-31 DIAGNOSIS — G4730 Sleep apnea, unspecified: Secondary | ICD-10-CM | POA: Insufficient documentation

## 2023-05-31 DIAGNOSIS — Z79899 Other long term (current) drug therapy: Secondary | ICD-10-CM | POA: Diagnosis not present

## 2023-05-31 DIAGNOSIS — K219 Gastro-esophageal reflux disease without esophagitis: Secondary | ICD-10-CM | POA: Insufficient documentation

## 2023-05-31 DIAGNOSIS — Z1231 Encounter for screening mammogram for malignant neoplasm of breast: Secondary | ICD-10-CM | POA: Diagnosis not present

## 2023-05-31 DIAGNOSIS — I10 Essential (primary) hypertension: Secondary | ICD-10-CM | POA: Diagnosis not present

## 2023-05-31 LAB — IRON AND TIBC
Iron: 132 ug/dL (ref 28–170)
Saturation Ratios: 30 % (ref 10.4–31.8)
TIBC: 445 ug/dL (ref 250–450)
UIBC: 313 ug/dL

## 2023-05-31 LAB — CBC WITH DIFFERENTIAL/PLATELET
Abs Immature Granulocytes: 0.03 10*3/uL (ref 0.00–0.07)
Basophils Absolute: 0 10*3/uL (ref 0.0–0.1)
Basophils Relative: 1 %
Eosinophils Absolute: 0.1 10*3/uL (ref 0.0–0.5)
Eosinophils Relative: 3 %
HCT: 38.9 % (ref 36.0–46.0)
Hemoglobin: 13.3 g/dL (ref 12.0–15.0)
Immature Granulocytes: 1 %
Lymphocytes Relative: 17 %
Lymphs Abs: 0.8 10*3/uL (ref 0.7–4.0)
MCH: 30.9 pg (ref 26.0–34.0)
MCHC: 34.2 g/dL (ref 30.0–36.0)
MCV: 90.3 fL (ref 80.0–100.0)
Monocytes Absolute: 0.5 10*3/uL (ref 0.1–1.0)
Monocytes Relative: 10 %
Neutro Abs: 3.2 10*3/uL (ref 1.7–7.7)
Neutrophils Relative %: 68 %
Platelets: 237 10*3/uL (ref 150–400)
RBC: 4.31 MIL/uL (ref 3.87–5.11)
RDW: 13.3 % (ref 11.5–15.5)
WBC: 4.6 10*3/uL (ref 4.0–10.5)
nRBC: 0 % (ref 0.0–0.2)

## 2023-05-31 LAB — VITAMIN B12: Vitamin B-12: 1110 pg/mL — ABNORMAL HIGH (ref 180–914)

## 2023-05-31 LAB — FERRITIN: Ferritin: 115 ng/mL (ref 11–307)

## 2023-05-31 NOTE — Progress Notes (Signed)
Patient had an MRI recently through [facility], and I didn't see it in her chart. She is on antibiotics right now for her UTI.

## 2023-05-31 NOTE — Progress Notes (Signed)
Karen Madden  Telephone:([phone]) [phone] Fax:([phone]) [phone]  ID: Karen Madden OB: 11/18/1946  MR#: [id number]  AOZ#:[id number]  Patient Care Team: Margarita Mail, DO as PCP - General (Internal Medicine) [facility] as Consulting Physician (Ophthalmology)    HPI: Karen Madden is a 77 y.o. female with pmh of CAD, HTN, hypothyroidism, OSA and colon adenocarcinoma pT3N0 status post resection on 10/12/2016 by Dr. Judeth Cornfield at [facility] did not receive adjuvant treatment was seen for iron deficiency anemia.   Patient reported some burning sensation in the epigastric region when eating tomato sauce which is new for her.  She denies any blood in urine or stools.  Denies any black tarry stools. Patient denies fever, chills, nausea, vomiting, shortness of breath, cough, abdominal pain, bowel or bladder issues. Energy level is good.  Appetite is good.  Denies any weight loss.  Patient has a history of stage II colon cancer status post resection on 10/12/2016, pT3N0 by Dr. Judeth Cornfield at [facility].  She did not require any adjuvant treatment.  She was tested for Lynch syndrome which was negative.  This was diagnosed when she developed iron deficiency anemia.  Her last colonoscopy was 3 years ago which showed internal hemorrhoids otherwise normal.  She reports family history of colon cancer in brother at age 79.    CBC showed hemoglobin of 10.4 and ferritin 12. Patient could not tolerate oral iron due to GI upset.  She reports using ibuprofen for her left knee pain which started after a fall and thinks might have overused it.  She stopped it now and has been using Tylenol.  She is also seeing orthopedics for right shoulder pain and receiving cortisone shots.  She is also on diuretics for her history of CHF which is being managed by her PCP.  Interval history Patient seen today as follow-up for iron deficiency anemia and labs. Denies any bleeding in urine or stool.  Reports ongoing pain  in the right shoulder.  Had recent MRI with [facility].  Was told that she has bone-on-bone and tear of rotator cuff.  She is pending to see orthopedics about next steps.  Recently treated for UTI with protease.  Feeling better now.  REVIEW OF SYSTEMS:   Review of Systems  Constitutional:  Negative for fever and weight loss.  Respiratory:  Negative for cough.   Cardiovascular:  Negative for chest pain.  Gastrointestinal:  Negative for abdominal pain, blood in stool, constipation, diarrhea, melena, nausea and vomiting.  Musculoskeletal:  Positive for joint pain.    As per HPI. Otherwise, a complete review of systems is negative.  PAST MEDICAL HISTORY: Past Medical History:  Diagnosis Date   Allergy Na   Anginal pain (HCC)    Arthritis NA   Cancer (HCC) 2017   Cataract 2005 & 2007   CHF (congestive heart failure) (HCC)    Coronary artery disease    Dyspnea    GERD (gastroesophageal reflux disease)    Hypertension    Hypothyroidism    Iron deficiency anemia 08/18/2022   Myocardial infarction (HCC) 12/22/20   Sleep apnea 2023    PAST SURGICAL HISTORY: Past Surgical History:  Procedure Laterality Date   ABDOMINAL HYSTERECTOMY  1991   APPENDECTOMY  2016   BLADDER SURGERY     2011   BUNIONECTOMY     CATARACT EXTRACTION Bilateral    CHOLECYSTECTOMY  2016   COLON SURGERY  2016 & 2017   CORONARY/GRAFT ACUTE MI REVASCULARIZATION N/A 12/22/2020  Procedure: Coronary/Graft Acute MI Revascularization;  Surgeon: Alwyn Pea, MD;  Location: [facility] INVASIVE CV LAB;  Service: Cardiovascular;  Laterality: N/A;   HERNIA REPAIR  2021   LEFT HEART CATH AND CORONARY ANGIOGRAPHY N/A 12/22/2020   Procedure: LEFT HEART CATH AND CORONARY ANGIOGRAPHY;  Surgeon: Alwyn Pea, MD;  Location: [facility] INVASIVE CV LAB;  Service: Cardiovascular;  Laterality: N/A;   LEFT HEART CATH AND CORONARY ANGIOGRAPHY N/A 07/21/2021   Procedure: LEFT HEART CATH AND CORONARY ANGIOGRAPHY with possible  Intervention;  Surgeon: Alwyn Pea, MD;  Location: [facility] INVASIVE CV LAB;  Service: Cardiovascular;  Laterality: N/A;    FAMILY HISTORY: Family History  Problem Relation Age of Onset   Heart disease Mother    Hypertension Mother    Obesity Mother    Vision loss Mother    Alcohol abuse Father    Cancer Brother    Diabetes Sister    Varicose Veins Daughter     HEALTH MAINTENANCE: Social History   Tobacco Use   Smoking status: Never   Smokeless tobacco: Never  Vaping Use   Vaping Use: Never used  Substance Use Topics   Alcohol use: Not Currently   Drug use: Never     Allergies  Allergen Reactions   Cyclobenzaprine Hives   Morphine And Codeine Anxiety    Hallucinations, nausea, vomiting, "doesn't help my pain at all"   Tetracyclines & Related Nausea And Vomiting    Current Outpatient Medications  Medication Sig Dispense Refill   aspirin 81 MG chewable tablet Chew 81 mg by mouth daily.     atorvastatin (LIPITOR) 10 MG tablet      buPROPion (WELLBUTRIN XL) 300 MG 24 hr tablet TAKE 1 TABLET BY MOUTH EVERY DAY 90 tablet 0   Cholecalciferol 125 MCG (5000 UT) TABS Take 5,000 Units by mouth daily.     cyanocobalamin (VITAMIN B12) 1000 MCG tablet Take 1 tablet (1,000 mcg total) by mouth daily. 90 tablet 1   diphenhydrAMINE (SIMPLY SLEEP) 25 MG tablet Take 25 mg by mouth at bedtime as needed for sleep.     ezetimibe (ZETIA) 10 MG tablet Take 1 tablet (10 mg total) by mouth daily. 90 tablet 1   famotidine (PEPCID) 20 MG tablet Take 20 mg by mouth 2 (two) times daily.     furosemide (LASIX) 20 MG tablet Take 0.5 tablets (10 mg total) by mouth daily. 30 tablet 2   gabapentin (NEURONTIN) 400 MG capsule TAKE 1 CAPSULE BY MOUTH AT BEDTIME. 90 capsule 3   hydrochlorothiazide (HYDRODIURIL) 25 MG tablet Take 25 mg by mouth daily.     KLOR-CON M20 20 MEQ tablet Take 20 mEq by mouth daily.     levothyroxine (SYNTHROID) 75 MCG tablet TAKE 1 TABLET BY MOUTH EVERY DAY 90 tablet 0    loperamide (IMODIUM A-D) 2 MG tablet Take 2 mg by mouth 4 (four) times daily as needed for diarrhea or loose stools.     Multiple Vitamins-Minerals (PRESERVISION AREDS) TABS Take 1 tablet by mouth daily.     mupirocin ointment (BACTROBAN) 2 % Apply 1 Application topically 2 (two) times daily. 22 g 0   nitroGLYCERIN (NITROSTAT) 0.4 MG SL tablet Place 0.4 mg under the tongue every 2 (two) hours as needed for chest pain.     ondansetron (ZOFRAN-ODT) 4 MG disintegrating tablet Take 1 tablet (4 mg total) by mouth every 8 (eight) hours as needed for nausea or vomiting. 16 tablet 0   triamcinolone (KENALOG) 0.025 %  cream Apply 1 application  topically daily as needed (Rash).     metoprolol succinate (TOPROL-XL) 25 MG 24 hr tablet Take 0.5 tablets (12.5 mg total) by mouth daily. (Patient taking differently: Take 25 mg by mouth daily.) 15 tablet 0   omeprazole (PRILOSEC) 20 MG capsule Take 1 capsule (20 mg total) by mouth 2 (two) times daily before a meal for 14 days. 28 capsule 0   No current facility-administered medications for this visit.    OBJECTIVE: Vitals:   05/31/23 1100  BP: (!) 154/77  Pulse: 65  Temp: 98.6 F (37 C)  SpO2: 100%     Body mass index is 29.59 kg/m.      General: Well-developed, well-nourished, no acute distress. Eyes: Pink conjunctiva, anicteric sclera. HEENT: Normocephalic, moist mucous membranes, clear oropharnyx. Lungs: Clear to auscultation bilaterally. Heart: Regular rate and rhythm. No rubs, murmurs, or gallops. Abdomen: Soft, nontender, nondistended. No organomegaly noted, normoactive bowel sounds. Musculoskeletal: No edema, cyanosis, or clubbing. Neuro: Alert, answering all questions appropriately. Cranial nerves grossly intact. Skin: No rashes or petechiae noted. Psych: Normal affect. Lymphatics: No cervical, calvicular, axillary or inguinal LAD.   LAB RESULTS:  Lab Results  Component Value Date   NA 141 08/13/2022   K 4.1 08/13/2022   CL 102  08/13/2022   CO2 28 08/13/2022   GLUCOSE 109 (H) 08/13/2022   BUN 32 (H) 08/13/2022   CREATININE 1.40 (H) 08/13/2022   CALCIUM 9.7 08/13/2022   PROT 6.4 08/13/2022   ALBUMIN 3.3 (L) 12/26/2020   AST 19 08/13/2022   ALT 19 08/13/2022   ALKPHOS 58 12/26/2020   BILITOT 0.5 08/13/2022   GFRNONAA 57 (L) 12/26/2020    Lab Results  Component Value Date   WBC 4.6 05/31/2023   NEUTROABS 3.2 05/31/2023   HGB 13.3 05/31/2023   HCT 38.9 05/31/2023   MCV 90.3 05/31/2023   PLT 237 05/31/2023    Lab Results  Component Value Date   TIBC 445 05/31/2023   TIBC 465 (H) 01/14/2023   TIBC 412 10/12/2022   FERRITIN 115 05/31/2023   FERRITIN 101 01/14/2023   FERRITIN 331 (H) 10/12/2022   IRONPCTSAT 30 05/31/2023   IRONPCTSAT 24 01/14/2023   IRONPCTSAT 31 10/12/2022   PATHOLOGY- 10/12/2016 COLON AND RECTUM: Resection, Including Transanal Disk Excision of Rectal Neoplasms  (Colon Res - All Specimens)    SPECIMEN     Procedure:    Other (specify): Extended right hemicolectomy   TUMOR     Primary Tumor Site:    Cannot be determined (explain): 2.8 cm distal to the anastomosis site     Histologic Type:    Adenocarcinoma     Histologic Grade:    Low-grade (well differentiated to moderately differentiated)     Tumor Size:    Greatest dimension (cm): 4.3 cm       Additional Dimension (cm):    3.9 cm       Additional Dimension (cm):    1.2 cm     Tumor Deposits:    Not identified       Site(s) of Direct Extent of Tumor:    None identified       Microscopic Tumor Extension:    Tumor invades through the muscularis propria into the subserosal adipose tissue or the nonperitonealized pericolic or perirectal soft tissues but does not extend to the serosal surface       Macroscopic Tumor Perforation:    Not Identified       Lymph-Vascular  Invasion:    Not identified         Intratumoral Lymphocytic Response (tumor-infiltrating lymphocytes):    Marked (3 or more per high-power field)          Peritumor Lymphocytic Response (Crohn-like response):    Mild to moderate         Tumor Subtype and Differentiation:    Mucinous tumor component           Specify Percentage of Mucinous Tumor Component:    40       Perineural Invasion:    Not identified   MARGINS     Uninvolved Margins:    All margins uninvolved by invasive carcinoma     Margins:    For Resection Specimens Only       Proximal Margin:             Proximal Margin:    Uninvolved by invasive carcinoma           Distance of Tumor from Margin:    Specify (cm): 5.8 cm       Distal Margin:             Distal Margin:    Uninvolved by invasive carcinoma           Distance of Tumor from Margin (required only for rectal tumors):    Specify (cm): 17.2 cm       Circumferential (Radial) Margin:    Uninvolved by invasive carcinoma         Distance of Tumor from Margin (required only for rectal tumors):    Specify (cm): 0.4 cm       Mesenteric Margin:    Uninvolved by invasive carcinoma         Distance of Tumor from Margin:    Specify (cm): 2.8 cm   LYMPH NODES     Regional Lymph Nodes:           Number of Lymph Nodes Examined:    Specify number: 39       Number of Lymph Nodes Involved:    Specify number: 0   STAGE (pTNM, AJCC 7th ed.)     Primary Tumor (pT):    pT3: Tumor invades through the muscularis propria into pericolorectal tissues     Regional Lymph Nodes (pN):    pN0: No regional lymph node metastasis   ADDITIONAL FINDINGS     Additional Pathologic Findings:    Other (specify): Organizing fat necrosis   Comment    Per the established protocol, block A5 was selected for Lynch syndrome screening by [facility].  IHC stains performed at [facility] show the following:   MLH1:  Absent expression. PMS2:  Absent expression. MSH2:  Normal expression. MSH6:  Normal expression.   Based on these findings and the established protocol at [facility], an appropriate tumor block is selected and sent for BRAF mutation and MLH1 promoter methylation  analyses.  When available, those results will be reported in an addendum.  Based on an established protocol at [facility], additional Lynch Syndrome testing was performed, with the following results:     BRAF mutation analysis: BRAF Mutation Detected Mutation(s) c. 1799T>A (p. V600E)   MLH1 hypermethylation analysis: MLH1 Methylation Detected MLH1 Methylation (%) 69.7   Based on these findings, the patient is NOT likely to have Lynch Syndrome, and based on current recommendations no further testing is indicated.  ASSESSMENT AND PLAN:   Karen Madden is a 77 y.o. female with pmh of CAD,  HTN, hypothyroidism, OSA and colon adenocarcinoma pT3N0 status post resection on 10/12/2016 by Dr. Judeth Cornfield at [facility] did not receive adjuvant treatment was seen for iron deficiency anemia.   #Iron deficiency anemia -Patient could not tolerate oral iron due to GI upset.  Completed IV Feraheme 550 mg x 2 doses on 09/07/2022.  Patient had good response to the iron infusions.   -Celiac panel was negative.  Follows with Dr. Tobi Bastos of GI.  H. pylori test came back positive and was treated with 2 weeks of antibiotics.    -Her hemoglobin continues to be stable at 13.2.  Iron panel is normal.  Considering stability in the hemoglobin, patient would like to hold off on GI workup at this time.  She will follow-up with her PCP in between.  I will see her back in 1 year for repeat labs and iron studies.  #Vitamin B12 deficiency -Continue with cyanocobalamin 1000 mcg daily -B12 pending  # Remote history of Stage II colon cancer  -Diagnosed with stage II colon adenocarcinoma status postresection on 10/12/2016 at [facility].  Pathology report attached above.  Did not require adjuvant treatment. -Last colonoscopy was in 2021 which showed internal hemorrhoids and was otherwise normal.  Repeat recommended in 5 years.  # Encounter for screening mammogram -Last mammogram from March 23 at [facility] was normal.  She is overdue. -Scheduled  for screening bilateral mammogram tomorrow.   Orders Placed This Encounter  Procedures   MM 3D SCREENING MAMMOGRAM BILATERAL BREAST    RTC in 1 year for MD visit, labs.  Patient expressed understanding and was in agreement with this plan. She also understands that She can call clinic at any time with any questions, concerns, or complaints.   I spent a total of 25 minutes reviewing chart data, face-to-face evaluation with the patient, counseling and coordination of care as detailed above.  Michaelyn Barter, MD   05/31/2023 1:13 PM

## 2023-06-01 ENCOUNTER — Ambulatory Visit
Admission: RE | Admit: 2023-06-01 | Discharge: 2023-06-01 | Disposition: A | Discharge status: Home / Self Care | Payer: Self-pay | Source: Ambulatory Visit | Attending: Internal Medicine | Admitting: Internal Medicine

## 2023-06-01 ENCOUNTER — Ambulatory Visit
Admission: RE | Admit: 2023-06-01 | Discharge: 2023-06-01 | Disposition: A | Discharge status: Home / Self Care | Payer: PPO | Source: Ambulatory Visit | Attending: Internal Medicine | Admitting: Internal Medicine

## 2023-06-01 ENCOUNTER — Encounter: Payer: Self-pay | Admitting: Internal Medicine

## 2023-06-01 ENCOUNTER — Other Ambulatory Visit: Payer: Self-pay | Admitting: *Deleted

## 2023-06-01 DIAGNOSIS — Z1231 Encounter for screening mammogram for malignant neoplasm of breast: Secondary | ICD-10-CM

## 2023-06-01 DIAGNOSIS — R92333 Mammographic heterogeneous density, bilateral breasts: Secondary | ICD-10-CM | POA: Diagnosis not present

## 2023-06-01 DIAGNOSIS — Z0000 Encounter for general adult medical examination without abnormal findings: Secondary | ICD-10-CM | POA: Diagnosis not present

## 2023-06-04 ENCOUNTER — Other Ambulatory Visit: Payer: Self-pay | Admitting: Internal Medicine

## 2023-06-04 DIAGNOSIS — I5032 Chronic diastolic (congestive) heart failure: Secondary | ICD-10-CM

## 2023-06-04 NOTE — Telephone Encounter (Signed)
Requested Prescriptions  Pending Prescriptions Disp Refills   furosemide (LASIX) 20 MG tablet [Pharmacy Med Name: FUROSEMIDE 20 MG TABLET] 30 tablet 2    Sig: TAKE 1/2 TABLET BY MOUTH DAILY     Cardiovascular:  Diuretics - Loop Failed - 06/04/2023  2:47 PM      Failed - K in normal range and within 180 days    Potassium  Date Value Ref Range Status  08/13/2022 4.1 3.5 - 5.3 mmol/L Final         Failed - Ca in normal range and within 180 days    Calcium  Date Value Ref Range Status  08/13/2022 9.7 8.6 - 10.4 mg/dL Final         Failed - Na in normal range and within 180 days    Sodium  Date Value Ref Range Status  08/13/2022 141 135 - 146 mmol/L Final         Failed - Cr in normal range and within 180 days    Creat  Date Value Ref Range Status  08/13/2022 1.40 (H) 0.60 - 1.00 mg/dL Final         Failed - Cl in normal range and within 180 days    Chloride  Date Value Ref Range Status  08/13/2022 102 98 - 110 mmol/L Final         Failed - Mg Level in normal range and within 180 days    Magnesium  Date Value Ref Range Status  12/24/2020 2.1 1.7 - 2.4 mg/dL Final    Comment:    Performed at [facility], [address] [place], [place] [address]         Failed - Last BP in normal range    BP Readings from Last 1 Encounters:  05/31/23 (!) 154/77         Passed - Valid encounter within last 6 months    Recent Outpatient Visits           3 weeks ago Urinary tract infection without hematuria, site unspecified   [facility] Mecum, Oswaldo Conroy, PA-C   2 months ago Callus of foot   [facility] Margarita Mail, DO   4 months ago Acute cystitis without hematuria   [facility] Margarita Mail, DO   6 months ago Herpes zoster without complication   [facility] Berniece Salines, FNP   8 months ago Primary hypertension   [facility] Margarita Mail, DO       Future Appointments             In 3 months Margarita Mail, DO [facility], [facility]

## 2023-06-07 DIAGNOSIS — M19011 Primary osteoarthritis, right shoulder: Secondary | ICD-10-CM | POA: Diagnosis not present

## 2023-06-17 ENCOUNTER — Other Ambulatory Visit: Payer: PPO

## 2023-06-17 ENCOUNTER — Ambulatory Visit: Payer: PPO | Admitting: Internal Medicine

## 2023-07-08 DIAGNOSIS — M25519 Pain in unspecified shoulder: Secondary | ICD-10-CM | POA: Diagnosis not present

## 2023-07-28 ENCOUNTER — Other Ambulatory Visit: Payer: Self-pay | Admitting: Internal Medicine

## 2023-07-29 NOTE — Telephone Encounter (Signed)
Requested Prescriptions  Pending Prescriptions Disp Refills   levothyroxine (SYNTHROID) 75 MCG tablet [Pharmacy Med Name: LEVOTHYROXINE 75 MCG TABLET] 90 tablet 0    Sig: TAKE 1 TABLET BY MOUTH EVERY DAY     Endocrinology:  Hypothyroid Agents Passed - 07/28/2023  5:32 PM      Passed - TSH in normal range and within 360 days    TSH  Date Value Ref Range Status  08/13/2022 2.13 0.40 - 4.50 mIU/L Final         Passed - Valid encounter within last 12 months    Recent Outpatient Visits           2 months ago Urinary tract infection without hematuria, site unspecified   [facility] Mecum, Oswaldo Conroy, PA-C   4 months ago Callus of foot   [facility] Margarita Mail, DO   6 months ago Acute cystitis without hematuria   [facility] Margarita Mail, DO   8 months ago Herpes zoster without complication   [facility] Berniece Salines, FNP   10 months ago Primary hypertension   [facility] Margarita Mail, DO       Future Appointments             In 1 month Margarita Mail, DO [facility], [facility]

## 2023-08-14 ENCOUNTER — Encounter: Payer: Self-pay | Admitting: Internal Medicine

## 2023-08-17 ENCOUNTER — Encounter: Payer: Self-pay | Admitting: Family Medicine

## 2023-08-17 ENCOUNTER — Ambulatory Visit (INDEPENDENT_AMBULATORY_CARE_PROVIDER_SITE_OTHER): Payer: PPO | Admitting: Family Medicine

## 2023-08-17 VITALS — BP 148/92 | HR 72 | Temp 97.8°F | Resp 16 | Ht 64.0 in | Wt 172.4 lb

## 2023-08-17 DIAGNOSIS — Z87442 Personal history of urinary calculi: Secondary | ICD-10-CM

## 2023-08-17 DIAGNOSIS — R300 Dysuria: Secondary | ICD-10-CM | POA: Diagnosis not present

## 2023-08-17 DIAGNOSIS — Z9889 Other specified postprocedural states: Secondary | ICD-10-CM | POA: Diagnosis not present

## 2023-08-17 DIAGNOSIS — N390 Urinary tract infection, site not specified: Secondary | ICD-10-CM | POA: Diagnosis not present

## 2023-08-17 LAB — POCT URINALYSIS DIPSTICK
Bilirubin, UA: NEGATIVE
Glucose, UA: NEGATIVE
Ketones, UA: NEGATIVE
Nitrite, UA: NEGATIVE
Protein, UA: NEGATIVE
Spec Grav, UA: 1.01 (ref 1.010–1.025)
Urobilinogen, UA: 0.2 E.U./dL
pH, UA: 5 (ref 5.0–8.0)

## 2023-08-17 MED ORDER — CEPHALEXIN 500 MG PO CAPS
500.0000 mg | ORAL_CAPSULE | Freq: Two times a day (BID) | ORAL | 0 refills | Status: AC
Start: 2023-08-17 — End: 2023-08-22

## 2023-08-17 NOTE — Progress Notes (Signed)
Patient ID: MERSADIES LEAK, female    DOB: May 24, 1946, 77 y.o.   MRN: [id number]  PCP: Margarita Mail, DO  Chief Complaint  Patient presents with   Dysuria   Urinary Frequency    Sx onset for 1 1/2 week    Subjective:   Karen Madden is a 77 y.o. female, presents to clinic with CC of the following:  HPI  Here for suspected UTI She had 2 recently positive urine cultures with proteus - UTI in Feb and May this year Sx are similar to past two UTIs but actually more pain  Hx of three bladder surgeries and kidney stones   She reports a lot of pain Sunday (2 days ago), it was difficult to pee then she peed a lot She had suprapubic ttp worse on the L than the right No bowel or vaginal sx She denies flank pain fever nausea vomiting  She is not established with a urologist in the area    Patient Active Problem List   Diagnosis Date Noted   Screening mammogram, encounter for 05/31/2023   B12 deficiency 10/13/2022   Cataracts, bilateral 09/01/2022   Iron deficiency anemia 08/18/2022   Vertigo 02/16/2022   CAD (coronary artery disease) 12/23/2020   HTN (hypertension) 12/23/2020   COVID-19 virus infection 12/23/2020   Pneumonia due to COVID-19 virus 12/23/2020   STEMI involving left anterior descending coronary artery (HCC) 12/22/2020   STEMI (ST elevation myocardial infarction) (HCC) 12/22/2020   Depression 08/02/2020   Hernia of anterior abdominal wall 10/06/2019   Disorder of tendon of right biceps 10/05/2019   Advance care planning 10/03/2019   Right lateral epicondylitis 06/09/2019   Bilateral carotid artery disease (HCC) 08/23/2018   Hypercholesteremia 04/10/2018   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/2018   Recurrent major depressive disorder, in partial remission (HCC) 10/22/2017   Abdominal aortic atherosclerosis (HCC) 09/04/2017   Vitamin D deficiency 05/28/2017   Dermatofibroma 05/10/2017   History of colon cancer 10/13/2016   Chronic insomnia 06/02/2016   History  of basal cell cancer 06/02/2016   Chronic low back pain without sciatica 06/02/2016   Hearing loss 08/08/2015   Overweight (BMI 25.0-29.9) 03/15/2014   Osteoarthritis 02/20/2014   Hypothyroidism 02/20/2014   Muscle weakness 09/13/2013   Leg pain 09/13/2013      Current Outpatient Medications:    aspirin 81 MG chewable tablet, Chew 81 mg by mouth daily., Disp: , Rfl:    atorvastatin (LIPITOR) 10 MG tablet, , Disp: , Rfl:    buPROPion (WELLBUTRIN XL) 300 MG 24 hr tablet, TAKE 1 TABLET BY MOUTH EVERY DAY, Disp: 90 tablet, Rfl: 0   cephALEXin (KEFLEX) 500 MG capsule, Take 1 capsule (500 mg total) by mouth 2 (two) times daily for 5 days., Disp: 10 capsule, Rfl: 0   Cholecalciferol 125 MCG (5000 UT) TABS, Take 5,000 Units by mouth daily., Disp: , Rfl:    cyanocobalamin (VITAMIN B12) 1000 MCG tablet, Take 1 tablet (1,000 mcg total) by mouth daily., Disp: 90 tablet, Rfl: 1   ezetimibe (ZETIA) 10 MG tablet, Take 1 tablet (10 mg total) by mouth daily., Disp: 90 tablet, Rfl: 1   famotidine (PEPCID) 20 MG tablet, Take 20 mg by mouth 2 (two) times daily., Disp: , Rfl:    furosemide (LASIX) 20 MG tablet, TAKE 1/2 TABLET BY MOUTH DAILY, Disp: 30 tablet, Rfl: 2   gabapentin (NEURONTIN) 400 MG capsule, TAKE 1 CAPSULE BY MOUTH AT BEDTIME., Disp: 90 capsule, Rfl: 3  hydrochlorothiazide (HYDRODIURIL) 25 MG tablet, Take 25 mg by mouth daily., Disp: , Rfl:    KLOR-CON M20 20 MEQ tablet, Take 20 mEq by mouth daily., Disp: , Rfl:    levothyroxine (SYNTHROID) 75 MCG tablet, TAKE 1 TABLET BY MOUTH EVERY DAY, Disp: 90 tablet, Rfl: 0   loperamide (IMODIUM A-D) 2 MG tablet, Take 2 mg by mouth 4 (four) times daily as needed for diarrhea or loose stools., Disp: , Rfl:    Multiple Vitamins-Minerals (PRESERVISION AREDS) TABS, Take 1 tablet by mouth daily., Disp: , Rfl:    nitroGLYCERIN (NITROSTAT) 0.4 MG SL tablet, Place 0.4 mg under the tongue every 2 (two) hours as needed for chest pain., Disp: , Rfl:     diphenhydrAMINE (SIMPLY SLEEP) 25 MG tablet, Take 25 mg by mouth at bedtime as needed for sleep., Disp: , Rfl:    metoprolol succinate (TOPROL-XL) 25 MG 24 hr tablet, Take 0.5 tablets (12.5 mg total) by mouth daily. (Patient taking differently: Take 25 mg by mouth daily.), Disp: 15 tablet, Rfl: 0   mupirocin ointment (BACTROBAN) 2 %, Apply 1 Application topically 2 (two) times daily., Disp: 22 g, Rfl: 0   omeprazole (PRILOSEC) 20 MG capsule, Take 1 capsule (20 mg total) by mouth 2 (two) times daily before a meal for 14 days., Disp: 28 capsule, Rfl: 0   ondansetron (ZOFRAN-ODT) 4 MG disintegrating tablet, Take 1 tablet (4 mg total) by mouth every 8 (eight) hours as needed for nausea or vomiting., Disp: 16 tablet, Rfl: 0   triamcinolone (KENALOG) 0.025 % cream, Apply 1 application  topically daily as needed (Rash)., Disp: , Rfl:    Allergies  Allergen Reactions   Cyclobenzaprine Hives   Morphine And Codeine Anxiety    Hallucinations, nausea, vomiting, "doesn't help my pain at all"   Tetracyclines & Related Nausea And Vomiting     Social History   Tobacco Use   Smoking status: Never   Smokeless tobacco: Never  Vaping Use   Vaping status: Never Used  Substance Use Topics   Alcohol use: Not Currently   Drug use: Never      Chart Review Today: I personally reviewed active problem list, medication list, allergies, family history, social history, health maintenance, notes from last encounter, lab results, imaging with the patient/caregiver today.   Review of Systems  Constitutional: Negative.   HENT: Negative.    Eyes: Negative.   Respiratory: Negative.    Cardiovascular: Negative.   Gastrointestinal: Negative.   Endocrine: Negative.   Genitourinary: Negative.   Musculoskeletal: Negative.   Skin: Negative.   Allergic/Immunologic: Negative.   Neurological: Negative.   Hematological: Negative.   Psychiatric/Behavioral: Negative.    All other systems reviewed and are  negative.      Objective:   Vitals:   08/17/23 1153  BP: (!) 148/92  Pulse: 72  Resp: 16  Temp: 97.8 F (36.6 C)  TempSrc: Oral  SpO2: 98%  Weight: 172 lb 6.4 oz (78.2 kg)  Height: 5\' 4"  (1.626 m)    Body mass index is 29.59 kg/m.  Physical Exam Vitals and nursing note reviewed.  Constitutional:      General: She is not in acute distress.    Appearance: Normal appearance. She is well-developed. She is obese. She is not ill-appearing, toxic-appearing or diaphoretic.  HENT:     Head: Normocephalic and atraumatic.     Nose: Nose normal.  Eyes:     General:        Right eye:  No discharge.        Left eye: No discharge.     Conjunctiva/sclera: Conjunctivae normal.  Neck:     Trachea: No tracheal deviation.  Cardiovascular:     Rate and Rhythm: Normal rate and regular rhythm.  Pulmonary:     Effort: Pulmonary effort is normal. No respiratory distress.     Breath sounds: No stridor.  Abdominal:     General: Bowel sounds are normal.     Palpations: Abdomen is soft.     Tenderness: There is no abdominal tenderness. There is no right CVA tenderness, left CVA tenderness, guarding or rebound.  Musculoskeletal:        General: Normal range of motion.  Skin:    General: Skin is warm and dry.     Findings: No rash.  Neurological:     Mental Status: She is alert.     Motor: No abnormal muscle tone.     Coordination: Coordination normal.  Psychiatric:        Behavior: Behavior normal.      Results for orders placed or performed in visit on 08/17/23  POCT urinalysis dipstick  Result Value Ref Range   Color, UA Yellow    Clarity, UA Cloudy    Glucose, UA Negative Negative   Bilirubin, UA Negative    Ketones, UA Negative    Spec Grav, UA 1.010 1.010 - 1.025   Blood, UA Moderate    pH, UA 5.0 5.0 - 8.0   Protein, UA Negative Negative   Urobilinogen, UA 0.2 0.2 or 1.0 E.U./dL   Nitrite, UA Negative    Leukocytes, UA Large (3+) (A) Negative   Appearance Yellow     Odor Foul        Assessment & Plan:     ICD-10-CM   1. Dysuria  R30.0 POCT urinalysis dipstick    Urine Culture    cephALEXin (KEFLEX) 500 MG capsule    Ambulatory referral to Urology   onset 2 d ago, + blood and leuks, will cover for UTI, but kidney stone also in ddx    2. Recurrent UTI  N39.0 POCT urinalysis dipstick    Urine Culture    Ambulatory referral to Urology   culture ordered, meds rx'd considering last C&S x 2, pt declines any vaginal estrogen cream tx trial, doesn't want urology right now    3. History of kidney stones  Z87.442 Ambulatory referral to Urology   encouraged pt to make appt with urology with UTI x 3 this year and pertinent past med hx of kidney stones and she reports 3 bladder surgeries    4. History of bladder surgery  Z98.890 Ambulatory referral to Urology      Results for orders placed or performed in visit on 08/17/23  POCT urinalysis dipstick  Result Value Ref Range   Color, UA Yellow    Clarity, UA Cloudy    Glucose, UA Negative Negative   Bilirubin, UA Negative    Ketones, UA Negative    Spec Grav, UA 1.010 1.010 - 1.025   Blood, UA Moderate    pH, UA 5.0 5.0 - 8.0   Protein, UA Negative Negative   Urobilinogen, UA 0.2 0.2 or 1.0 E.U./dL   Nitrite, UA Negative    Leukocytes, UA Large (3+) (A) Negative   Appearance Yellow    Odor Foul    Cover empirically and per recent culture and sensitivities with Keflex We will follow culture Patient encouraged to follow-up if not  improving in the next week Encouraged her to get up appointment with urology in the next couple months whenever she can she is scheduled for rotator cuff surgery and will have the surgery in 6 weeks of PT which she says will be difficult to go to another specialist encouraged her to schedule it in 2 months... She then wanted to wait until maybe after the holidays and I encouraged her to just get the appointment done with 3 UTIs in a row and if she wants to wait until later she  will need another appointment to follow-up to do the referral process and orders all over again    Karen Berry, PA-C 08/17/23 12:12 PM

## 2023-08-18 ENCOUNTER — Encounter: Payer: Self-pay | Admitting: Family Medicine

## 2023-08-18 DIAGNOSIS — N390 Urinary tract infection, site not specified: Secondary | ICD-10-CM

## 2023-08-19 MED ORDER — SULFAMETHOXAZOLE-TRIMETHOPRIM 800-160 MG PO TABS
1.0000 | ORAL_TABLET | Freq: Two times a day (BID) | ORAL | 0 refills | Status: AC
Start: 2023-08-19 — End: 2023-08-24

## 2023-08-20 LAB — URINE CULTURE
MICRO NUMBER:: 15388542
SPECIMEN QUALITY:: ADEQUATE

## 2023-08-25 ENCOUNTER — Other Ambulatory Visit: Payer: Self-pay | Admitting: Internal Medicine

## 2023-08-25 DIAGNOSIS — F330 Major depressive disorder, recurrent, mild: Secondary | ICD-10-CM

## 2023-08-26 NOTE — Telephone Encounter (Signed)
Requested medications are due for refill today.  yes  Requested medications are on the active medications list.  yes  Last refill. 05/23/2023 #90 0 rf  Future visit scheduled.   yes  Notes to clinic.  Expired labs.    Requested Prescriptions  Pending Prescriptions Disp Refills   buPROPion (WELLBUTRIN XL) 300 MG 24 hr tablet [Pharmacy Med Name: BUPROPION HCL XL 300 MG TABLET] 90 tablet 0    Sig: TAKE 1 TABLET BY MOUTH EVERY DAY     Psychiatry: Antidepressants - bupropion Failed - 08/25/2023 11:49 AM      Failed - Cr in normal range and within 360 days    Creat  Date Value Ref Range Status  08/13/2022 1.40 (H) 0.60 - 1.00 mg/dL Final         Failed - AST in normal range and within 360 days    AST  Date Value Ref Range Status  08/13/2022 19 10 - 35 U/L Final         Failed - ALT in normal range and within 360 days    ALT  Date Value Ref Range Status  08/13/2022 19 6 - 29 U/L Final         Failed - Last BP in normal range    BP Readings from Last 1 Encounters:  08/17/23 (!) 148/92         Passed - Completed PHQ-2 or PHQ-9 in the last 360 days      Passed - Valid encounter within last 6 months    Recent Outpatient Visits           1 week ago Dysuria   [facility] Danelle Berry, PA-C   3 months ago Urinary tract infection without hematuria, site unspecified   [facility] Mecum, Oswaldo Conroy, PA-C   5 months ago Callus of foot   [facility] Margarita Mail, DO   7 months ago Acute cystitis without hematuria   [facility] Margarita Mail, DO   9 months ago Herpes zoster without complication   [facility] Berniece Salines, FNP       Future Appointments             In 3 weeks Margarita Mail, DO [facility]   In 3 weeks Vanna Scotland, MD [facility] [place]

## 2023-08-30 ENCOUNTER — Encounter: Payer: Self-pay | Admitting: Internal Medicine

## 2023-08-31 NOTE — Progress Notes (Deleted)
Established Patient Office Visit  Subjective    Patient ID: Karen Madden, female    DOB: 04/25/1946  Age: 77 y.o. MRN: [id number]  CC:  No chief complaint on file.   HPI Karen Madden presents for pre-op exam.  Type of Surgery:shoulder replacement Date of Surgery: 09/14/23 Location of Surgery: Doctor Performing Surgery: Dr. Ross Marcus Hx of surgical complications: Hx of anesthesia complications Hx of MI/stenting, COPD, DM Hx of smoking? ETOH? Drug use? Revised Cardiac Risk Index for major cardiac event (RCRI): Pulmonary Risk Score (ARISCAT): MET Score EKG: CXR: Labwork: This patient is Low/Mod/High pulm risk and low/mod/high cardiac risk with &gt;4/&lt;4 MET score. Patient may/may not proceed with low/mod/high risk procedure as previously planned.   Hx of STEMI/HTN/OSA/CHFpEF: -Medications: Lasix 20 mg, HCTZ 25 mg, KCl 20 meQ, Metoprolol 25 mg, nitroglycerin PRN -Patient is compliant with above medications and reports no side effects. -Checking BP at home (average): 120-130/80 -Denies any SOB, CP, vision changes, LE edema or symptoms of hypotension -Diet: Working on diet, has gained 10 pounds -Most recent echo 6/22 EF >55% -Hx of STEMI 12/22/20 - 1 stent in LAD, following with Cardiology currently, last seen 04/14/22. Planning on seeing later this week  -Most recent cath 08/09/21 showing widely patent stent in LAD, EF 60% -Working on CPAP right now for OSA  CAD/HLD: -Medications: aspirin 81, Plavix 75 mg, Lipitor 80 mg (held for 90 days per Cardiology recommendation to see if fatigue resolves, uncertain if she did this), Zetia 10 mg  -Patient is compliant with above medications and reports no side effects.  -Last lipid panel: Lipid Panel     Component Value Date/Time   CHOL 191 12/22/2020 1521   TRIG 144 12/22/2020 1521   HDL 70 12/22/2020 1521   CHOLHDL 2.7 12/22/2020 1521   VLDL 29 12/22/2020 1521   LDLCALC 92 12/22/2020 1521    Hypothyroidism: -Medications: Levothyroxine 75 mcg  -Patient is compliant with the above medication (s) at the above dose and reports no medication side effects.  -Denies weight changes, cold./heat intolerance, skin changes, anxiety/palpitations  -Last TSH: 2.13 8/23  GERD: -Currently using Pepcid 20 mg over the counter PRN  Hx of Colon Cancer:  -First diagnosed in 2016 - had a large polyp on cecum. Then hgb was low, passed out. Had a second tumor removed in 2017. Had a partial colectomy.  -Last CBC showing hgb 10.3 in 2/23 -Following with GI - planning on EGD and repeat colonoscopy and if negative a capsule study of the small bowel. Last by GI on 09/01/22. H. Pylori positive, about to start treatment.   MDD: -Mood status: stable -Current treatment: Wellbutrin 300 mg  -Satisfied with current treatment?: yes -Symptom severity: mild  -Duration of current treatment : years -Side effects: no Medication compliance: excellent compliance     08/17/2023   11:53 AM 05/11/2023    2:12 PM 04/29/2023   11:38 AM 03/09/2023    3:13 PM 01/21/2023   11:17 AM  Depression screen PHQ 2/9  Decreased Interest 0 0 0 0 0  Down, Depressed, Hopeless 0 0 0 0 0  PHQ - 2 Score 0 0 0 0 0  Altered sleeping 0 0 0 0 0  Tired, decreased energy 0 0 0 0 0  Change in appetite 0 0 0 0 0  Feeling bad or failure about yourself  0 0 0 0 0  Trouble concentrating 0 0 0 0 0  Moving slowly or fidgety/restless 0 0 0  0 0  Suicidal thoughts 0 0 0 0 0  PHQ-9 Score 0 0 0 0 0  Difficult doing work/chores Not difficult at all Not difficult at all Not difficult at all Not difficult at all Not difficult at all   Health Maintenance: -Blood work UTD -Mammogram 3/23 Birads-1  Outpatient Encounter Medications as of 09/02/2023  Medication Sig   aspirin 81 MG chewable tablet Chew 81 mg by mouth daily.   atorvastatin (LIPITOR) 10 MG tablet    buPROPion (WELLBUTRIN XL) 300 MG 24 hr tablet TAKE 1 TABLET BY MOUTH EVERY DAY    Cholecalciferol 125 MCG (5000 UT) TABS Take 5,000 Units by mouth daily.   cyanocobalamin (VITAMIN B12) 1000 MCG tablet Take 1 tablet (1,000 mcg total) by mouth daily.   diphenhydrAMINE (SIMPLY SLEEP) 25 MG tablet Take 25 mg by mouth at bedtime as needed for sleep.   ezetimibe (ZETIA) 10 MG tablet Take 1 tablet (10 mg total) by mouth daily.   famotidine (PEPCID) 20 MG tablet Take 20 mg by mouth 2 (two) times daily.   furosemide (LASIX) 20 MG tablet TAKE 1/2 TABLET BY MOUTH DAILY   gabapentin (NEURONTIN) 400 MG capsule TAKE 1 CAPSULE BY MOUTH AT BEDTIME.   hydrochlorothiazide (HYDRODIURIL) 25 MG tablet Take 25 mg by mouth daily.   KLOR-CON M20 20 MEQ tablet Take 20 mEq by mouth daily.   levothyroxine (SYNTHROID) 75 MCG tablet TAKE 1 TABLET BY MOUTH EVERY DAY   loperamide (IMODIUM A-D) 2 MG tablet Take 2 mg by mouth 4 (four) times daily as needed for diarrhea or loose stools.   metoprolol succinate (TOPROL-XL) 25 MG 24 hr tablet Take 0.5 tablets (12.5 mg total) by mouth daily. (Patient taking differently: Take 25 mg by mouth daily.)   Multiple Vitamins-Minerals (PRESERVISION AREDS) TABS Take 1 tablet by mouth daily.   mupirocin ointment (BACTROBAN) 2 % Apply 1 Application topically 2 (two) times daily.   nitroGLYCERIN (NITROSTAT) 0.4 MG SL tablet Place 0.4 mg under the tongue every 2 (two) hours as needed for chest pain.   omeprazole (PRILOSEC) 20 MG capsule Take 1 capsule (20 mg total) by mouth 2 (two) times daily before a meal for 14 days.   ondansetron (ZOFRAN-ODT) 4 MG disintegrating tablet Take 1 tablet (4 mg total) by mouth every 8 (eight) hours as needed for nausea or vomiting.   triamcinolone (KENALOG) 0.025 % cream Apply 1 application  topically daily as needed (Rash).   No facility-administered encounter medications on file as of 09/02/2023.    Past Medical History:  Diagnosis Date   Allergy Na   Anginal pain (HCC)    Arthritis NA   Cancer (HCC) 2017   Cataract 2005 & 2007   CHF  (congestive heart failure) (HCC)    Coronary artery disease    Dyspnea    GERD (gastroesophageal reflux disease)    Hypertension    Hypothyroidism    Iron deficiency anemia 08/18/2022   Myocardial infarction (HCC) 12/22/20   Sleep apnea 2023    Past Surgical History:  Procedure Laterality Date   ABDOMINAL HYSTERECTOMY  1991   APPENDECTOMY  2016   BLADDER SURGERY     2011   BUNIONECTOMY     CATARACT EXTRACTION Bilateral    CHOLECYSTECTOMY  2016   COLON SURGERY  2016 & 2017   CORONARY/GRAFT ACUTE MI REVASCULARIZATION N/A 12/22/2020   Procedure: Coronary/Graft Acute MI Revascularization;  Surgeon: Alwyn Pea, MD;  Location: [facility] INVASIVE CV LAB;  Service: Cardiovascular;  Laterality: N/A;   HERNIA REPAIR  2021   LEFT HEART CATH AND CORONARY ANGIOGRAPHY N/A 12/22/2020   Procedure: LEFT HEART CATH AND CORONARY ANGIOGRAPHY;  Surgeon: Alwyn Pea, MD;  Location: [facility] INVASIVE CV LAB;  Service: Cardiovascular;  Laterality: N/A;   LEFT HEART CATH AND CORONARY ANGIOGRAPHY N/A 07/21/2021   Procedure: LEFT HEART CATH AND CORONARY ANGIOGRAPHY with possible Intervention;  Surgeon: Alwyn Pea, MD;  Location: [facility] INVASIVE CV LAB;  Service: Cardiovascular;  Laterality: N/A;    Family History  Problem Relation Age of Onset   Heart disease Mother    Hypertension Mother    Obesity Mother    Vision loss Mother    Alcohol abuse Father    Cancer Brother    Diabetes Sister    Varicose Veins Daughter     Social History   Socioeconomic History   Marital status: Married    Spouse name: Not on file   Number of children: Not on file   Years of education: Not on file   Highest education level: Bachelor's degree (e.g., BA, AB, BS)  Occupational History   Not on file  Tobacco Use   Smoking status: Never   Smokeless tobacco: Never  Vaping Use   Vaping status: Never Used  Substance and Sexual Activity   Alcohol use: Not Currently   Drug use: Never   Sexual activity: Yes   Other Topics Concern   Not on file  Social History Narrative   Not on file   Social Determinants of Health   Financial Resource Strain: Low Risk  (08/31/2023)   Overall Financial Resource Strain (CARDIA)    Difficulty of Paying Living Expenses: Not hard at all  Food Insecurity: No Food Insecurity (08/31/2023)   Hunger Vital Sign    Worried About Running Out of Food in the Last Year: Never true    Ran Out of Food in the Last Year: Never true  Transportation Needs: No Transportation Needs (08/31/2023)   PRAPARE - Administrator, Civil Service (Medical): No    Lack of Transportation (Non-Medical): No  Physical Activity: Inactive (08/31/2023)   Exercise Vital Sign    Days of Exercise per Week: 0 days    Minutes of Exercise per Session: 30 min  Stress: Stress Concern Present (08/31/2023)   [employer] of Occupational Health - Occupational Stress Questionnaire    Feeling of Stress : To some extent  Social Connections: Socially Integrated (08/31/2023)   Social Connection and Isolation Panel [NHANES]    Frequency of Communication with Friends and Family: More than three times a week    Frequency of Social Gatherings with Friends and Family: More than three times a week    Attends Religious Services: More than 4 times per year    Active Member of [employer] or Organizations: Yes    Attends Engineer, structural: More than 4 times per year    Marital Status: Married  Catering manager Violence: Not At Risk (04/29/2023)   Humiliation, Afraid, Rape, and Kick questionnaire    Fear of Current or Ex-Partner: No    Emotionally Abused: No    Physically Abused: No    Sexually Abused: No    Review of Systems  Constitutional:  Negative for chills and fever.  Eyes:  Negative for blurred vision.  Respiratory:  Negative for cough, shortness of breath and wheezing.   Cardiovascular:  Negative for chest pain and palpitations.  Gastrointestinal:  Positive for abdominal pain. Negative  for blood in stool, heartburn, melena, nausea and vomiting.  Neurological:  Negative for dizziness.      Objective    There were no vitals taken for this visit.  Physical Exam Constitutional:      Appearance: Normal appearance.  HENT:     Head: Normocephalic and atraumatic.  Eyes:     Conjunctiva/sclera: Conjunctivae normal.  Cardiovascular:     Rate and Rhythm: Normal rate and regular rhythm.  Pulmonary:     Effort: Pulmonary effort is normal.     Breath sounds: Normal breath sounds.  Musculoskeletal:     Right lower leg: No edema.     Left lower leg: No edema.  Skin:    General: Skin is warm and dry.  Neurological:     General: No focal deficit present.     Mental Status: She is alert. Mental status is at baseline.  Psychiatric:        Mood and Affect: Mood normal.        Behavior: Behavior normal.       Assessment & Plan:   1. Primary hypertension/Chronic heart failure with preserved ejection fraction [facility]): Blood pressure stable, euvolemic on exam. Continue Lasix 20 mg, HCTZ 25 mg, KCl 20 meQ, Metoprolol 25 mg. Following with Cardiology later this week. Follow up here in about 4 months.   2. Hypothyroidism, unspecified type: TSH stable on labs, continue Levothyroxine 75 mcg.   3. Anemia, unspecified type: Vitamin B12 low, now on supplements. Following with GI, being treated for H pylori.   4. Mild episode of recurrent major depressive disorder (HCC): Stable, continue Wellbutrin 300 mg.    No follow-ups on file.   Margarita Mail, DO

## 2023-09-01 DIAGNOSIS — M19011 Primary osteoarthritis, right shoulder: Secondary | ICD-10-CM | POA: Diagnosis not present

## 2023-09-01 DIAGNOSIS — Z01812 Encounter for preprocedural laboratory examination: Secondary | ICD-10-CM | POA: Diagnosis not present

## 2023-09-01 DIAGNOSIS — Z01818 Encounter for other preprocedural examination: Secondary | ICD-10-CM | POA: Diagnosis not present

## 2023-09-02 ENCOUNTER — Ambulatory Visit: Payer: PPO | Admitting: Internal Medicine

## 2023-09-05 NOTE — Progress Notes (Unsigned)
Established Patient Office Visit  Subjective    Patient ID: Karen Madden, female    DOB: [birth year]  Age: 77 y.o. MRN: [id number]  CC:  No chief complaint on file.   HPI Karen Madden presents for pre-op exam.  Type of Surgery:shoulder replacement Date of Surgery: 09/14/23 Location of Surgery: [facility] Performing Surgery: Dr. Ross Marcus Hx of surgical complications: Hx of anesthesia complications Hx of MI/stenting, COPD, DM Hx of smoking? ETOH? Drug use? Revised Cardiac Risk Index for major cardiac event (RCRI): Pulmonary Risk Score (ARISCAT): MET Score EKG: CXR: Labwork: This patient is Low/Mod/High pulm risk and low/mod/high cardiac risk with &gt;4/&lt;4 MET score. Patient may/may not proceed with low/mod/high risk procedure as previously planned.   Hx of STEMI/HTN/OSA/CHFpEF: -Medications: Lasix 20 mg, HCTZ 25 mg, KCl 20 meQ, Metoprolol 25 mg, nitroglycerin PRN -Patient is compliant with above medications and reports no side effects. -Checking BP at home (average): 120-130/80 -Denies any SOB, CP, vision changes, LE edema or symptoms of hypotension -Diet: Working on diet, has gained 10 pounds -Most recent echo 6/22 EF >55% -Hx of STEMI 12/22/20 - 1 stent in LAD, following with Cardiology currently, last seen 04/14/22. Planning on seeing later this week  -Most recent cath 08/09/21 showing widely patent stent in LAD, EF 60% -Working on CPAP right now for OSA  CAD/HLD: -Medications: aspirin 81, Plavix 75 mg, Lipitor 80 mg (held for 90 days per Cardiology recommendation to see if fatigue resolves, uncertain if Karen Madden did this), Zetia 10 mg  -Patient is compliant with above medications and reports no side effects.  -Last lipid panel: Lipid Panel     Component Value Date/Time   CHOL 191 12/22/2020 1521   TRIG 144 12/22/2020 1521   HDL 70 12/22/2020 1521   CHOLHDL 2.7 12/22/2020 1521   VLDL 29 12/22/2020 1521   LDLCALC 92 12/22/2020 1521    Hypothyroidism: -Medications: Levothyroxine 75 mcg  -Patient is compliant with the above medication (s) at the above dose and reports no medication side effects.  -Denies weight changes, cold./heat intolerance, skin changes, anxiety/palpitations  -Last TSH: 2.13 8/23  GERD: -Currently using Pepcid 20 mg over the counter PRN  Hx of Colon Cancer:  -First diagnosed in 2016 - had a large polyp on cecum. Then hgb was low, passed out. Had a second tumor removed in 2017. Had a partial colectomy.  -Last CBC showing hgb 10.3 in 2/23 -Following with GI - planning on EGD and repeat colonoscopy and if negative a capsule study of the small bowel. Last by GI on 09/01/22. H. Pylori positive, about to start treatment.   MDD: -Mood status: stable -Current treatment: Wellbutrin 300 mg  -Satisfied with current treatment?: yes -Symptom severity: mild  -Duration of current treatment : years -Side effects: no Medication compliance: excellent compliance     08/17/2023   11:53 AM 05/11/2023    2:12 PM 04/29/2023   11:38 AM 03/09/2023    3:13 PM 01/21/2023   11:17 AM  Depression screen PHQ 2/9  Decreased Interest 0 0 0 0 0  Down, Depressed, Hopeless 0 0 0 0 0  PHQ - 2 Score 0 0 0 0 0  Altered sleeping 0 0 0 0 0  Tired, decreased energy 0 0 0 0 0  Change in appetite 0 0 0 0 0  Feeling bad or failure about yourself  0 0 0 0 0  Trouble concentrating 0 0 0 0 0  Moving slowly or fidgety/restless 0 0 0  0 0  Suicidal thoughts 0 0 0 0 0  PHQ-9 Score 0 0 0 0 0  Difficult doing work/chores Not difficult at all Not difficult at all Not difficult at all Not difficult at all Not difficult at all   Health Maintenance: -Blood work UTD -Mammogram 3/23 Birads-1  Outpatient Encounter Medications as of 09/06/2023  Medication Sig   aspirin 81 MG chewable tablet Chew 81 mg by mouth daily.   atorvastatin (LIPITOR) 10 MG tablet    buPROPion (WELLBUTRIN XL) 300 MG 24 hr tablet TAKE 1 TABLET BY MOUTH EVERY DAY    Cholecalciferol 125 MCG (5000 UT) TABS Take 5,000 Units by mouth daily.   cyanocobalamin (VITAMIN B12) 1000 MCG tablet Take 1 tablet (1,000 mcg total) by mouth daily.   diphenhydrAMINE (SIMPLY SLEEP) 25 MG tablet Take 25 mg by mouth at bedtime as needed for sleep.   ezetimibe (ZETIA) 10 MG tablet Take 1 tablet (10 mg total) by mouth daily.   famotidine (PEPCID) 20 MG tablet Take 20 mg by mouth 2 (two) times daily.   furosemide (LASIX) 20 MG tablet TAKE 1/2 TABLET BY MOUTH DAILY   gabapentin (NEURONTIN) 400 MG capsule TAKE 1 CAPSULE BY MOUTH AT BEDTIME.   hydrochlorothiazide (HYDRODIURIL) 25 MG tablet Take 25 mg by mouth daily.   KLOR-CON M20 20 MEQ tablet Take 20 mEq by mouth daily.   levothyroxine (SYNTHROID) 75 MCG tablet TAKE 1 TABLET BY MOUTH EVERY DAY   loperamide (IMODIUM A-D) 2 MG tablet Take 2 mg by mouth 4 (four) times daily as needed for diarrhea or loose stools.   metoprolol succinate (TOPROL-XL) 25 MG 24 hr tablet Take 0.5 tablets (12.5 mg total) by mouth daily. (Patient taking differently: Take 25 mg by mouth daily.)   Multiple Vitamins-Minerals (PRESERVISION AREDS) TABS Take 1 tablet by mouth daily.   mupirocin ointment (BACTROBAN) 2 % Apply 1 Application topically 2 (two) times daily.   nitroGLYCERIN (NITROSTAT) 0.4 MG SL tablet Place 0.4 mg under the tongue every 2 (two) hours as needed for chest pain.   omeprazole (PRILOSEC) 20 MG capsule Take 1 capsule (20 mg total) by mouth 2 (two) times daily before a meal for 14 days.   ondansetron (ZOFRAN-ODT) 4 MG disintegrating tablet Take 1 tablet (4 mg total) by mouth every 8 (eight) hours as needed for nausea or vomiting.   triamcinolone (KENALOG) 0.025 % cream Apply 1 application  topically daily as needed (Rash).   No facility-administered encounter medications on file as of 09/06/2023.    Past Medical History:  Diagnosis Date   Allergy Na   Anginal pain (HCC)    Arthritis NA   Cancer (HCC) 2017   Cataract 2005 & 2007   CHF  (congestive heart failure) (HCC)    Coronary artery disease    Dyspnea    GERD (gastroesophageal reflux disease)    Hypertension    Hypothyroidism    Iron deficiency anemia 08/18/2022   Myocardial infarction (HCC) 12/22/20   Sleep apnea 2023    Past Surgical History:  Procedure Laterality Date   ABDOMINAL HYSTERECTOMY  1991   APPENDECTOMY  2016   BLADDER SURGERY     2011   BUNIONECTOMY     CATARACT EXTRACTION Bilateral    CHOLECYSTECTOMY  2016   COLON SURGERY  2016 & 2017   CORONARY/GRAFT ACUTE MI REVASCULARIZATION N/A 12/22/2020   Procedure: Coronary/Graft Acute MI Revascularization;  Surgeon: Alwyn Pea, MD;  Location: [facility] INVASIVE CV LAB;  Service: Cardiovascular;  Laterality: N/A;   HERNIA REPAIR  2021   LEFT HEART CATH AND CORONARY ANGIOGRAPHY N/A 12/22/2020   Procedure: LEFT HEART CATH AND CORONARY ANGIOGRAPHY;  Surgeon: Alwyn Pea, MD;  Location: [facility] INVASIVE CV LAB;  Service: Cardiovascular;  Laterality: N/A;   LEFT HEART CATH AND CORONARY ANGIOGRAPHY N/A 07/21/2021   Procedure: LEFT HEART CATH AND CORONARY ANGIOGRAPHY with possible Intervention;  Surgeon: Alwyn Pea, MD;  Location: [facility] INVASIVE CV LAB;  Service: Cardiovascular;  Laterality: N/A;    Family History  Problem Relation Age of Onset   Heart disease Mother    Hypertension Mother    Obesity Mother    Vision loss Mother    Alcohol abuse Father    Cancer Brother    Diabetes Sister    Varicose Veins Daughter     Social History   Socioeconomic History   Marital status: Married    Spouse name: Not on file   Number of children: Not on file   Years of education: Not on file   Highest education level: Bachelor's degree (e.g., BA, AB, BS)  Occupational History   Not on file  Tobacco Use   Smoking status: Never   Smokeless tobacco: Never  Vaping Use   Vaping status: Never Used  Substance and Sexual Activity   Alcohol use: Not Currently   Drug use: Never   Sexual activity: Yes   Other Topics Concern   Not on file  Social History Narrative   Not on file   Social Determinants of Health   Financial Resource Strain: Low Risk  (09/04/2023)   Overall Financial Resource Strain (CARDIA)    Difficulty of Paying Living Expenses: Not hard at all  Food Insecurity: No Food Insecurity (09/04/2023)   Hunger Vital Sign    Worried About Running Out of Food in the Last Year: Never true    Ran Out of Food in the Last Year: Never true  Transportation Needs: No Transportation Needs (09/04/2023)   PRAPARE - Administrator, Civil Service (Medical): No    Lack of Transportation (Non-Medical): No  Physical Activity: Inactive (09/04/2023)   Exercise Vital Sign    Days of Exercise per Week: 0 days    Minutes of Exercise per Session: 30 min  Stress: Stress Concern Present (09/04/2023)   [employer] of Occupational Health - Occupational Stress Questionnaire    Feeling of Stress : To some extent  Social Connections: Socially Integrated (09/04/2023)   Social Connection and Isolation Panel [NHANES]    Frequency of Communication with Friends and Family: More than three times a week    Frequency of Social Gatherings with Friends and Family: More than three times a week    Attends Religious Services: More than 4 times per year    Active Member of [employer] or Organizations: Yes    Attends Engineer, structural: More than 4 times per year    Marital Status: Married  Catering manager Violence: Not At Risk (04/29/2023)   Humiliation, Afraid, Rape, and Kick questionnaire    Fear of Current or Ex-Partner: No    Emotionally Abused: No    Physically Abused: No    Sexually Abused: No    Review of Systems  Constitutional:  Negative for chills and fever.  Eyes:  Negative for blurred vision.  Respiratory:  Negative for cough, shortness of breath and wheezing.   Cardiovascular:  Negative for chest pain and palpitations.  Gastrointestinal:  Positive for abdominal pain. Negative  for blood in stool, heartburn, melena, nausea and vomiting.  Neurological:  Negative for dizziness.      Objective    There were no vitals taken for this visit.  Physical Exam Constitutional:      Appearance: Normal appearance.  HENT:     Head: Normocephalic and atraumatic.  Eyes:     Conjunctiva/sclera: Conjunctivae normal.  Cardiovascular:     Rate and Rhythm: Normal rate and regular rhythm.  Pulmonary:     Effort: Pulmonary effort is normal.     Breath sounds: Normal breath sounds.  Musculoskeletal:     Right lower leg: No edema.     Left lower leg: No edema.  Skin:    General: Skin is warm and dry.  Neurological:     General: No focal deficit present.     Mental Status: Karen Madden is alert. Mental status is at baseline.  Psychiatric:        Mood and Affect: Mood normal.        Behavior: Behavior normal.       Assessment & Plan:   1. Primary hypertension/Chronic heart failure with preserved ejection fraction [facility]): Blood pressure stable, euvolemic on exam. Continue Lasix 20 mg, HCTZ 25 mg, KCl 20 meQ, Metoprolol 25 mg. Following with Cardiology later this week. Follow up here in about 4 months.   2. Hypothyroidism, unspecified type: TSH stable on labs, continue Levothyroxine 75 mcg.   3. Anemia, unspecified type: Vitamin B12 low, now on supplements. Following with GI, being treated for H pylori.   4. Mild episode of recurrent major depressive disorder (HCC): Stable, continue Wellbutrin 300 mg.    No follow-ups on file.   Margarita Mail, DO

## 2023-09-06 ENCOUNTER — Encounter: Payer: Self-pay | Admitting: Internal Medicine

## 2023-09-06 ENCOUNTER — Ambulatory Visit (INDEPENDENT_AMBULATORY_CARE_PROVIDER_SITE_OTHER): Payer: PPO | Admitting: Internal Medicine

## 2023-09-06 VITALS — BP 122/82 | HR 77 | Temp 97.8°F | Wt 172.8 lb

## 2023-09-06 DIAGNOSIS — N390 Urinary tract infection, site not specified: Secondary | ICD-10-CM

## 2023-09-06 DIAGNOSIS — G4733 Obstructive sleep apnea (adult) (pediatric): Secondary | ICD-10-CM | POA: Diagnosis not present

## 2023-09-06 DIAGNOSIS — K219 Gastro-esophageal reflux disease without esophagitis: Secondary | ICD-10-CM | POA: Diagnosis not present

## 2023-09-06 DIAGNOSIS — M25511 Pain in right shoulder: Secondary | ICD-10-CM

## 2023-09-06 DIAGNOSIS — R079 Chest pain, unspecified: Secondary | ICD-10-CM | POA: Diagnosis not present

## 2023-09-06 DIAGNOSIS — Z955 Presence of coronary angioplasty implant and graft: Secondary | ICD-10-CM | POA: Diagnosis not present

## 2023-09-06 DIAGNOSIS — Z01811 Encounter for preprocedural respiratory examination: Secondary | ICD-10-CM | POA: Diagnosis not present

## 2023-09-06 DIAGNOSIS — I5032 Chronic diastolic (congestive) heart failure: Secondary | ICD-10-CM | POA: Diagnosis not present

## 2023-09-06 DIAGNOSIS — I2510 Atherosclerotic heart disease of native coronary artery without angina pectoris: Secondary | ICD-10-CM | POA: Diagnosis not present

## 2023-09-06 DIAGNOSIS — R300 Dysuria: Secondary | ICD-10-CM | POA: Diagnosis not present

## 2023-09-06 DIAGNOSIS — E782 Mixed hyperlipidemia: Secondary | ICD-10-CM | POA: Diagnosis not present

## 2023-09-06 DIAGNOSIS — I10 Essential (primary) hypertension: Secondary | ICD-10-CM | POA: Diagnosis not present

## 2023-09-06 DIAGNOSIS — Z01818 Encounter for other preprocedural examination: Secondary | ICD-10-CM | POA: Diagnosis not present

## 2023-09-06 DIAGNOSIS — I2102 ST elevation (STEMI) myocardial infarction involving left anterior descending coronary artery: Secondary | ICD-10-CM | POA: Diagnosis not present

## 2023-09-06 DIAGNOSIS — I255 Ischemic cardiomyopathy: Secondary | ICD-10-CM | POA: Diagnosis not present

## 2023-09-06 LAB — POCT URINALYSIS DIPSTICK
Bilirubin, UA: NEGATIVE
Blood, UA: NEGATIVE
Glucose, UA: NEGATIVE
Ketones, UA: NEGATIVE
Nitrite, UA: NEGATIVE
Odor: NORMAL
Protein, UA: NEGATIVE
Spec Grav, UA: 1.02 (ref 1.010–1.025)
Urobilinogen, UA: 0.2 U/dL
pH, UA: 6.5 (ref 5.0–8.0)

## 2023-09-07 LAB — URINE CULTURE
MICRO NUMBER:: 15472250
SPECIMEN QUALITY:: ADEQUATE

## 2023-09-14 DIAGNOSIS — Z85038 Personal history of other malignant neoplasm of large intestine: Secondary | ICD-10-CM | POA: Diagnosis not present

## 2023-09-14 DIAGNOSIS — G8918 Other acute postprocedural pain: Secondary | ICD-10-CM | POA: Diagnosis not present

## 2023-09-14 DIAGNOSIS — Z7982 Long term (current) use of aspirin: Secondary | ICD-10-CM | POA: Diagnosis not present

## 2023-09-14 DIAGNOSIS — E119 Type 2 diabetes mellitus without complications: Secondary | ICD-10-CM | POA: Diagnosis not present

## 2023-09-14 DIAGNOSIS — E039 Hypothyroidism, unspecified: Secondary | ICD-10-CM | POA: Diagnosis not present

## 2023-09-14 DIAGNOSIS — I255 Ischemic cardiomyopathy: Secondary | ICD-10-CM | POA: Diagnosis not present

## 2023-09-14 DIAGNOSIS — I10 Essential (primary) hypertension: Secondary | ICD-10-CM | POA: Diagnosis not present

## 2023-09-14 DIAGNOSIS — M75121 Complete rotator cuff tear or rupture of right shoulder, not specified as traumatic: Secondary | ICD-10-CM | POA: Diagnosis not present

## 2023-09-14 DIAGNOSIS — Z955 Presence of coronary angioplasty implant and graft: Secondary | ICD-10-CM | POA: Diagnosis not present

## 2023-09-14 DIAGNOSIS — M19011 Primary osteoarthritis, right shoulder: Secondary | ICD-10-CM | POA: Diagnosis not present

## 2023-09-14 DIAGNOSIS — I129 Hypertensive chronic kidney disease with stage 1 through stage 4 chronic kidney disease, or unspecified chronic kidney disease: Secondary | ICD-10-CM | POA: Diagnosis not present

## 2023-09-14 DIAGNOSIS — N183 Chronic kidney disease, stage 3 unspecified: Secondary | ICD-10-CM | POA: Diagnosis not present

## 2023-09-14 DIAGNOSIS — E785 Hyperlipidemia, unspecified: Secondary | ICD-10-CM | POA: Diagnosis not present

## 2023-09-14 DIAGNOSIS — I2510 Atherosclerotic heart disease of native coronary artery without angina pectoris: Secondary | ICD-10-CM | POA: Diagnosis not present

## 2023-09-14 DIAGNOSIS — G4733 Obstructive sleep apnea (adult) (pediatric): Secondary | ICD-10-CM | POA: Diagnosis not present

## 2023-09-17 DIAGNOSIS — M25611 Stiffness of right shoulder, not elsewhere classified: Secondary | ICD-10-CM | POA: Diagnosis not present

## 2023-09-17 DIAGNOSIS — M25511 Pain in right shoulder: Secondary | ICD-10-CM | POA: Diagnosis not present

## 2023-09-20 ENCOUNTER — Ambulatory Visit: Payer: PPO | Admitting: Internal Medicine

## 2023-09-21 ENCOUNTER — Ambulatory Visit: Payer: PPO | Admitting: Internal Medicine

## 2023-09-21 ENCOUNTER — Encounter: Payer: Self-pay | Admitting: Internal Medicine

## 2023-09-21 DIAGNOSIS — M25511 Pain in right shoulder: Secondary | ICD-10-CM | POA: Diagnosis not present

## 2023-09-21 DIAGNOSIS — M25611 Stiffness of right shoulder, not elsewhere classified: Secondary | ICD-10-CM | POA: Diagnosis not present

## 2023-09-22 ENCOUNTER — Ambulatory Visit: Payer: PPO | Admitting: Urology

## 2023-09-23 DIAGNOSIS — M25611 Stiffness of right shoulder, not elsewhere classified: Secondary | ICD-10-CM | POA: Diagnosis not present

## 2023-09-23 DIAGNOSIS — M25511 Pain in right shoulder: Secondary | ICD-10-CM | POA: Diagnosis not present

## 2023-09-27 DIAGNOSIS — M25511 Pain in right shoulder: Secondary | ICD-10-CM | POA: Diagnosis not present

## 2023-09-27 DIAGNOSIS — M25611 Stiffness of right shoulder, not elsewhere classified: Secondary | ICD-10-CM | POA: Diagnosis not present

## 2023-09-29 DIAGNOSIS — M25511 Pain in right shoulder: Secondary | ICD-10-CM | POA: Diagnosis not present

## 2023-09-29 DIAGNOSIS — M25611 Stiffness of right shoulder, not elsewhere classified: Secondary | ICD-10-CM | POA: Diagnosis not present

## 2023-10-07 DIAGNOSIS — M25611 Stiffness of right shoulder, not elsewhere classified: Secondary | ICD-10-CM | POA: Diagnosis not present

## 2023-10-07 DIAGNOSIS — M25511 Pain in right shoulder: Secondary | ICD-10-CM | POA: Diagnosis not present

## 2023-10-12 DIAGNOSIS — M25511 Pain in right shoulder: Secondary | ICD-10-CM | POA: Diagnosis not present

## 2023-10-12 DIAGNOSIS — M25611 Stiffness of right shoulder, not elsewhere classified: Secondary | ICD-10-CM | POA: Diagnosis not present

## 2023-10-15 DIAGNOSIS — M25611 Stiffness of right shoulder, not elsewhere classified: Secondary | ICD-10-CM | POA: Diagnosis not present

## 2023-10-15 DIAGNOSIS — M25511 Pain in right shoulder: Secondary | ICD-10-CM | POA: Diagnosis not present

## 2023-10-19 DIAGNOSIS — M25611 Stiffness of right shoulder, not elsewhere classified: Secondary | ICD-10-CM | POA: Diagnosis not present

## 2023-10-19 DIAGNOSIS — M25511 Pain in right shoulder: Secondary | ICD-10-CM | POA: Diagnosis not present

## 2023-10-21 DIAGNOSIS — M25511 Pain in right shoulder: Secondary | ICD-10-CM | POA: Diagnosis not present

## 2023-10-21 DIAGNOSIS — M25611 Stiffness of right shoulder, not elsewhere classified: Secondary | ICD-10-CM | POA: Diagnosis not present

## 2023-10-26 DIAGNOSIS — M25611 Stiffness of right shoulder, not elsewhere classified: Secondary | ICD-10-CM | POA: Diagnosis not present

## 2023-10-26 DIAGNOSIS — M25511 Pain in right shoulder: Secondary | ICD-10-CM | POA: Diagnosis not present

## 2023-10-27 ENCOUNTER — Other Ambulatory Visit: Payer: Self-pay | Admitting: Internal Medicine

## 2023-10-28 DIAGNOSIS — M25611 Stiffness of right shoulder, not elsewhere classified: Secondary | ICD-10-CM | POA: Diagnosis not present

## 2023-10-28 DIAGNOSIS — M25511 Pain in right shoulder: Secondary | ICD-10-CM | POA: Diagnosis not present

## 2023-10-28 NOTE — Telephone Encounter (Signed)
Requested medications are due for refill today.  yes  Requested medications are on the active medications list.  yes  Last refill. 04/15/2023 #90 1 rf  Future visit scheduled.   no  Notes to clinic.  Labs are expired.    Requested Prescriptions  Pending Prescriptions Disp Refills   ezetimibe (ZETIA) 10 MG tablet [Pharmacy Med Name: EZETIMIBE 10 MG TABLET] 90 tablet 1    Sig: TAKE 1 TABLET BY MOUTH EVERY DAY     Cardiovascular:  Antilipid - Sterol Transport Inhibitors Failed - 10/27/2023  4:40 PM      Failed - AST in normal range and within 360 days    AST  Date Value Ref Range Status  08/13/2022 19 10 - 35 U/L Final         Failed - ALT in normal range and within 360 days    ALT  Date Value Ref Range Status  08/13/2022 19 6 - 29 U/L Final         Failed - Lipid Panel in normal range within the last 12 months    Cholesterol  Date Value Ref Range Status  12/22/2020 191 0 - 200 mg/dL Final   LDL Cholesterol  Date Value Ref Range Status  12/22/2020 92 0 - 99 mg/dL Final    Comment:           Total Cholesterol/HDL:CHD Risk Coronary Heart Disease Risk Table                     Men   Women  1/2 Average Risk   3.4   3.3  Average Risk       5.0   4.4  2 X Average Risk   9.6   7.1  3 X Average Risk  23.4   11.0        Use the calculated Patient Ratio above and the CHD Risk Table to determine the patient's CHD Risk.        ATP III CLASSIFICATION (LDL):  <100     mg/dL   Optimal  213-086  mg/dL   Near or Above                    Optimal  130-159  mg/dL   Borderline  578-469  mg/dL   High  >629     mg/dL   Very High Performed at [facility], [address] [place], [place] [address]    HDL  Date Value Ref Range Status  12/22/2020 70 >40 mg/dL Final   Triglycerides  Date Value Ref Range Status  12/22/2020 144 <150 mg/dL Final         Passed - Patient is not pregnant      Passed - Valid encounter within last 12 months    Recent Outpatient Visits            1 month ago Pre-op evaluation   [facility] Margarita Mail, DO   2 months ago Dysuria   [facility] Danelle Berry, PA-C   5 months ago Urinary tract infection without hematuria, site unspecified   [facility] Mecum, Oswaldo Conroy, PA-C   7 months ago Callus of foot   [facility] Margarita Mail, DO   9 months ago Acute cystitis without hematuria   [facility] Margarita Mail, DO       Future Appointments  In 1 month Margarita Mail, DO [facility], [facility]

## 2023-11-02 ENCOUNTER — Other Ambulatory Visit: Payer: Self-pay | Admitting: Internal Medicine

## 2023-11-02 DIAGNOSIS — M25611 Stiffness of right shoulder, not elsewhere classified: Secondary | ICD-10-CM | POA: Diagnosis not present

## 2023-11-02 DIAGNOSIS — M25511 Pain in right shoulder: Secondary | ICD-10-CM | POA: Diagnosis not present

## 2023-11-03 NOTE — Telephone Encounter (Signed)
Requested medication (s) are due for refill today: yes  Requested medication (s) are on the active medication list: yes  Last refill:  07/29/23  Future visit scheduled: yes  Notes to clinic:  Unable to refill per protocol due to failed labs, no updated TSH results.      Requested Prescriptions  Pending Prescriptions Disp Refills   levothyroxine (SYNTHROID) 75 MCG tablet [Pharmacy Med Name: LEVOTHYROXINE 75 MCG TABLET] 90 tablet 0    Sig: TAKE 1 TABLET BY MOUTH EVERY DAY     Endocrinology:  Hypothyroid Agents Failed - 11/02/2023  1:23 AM      Failed - TSH in normal range and within 360 days    TSH  Date Value Ref Range Status  08/13/2022 2.13 0.40 - 4.50 mIU/L Final         Passed - Valid encounter within last 12 months    Recent Outpatient Visits           1 month ago Pre-op evaluation   [facility] Margarita Mail, DO   2 months ago Dysuria   [facility] Danelle Berry, PA-C   5 months ago Urinary tract infection without hematuria, site unspecified   [facility] M[facility]um, Oswaldo Conroy, PA-C   7 months ago Callus of foot   [facility] Margarita Mail, DO   9 months ago Acute cystitis without hematuria   [facility] Margarita Mail, DO       Future Appointments             In 1 month Margarita Mail, DO [facility], [facility]

## 2023-11-04 DIAGNOSIS — M25611 Stiffness of right shoulder, not elsewhere classified: Secondary | ICD-10-CM | POA: Diagnosis not present

## 2023-11-04 DIAGNOSIS — M25511 Pain in right shoulder: Secondary | ICD-10-CM | POA: Diagnosis not present

## 2023-11-09 ENCOUNTER — Telehealth: Payer: Self-pay | Admitting: Pharmacist

## 2023-11-09 DIAGNOSIS — M25611 Stiffness of right shoulder, not elsewhere classified: Secondary | ICD-10-CM | POA: Diagnosis not present

## 2023-11-09 DIAGNOSIS — M25511 Pain in right shoulder: Secondary | ICD-10-CM | POA: Diagnosis not present

## 2023-11-09 NOTE — Telephone Encounter (Signed)
Discussed with patient via MyChart. She had stopped atorvastatin thinking that she did not need it and ezetimibe. Discussed the importance of taking both for LDL control. She agreed to restart atorvastatin.   Catie Eppie Gibson, PharmD, BCACP, CPP Clinical Pharmacist [facility] [phone]

## 2023-11-09 NOTE — Telephone Encounter (Signed)
Pharmacy Quality Measure Review  This patient is appearing on a report for being at risk of failing the adherence measure for cholesterol (statin) medications this calendar year.   Medication: atorvastatin 10 mg  Last fill date: 05/24/23 for 90 day supply  Left voicemail for patient to return my call at their convenience. Per chart review, this is prescribed by Dr. Juliann Pares. Patient was seen 9/16 for a preop visit - appears atorvastatin is on her medication list, but it was documented that she is not taking it.   Will send MyChart message.   Catie Eppie Gibson, PharmD, BCACP, CPP Clinical Pharmacist [facility] [phone]

## 2023-11-11 ENCOUNTER — Encounter: Payer: Self-pay | Admitting: Internal Medicine

## 2023-11-11 ENCOUNTER — Ambulatory Visit (INDEPENDENT_AMBULATORY_CARE_PROVIDER_SITE_OTHER): Payer: PPO | Admitting: Physician Assistant

## 2023-11-11 ENCOUNTER — Encounter: Payer: Self-pay | Admitting: Physician Assistant

## 2023-11-11 VITALS — BP 126/74 | HR 72 | Resp 16 | Ht 64.0 in | Wt 173.6 lb

## 2023-11-11 DIAGNOSIS — N390 Urinary tract infection, site not specified: Secondary | ICD-10-CM

## 2023-11-11 DIAGNOSIS — N898 Other specified noninflammatory disorders of vagina: Secondary | ICD-10-CM | POA: Diagnosis not present

## 2023-11-11 DIAGNOSIS — R319 Hematuria, unspecified: Secondary | ICD-10-CM | POA: Diagnosis not present

## 2023-11-11 DIAGNOSIS — M25511 Pain in right shoulder: Secondary | ICD-10-CM | POA: Diagnosis not present

## 2023-11-11 DIAGNOSIS — R300 Dysuria: Secondary | ICD-10-CM | POA: Diagnosis not present

## 2023-11-11 DIAGNOSIS — M25611 Stiffness of right shoulder, not elsewhere classified: Secondary | ICD-10-CM | POA: Diagnosis not present

## 2023-11-11 LAB — POCT URINALYSIS DIPSTICK
Bilirubin, UA: NEGATIVE
Glucose, UA: NEGATIVE
Ketones, UA: NEGATIVE
Nitrite, UA: NEGATIVE
Protein, UA: NEGATIVE
Spec Grav, UA: 1.01 (ref 1.010–1.025)
Urobilinogen, UA: 0.2 U/dL
pH, UA: 6 (ref 5.0–8.0)

## 2023-11-11 MED ORDER — SULFAMETHOXAZOLE-TRIMETHOPRIM 800-160 MG PO TABS
1.0000 | ORAL_TABLET | Freq: Two times a day (BID) | ORAL | 0 refills | Status: AC
Start: 2023-11-11 — End: 2023-11-16

## 2023-11-11 MED ORDER — PHENAZOPYRIDINE HCL 100 MG PO TABS
100.0000 mg | ORAL_TABLET | Freq: Three times a day (TID) | ORAL | 0 refills | Status: DC | PRN
Start: 2023-11-11 — End: 2024-05-31

## 2023-11-11 NOTE — Progress Notes (Signed)
Acute Office Visit   Patient: Karen Madden   DOB: [birth year]   77 y.o. Female  MRN: [id number] Visit Date: 11/11/2023  Today's healthcare provider: Oswaldo Conroy Tunya Held, PA-C  Introduced myself to the patient as a Secondary school teacher and provided education on APPs in clinical practice.    Chief Complaint  Patient presents with   Urinary Frequency    Sx onset for a week   Dysuria   Urinary Retention   Subjective    HPI HPI     Urinary Frequency    Additional comments: Sx onset for a week      Last edited by Forde Radon, CMA on 11/11/2023  1:03 PM.       Concern for UTI  Onset: sudden  Duration: She reports her symptoms maybe started over the weekend  Associated symptoms: she reports she is having mild intermittent burning with urination, increased frequency and some hesitancy   Interventions: AZO   She does reports vulvovaginal itching - she has been using Vagisil to assist with this    Medications: Outpatient Medications Prior to Visit  Medication Sig   aspirin 81 MG chewable tablet Chew 81 mg by mouth daily.   atorvastatin (LIPITOR) 10 MG tablet    buPROPion (WELLBUTRIN XL) 300 MG 24 hr tablet TAKE 1 TABLET BY MOUTH EVERY DAY   Cholecalciferol 125 MCG (5000 UT) TABS Take 5,000 Units by mouth daily.   cyanocobalamin (VITAMIN B12) 1000 MCG tablet Take 1 tablet (1,000 mcg total) by mouth daily.   ezetimibe (ZETIA) 10 MG tablet TAKE 1 TABLET BY MOUTH EVERY DAY   famotidine (PEPCID) 20 MG tablet Take 20 mg by mouth 2 (two) times daily.   furosemide (LASIX) 20 MG tablet TAKE 1/2 TABLET BY MOUTH DAILY   gabapentin (NEURONTIN) 400 MG capsule TAKE 1 CAPSULE BY MOUTH AT BEDTIME.   hydrochlorothiazide (HYDRODIURIL) 25 MG tablet Take 25 mg by mouth daily.   KLOR-CON M20 20 MEQ tablet Take 20 mEq by mouth daily.   levothyroxine (SYNTHROID) 75 MCG tablet TAKE 1 TABLET BY MOUTH EVERY DAY   loperamide (IMODIUM A-D) 2 MG tablet Take 2 mg by mouth 4 (four) times daily as  needed for diarrhea or loose stools.   Multiple Vitamins-Minerals (PRESERVISION AREDS) TABS Take 1 tablet by mouth daily.   nitroGLYCERIN (NITROSTAT) 0.4 MG SL tablet Place 0.4 mg under the tongue every 2 (two) hours as needed for chest pain.   diphenhydrAMINE (SIMPLY SLEEP) 25 MG tablet Take 25 mg by mouth at bedtime as needed for sleep.   metoprolol succinate (TOPROL-XL) 25 MG 24 hr tablet Take 0.5 tablets (12.5 mg total) by mouth daily. (Patient taking differently: Take 25 mg by mouth daily.)   mupirocin ointment (BACTROBAN) 2 % Apply 1 Application topically 2 (two) times daily.   omeprazole (PRILOSEC) 20 MG capsule Take 1 capsule (20 mg total) by mouth 2 (two) times daily before a meal for 14 days.   ondansetron (ZOFRAN-ODT) 4 MG disintegrating tablet Take 1 tablet (4 mg total) by mouth every 8 (eight) hours as needed for nausea or vomiting.   triamcinolone (KENALOG) 0.025 % cream Apply 1 application  topically daily as needed (Rash).   No facility-administered medications prior to visit.    Review of Systems  Constitutional:  Negative for chills and fever.  Gastrointestinal:  Negative for nausea and vomiting.  Genitourinary:  Positive for dysuria and frequency. Negative for difficulty urinating, enuresis, flank pain,  hematuria, vaginal bleeding, vaginal discharge and vaginal pain.        Objective    BP 126/74   Pulse 72   Resp 16   Ht 5\' 4"  (1.626 m)   Wt 173 lb 9.6 oz (78.7 kg)   SpO2 98%   BMI 29.80 kg/m     Physical Exam Vitals reviewed.  Constitutional:      General: She is awake.     Appearance: Normal appearance. She is well-developed and well-groomed.  HENT:     Head: Normocephalic and atraumatic.  Eyes:     General: Lids are normal. Gaze aligned appropriately.     Extraocular Movements: Extraocular movements intact.     Conjunctiva/sclera: Conjunctivae normal.  Pulmonary:     Effort: Pulmonary effort is normal.  Neurological:     General: No focal deficit  present.     Mental Status: She is alert and oriented to person, place, and time.     GCS: GCS eye subscore is 4. GCS verbal subscore is 5. GCS motor subscore is 6.     Cranial Nerves: No cranial nerve deficit, dysarthria or facial asymmetry.  Psychiatric:        Attention and Perception: Attention and perception normal.        Mood and Affect: Mood and affect normal.        Speech: Speech normal.        Behavior: Behavior normal. Behavior is cooperative.       Results for orders placed or performed in visit on 11/11/23  POCT urinalysis dipstick  Result Value Ref Range   Color, UA Yellow    Clarity, UA Clear    Glucose, UA Negative Negative   Bilirubin, UA Negative    Ketones, UA Negative    Spec Grav, UA 1.010 1.010 - 1.025   Blood, UA Trace    pH, UA 6.0 5.0 - 8.0   Protein, UA Negative Negative   Urobilinogen, UA 0.2 0.2 or 1.0 E.U./dL   Nitrite, UA Negative    Leukocytes, UA Large (3+) (A) Negative   Appearance clear    Odor foul     Assessment & Plan      No follow-ups on file.      Problem List Items Addressed This Visit   None Visit Diagnoses     Dysuria    -  Primary   Relevant Medications   phenazopyridine (PYRIDIUM) 100 MG tablet   Other Relevant Orders   POCT urinalysis dipstick (Completed)   Urine Culture   Urinary tract infection with hematuria, site unspecified       Relevant Medications   sulfamethoxazole-trimethoprim (BACTRIM DS) 800-160 MG tablet   phenazopyridine (PYRIDIUM) 100 MG tablet   Other Relevant Orders   Urine Culture   Vaginal itching       Relevant Orders   Cervicovaginal ancillary only      Acute, new problem Patient reports symptoms comprised of the following: dysuria, pressure, incomplete voiding, increased urinary frequency, since the weekend Results of UA are consistent with UTI - urine sample sent for culture to determine causative organism and susceptibility- results to dictate further management  Reviewed results of  urine dip with patient during apt  Will provide script for Bactrim - discussed importance of finishing entire course of abx and staying well hydrated while recovering from UTI  most recent eGFR is 39- UTD does not recommend renal dosing adjustment at this level  Reviewed ED precautions with patient Follow up  as needed for persistent or worsening symptoms   No follow-ups on file.   I, Amalio Loe E Alton Bouknight, PA-C, have reviewed all documentation for this visit. The documentation on 11/11/23 for the exam, diagnosis, procedures, and orders are all accurate and complete.   Jacquelin Hawking, MHS, PA-C [facility]

## 2023-11-12 DIAGNOSIS — M25511 Pain in right shoulder: Secondary | ICD-10-CM | POA: Diagnosis not present

## 2023-11-14 LAB — URINE CULTURE
MICRO NUMBER:: 15763646
SPECIMEN QUALITY:: ADEQUATE

## 2023-11-14 NOTE — Progress Notes (Signed)
Your urine culture results are back.  The culture results show that there was a bacteria called Proteus Mirabilis that was causing your UTI.  This bacteria was found to be susceptible to the antibiotic that you were started on.  Please make sure that you finish the entire course and stay well-hydrated.  Please let us know if you have further questions or concerns

## 2023-11-22 ENCOUNTER — Encounter: Payer: Self-pay | Admitting: Internal Medicine

## 2023-11-24 ENCOUNTER — Ambulatory Visit: Payer: Self-pay | Admitting: *Deleted

## 2023-11-24 NOTE — Telephone Encounter (Signed)
  Chief Complaint: worsening cough, chest tightness , congestion hx pneumonia Symptoms: non productive cough , "can't get anything up" congested cough, chest tightness with coughing  Frequency: cough since Monday chest tightness since last night  Pertinent Negatives: Patient denies chest pain no difficulty breathing no fever,  Disposition: [] ED /[] Urgent Care (no appt availability in office) / [x] Appointment(In office/virtual)/ []  [place] [facility]/ [] Home Care/ [] Refused Recommended Disposition /[]  Mobile Bus/ []  Follow-up with PCP Additional Notes:   Recommended ED for chest tightness and worsening sx. Patient already scheduled for 11/25/23. No available appt today with any provider. Patient requesting medication . Please advise if patient needs to be seen before tomorrow.      Reason for Disposition  Patient sounds very sick or weak to the triager  Answer Assessment - Initial Assessment Questions 1. ONSET: "When did the cough begin?"      Monday 2. SEVERITY: "How bad is the cough today?"      Worsening  3. SPUTUM: "Describe the color of your sputum" (none, dry cough; clear, white, yellow, green)     None  4. HEMOPTYSIS: "Are you coughing up any blood?" If so ask: "How much?" (flecks, streaks, tablespoons, etc.)     na 5. DIFFICULTY BREATHING: "Are you having difficulty breathing?" If Yes, ask: "How bad is it?" (e.g., mild, moderate, severe)    - MILD: No SOB at rest, mild SOB with walking, speaks normally in sentences, can lie down, no retractions, pulse < 100.    - MODERATE: SOB at rest, SOB with minimal exertion and prefers to sit, cannot lie down flat, speaks in phrases, mild retractions, audible wheezing, pulse 100-120.    - SEVERE: Very SOB at rest, speaks in single words, struggling to breathe, sitting hunched forward, retractions, pulse > 120      Denies  6. FEVER: "Do you have a fever?" If Yes, ask: "What is your temperature, how was it measured, and when did  it start?"     No  7. CARDIAC HISTORY: "Do you have any history of heart disease?" (e.g., heart attack, congestive heart failure)      Hx heart attack  8. LUNG HISTORY: "Do you have any history of lung disease?"  (e.g., pulmonary embolus, asthma, emphysema)     no 9. PE RISK FACTORS: "Do you have a history of blood clots?" (or: recent major surgery, recent prolonged travel, bedridden)     na 10. OTHER SYMPTOMS: "Do you have any other symptoms?" (e.g., runny nose, wheezing, chest pain)       Cough , non productive .  11. PREGNANCY: "Is there any chance you are pregnant?" "When was your last menstrual period?"       na 12. TRAVEL: "Have you traveled out of the country in the last month?" (e.g., travel history, exposures)       na  Protocols used: Cough - Acute Non-Productive-A-AH

## 2023-11-25 ENCOUNTER — Ambulatory Visit (INDEPENDENT_AMBULATORY_CARE_PROVIDER_SITE_OTHER): Payer: PPO | Admitting: Physician Assistant

## 2023-11-25 ENCOUNTER — Encounter: Payer: Self-pay | Admitting: Physician Assistant

## 2023-11-25 VITALS — BP 120/70 | HR 83 | Temp 98.0°F | Resp 16 | Ht 64.0 in | Wt 175.0 lb

## 2023-11-25 DIAGNOSIS — J069 Acute upper respiratory infection, unspecified: Secondary | ICD-10-CM | POA: Diagnosis not present

## 2023-11-25 MED ORDER — BENZONATATE 100 MG PO CAPS
100.0000 mg | ORAL_CAPSULE | Freq: Two times a day (BID) | ORAL | 0 refills | Status: DC | PRN
Start: 2023-11-25 — End: 2023-12-06

## 2023-11-25 NOTE — Progress Notes (Signed)
Acute Office Visit   Patient: Karen Madden   DOB: [birth year]   77 y.o. Female  MRN: [id number] Visit Date: 11/25/2023  Today's healthcare provider: Oswaldo Conroy Beatriz Settles, PA-C  Introduced myself to the patient as a Secondary school teacher and provided education on APPs in clinical practice.    Chief Complaint  Patient presents with   Cough    Productive. Lost voice yesterday, but came back today. Feels "crackling"   Subjective    HPI HPI     Cough    Additional comments: Productive. Lost voice yesterday, but came back today. Feels "crackling"      Last edited by Dollene Primrose, CMA on 11/25/2023 10:30 AM.        URI -type symptoms   Onset: sudden  Duration: started on Tuesday with cough Associated symptoms: Dry cough, chest tightness, voice changes, some sternal chest pain- started last week,fatigue and weakness  Reports last night her coughing was less frequent  Intervention: Tylenol cold day formulation- thinks it provided relief of symptoms   Recent sick contacts: reports her granddaughter had coughing on Sat  Recent travel: none  COVID testing at home: She has not tested for COVID at home   Result: NA   Medications: Outpatient Medications Prior to Visit  Medication Sig   aspirin 81 MG chewable tablet Chew 81 mg by mouth daily.   atorvastatin (LIPITOR) 10 MG tablet    buPROPion (WELLBUTRIN XL) 300 MG 24 hr tablet TAKE 1 TABLET BY MOUTH EVERY DAY   Cholecalciferol 125 MCG (5000 UT) TABS Take 5,000 Units by mouth daily.   cyanocobalamin (VITAMIN B12) 1000 MCG tablet Take 1 tablet (1,000 mcg total) by mouth daily.   ezetimibe (ZETIA) 10 MG tablet TAKE 1 TABLET BY MOUTH EVERY DAY   famotidine (PEPCID) 20 MG tablet Take 20 mg by mouth 2 (two) times daily.   furosemide (LASIX) 20 MG tablet TAKE 1/2 TABLET BY MOUTH DAILY   gabapentin (NEURONTIN) 400 MG capsule TAKE 1 CAPSULE BY MOUTH AT BEDTIME.   hydrochlorothiazide (HYDRODIURIL) 25 MG tablet Take 25 mg by mouth daily.    KLOR-CON M20 20 MEQ tablet Take 20 mEq by mouth daily.   levothyroxine (SYNTHROID) 75 MCG tablet TAKE 1 TABLET BY MOUTH EVERY DAY   loperamide (IMODIUM A-D) 2 MG tablet Take 2 mg by mouth 4 (four) times daily as needed for diarrhea or loose stools.   Multiple Vitamins-Minerals (PRESERVISION AREDS) TABS Take 1 tablet by mouth daily.   nitroGLYCERIN (NITROSTAT) 0.4 MG SL tablet Place 0.4 mg under the tongue every 2 (two) hours as needed for chest pain.   phenazopyridine (PYRIDIUM) 100 MG tablet Take 1 tablet (100 mg total) by mouth 3 (three) times daily as needed for pain.   diphenhydrAMINE (SIMPLY SLEEP) 25 MG tablet Take 25 mg by mouth at bedtime as needed for sleep.   metoprolol succinate (TOPROL-XL) 25 MG 24 hr tablet Take 0.5 tablets (12.5 mg total) by mouth daily. (Patient taking differently: Take 25 mg by mouth daily.)   mupirocin ointment (BACTROBAN) 2 % Apply 1 Application topically 2 (two) times daily.   omeprazole (PRILOSEC) 20 MG capsule Take 1 capsule (20 mg total) by mouth 2 (two) times daily before a meal for 14 days.   ondansetron (ZOFRAN-ODT) 4 MG disintegrating tablet Take 1 tablet (4 mg total) by mouth every 8 (eight) hours as needed for nausea or vomiting.   triamcinolone (KENALOG) 0.025 % cream Apply 1  application  topically daily as needed (Rash).   No facility-administered medications prior to visit.    Review of Systems  Constitutional:  Positive for activity change and fatigue. Negative for chills and fever.  HENT:  Negative for congestion, ear pain, postnasal drip, rhinorrhea, sinus pressure, sinus pain and sore throat.   Respiratory:  Positive for cough and chest tightness. Negative for shortness of breath and wheezing.   Gastrointestinal:  Positive for nausea. Negative for diarrhea and vomiting.  Musculoskeletal:  Positive for myalgias.  Neurological:  Positive for headaches.        Objective    BP 120/70   Pulse 83   Temp 98 F (36.7 C) (Oral)   Resp 16    Ht 5\' 4"  (1.626 m)   Wt 175 lb (79.4 kg)   SpO2 95%   BMI 30.04 kg/m     Physical Exam Vitals reviewed.  Constitutional:      Appearance: Normal appearance.  HENT:     Head: Normocephalic and atraumatic.     Right Ear: There is impacted cerumen.     Left Ear: There is impacted cerumen.     Mouth/Throat:     Lips: Pink.     Mouth: Mucous membranes are moist.     Pharynx: Oropharynx is clear. Uvula midline. No pharyngeal swelling, oropharyngeal exudate, posterior oropharyngeal erythema, uvula swelling or postnasal drip.  Eyes:     General: Lids are normal. Gaze aligned appropriately.     Extraocular Movements: Extraocular movements intact.     Conjunctiva/sclera: Conjunctivae normal.  Cardiovascular:     Rate and Rhythm: Normal rate and regular rhythm.     Pulses: Normal pulses.     Heart sounds: Normal heart sounds. Heart sounds not distant. No murmur heard.    No friction rub. No gallop.  Pulmonary:     Effort: Pulmonary effort is normal.     Breath sounds: Normal breath sounds. No decreased air movement. No decreased breath sounds, wheezing, rhonchi or rales.  Musculoskeletal:     Cervical back: Normal range of motion and neck supple.  Lymphadenopathy:     Head:     Right side of head: No submental, submandibular or preauricular adenopathy.     Left side of head: No submental, submandibular or preauricular adenopathy.     Cervical:     Right cervical: No superficial cervical adenopathy.    Left cervical: No superficial cervical adenopathy.     Upper Body:     Right upper body: No supraclavicular adenopathy.     Left upper body: No supraclavicular adenopathy.  Skin:    General: Skin is warm.  Neurological:     General: No focal deficit present.     Mental Status: She is alert and oriented to person, place, and time.  Psychiatric:        Mood and Affect: Mood normal.        Behavior: Behavior normal.        Thought Content: Thought content normal.        Judgment:  Judgment normal.       No results found for any visits on 11/25/23.  Assessment & Plan      No follow-ups on file.      Problem List Items Addressed This Visit   None Visit Diagnoses     Viral upper respiratory tract infection    -  Primary   Relevant Medications   benzonatate (TESSALON) 100 MG capsule  Visit with patient indicates symptoms comprised of dry cough, myalgias, fatigue, headaches since Tuesday  congruent with acute URI that is likely viral in nature  Due to nature and duration of symptoms recommended treatment regimen is symptomatic relief and follow up if needed Will send in tessalon perls to assist with coughing  Discussed with patient the various viral and bacterial etiologies of current illness and appropriate course of treatment Discussed OTC medication options for multisymptom relief such as Dayquil/Nyquil, Theraflu, AlkaSeltzer, etc. Discussed return precautions if symptoms are not improving or worsen over next 5-7 days.     No follow-ups on file.   I, Jarrod Bodkins E Javonne Louissaint, PA-C, have reviewed all documentation for this visit. The documentation on 11/25/23 for the exam, diagnosis, procedures, and orders are all accurate and complete.   Jacquelin Hawking, MHS, PA-C [facility]

## 2023-11-25 NOTE — Patient Instructions (Signed)

## 2023-11-27 ENCOUNTER — Other Ambulatory Visit: Payer: Self-pay | Admitting: Internal Medicine

## 2023-11-27 DIAGNOSIS — F330 Major depressive disorder, recurrent, mild: Secondary | ICD-10-CM

## 2023-11-27 DIAGNOSIS — I5032 Chronic diastolic (congestive) heart failure: Secondary | ICD-10-CM

## 2023-11-30 NOTE — Telephone Encounter (Signed)
Requested medications are due for refill today.  Yes - both  Requested medications are on the active medications list.  yes  Last refill. Lasix 06/04/2023 #30 2 rf, Wellbutrin 08/25/2020 #90 0 rf  Future visit scheduled.   yes  Notes to clinic.  Labs are expired.    Requested Prescriptions  Pending Prescriptions Disp Refills   buPROPion (WELLBUTRIN XL) 300 MG 24 hr tablet [Pharmacy Med Name: BUPROPION HCL XL 300 MG TABLET] 90 tablet 0    Sig: TAKE 1 TABLET BY MOUTH EVERY DAY     Psychiatry: Antidepressants - bupropion Failed - 11/27/2023 11:49 AM      Failed - Cr in normal range and within 360 days    Creat  Date Value Ref Range Status  08/13/2022 1.40 (H) 0.60 - 1.00 mg/dL Final         Failed - AST in normal range and within 360 days    AST  Date Value Ref Range Status  08/13/2022 19 10 - 35 U/L Final         Failed - ALT in normal range and within 360 days    ALT  Date Value Ref Range Status  08/13/2022 19 6 - 29 U/L Final         Passed - Completed PHQ-2 or PHQ-9 in the last 360 days      Passed - Last BP in normal range    BP Readings from Last 1 Encounters:  11/25/23 120/70         Passed - Valid encounter within last 6 months    Recent Outpatient Visits           5 days ago Viral upper respiratory tract infection   [place] [facility] M[facility]um, Oswaldo Conroy, PA-C   2 weeks ago Dysuria   [facility] Mecum, Oswaldo Conroy, PA-C   2 months ago Pre-op evaluation   [facility] Margarita Mail, DO   3 months ago Dysuria   [facility] Danelle Berry, PA-C   6 months ago Urinary tract infection without hematuria, site unspecified   [facility] Mecum, Oswaldo Conroy, PA-C       Future Appointments             In 6 days Margarita Mail, DO [place] [facility]             furosemide (LASIX) 20 MG tablet [Pharmacy Med Name:  FUROSEMIDE 20 MG TABLET] 45 tablet 1    Sig: TAKE 1/2 TABLET BY MOUTH DAILY     Cardiovascular:  Diuretics - Loop Failed - 11/27/2023 11:49 AM      Failed - K in normal range and within 180 days    Potassium  Date Value Ref Range Status  08/13/2022 4.1 3.5 - 5.3 mmol/L Final         Failed - Ca in normal range and within 180 days    Calcium  Date Value Ref Range Status  08/13/2022 9.7 8.6 - 10.4 mg/dL Final         Failed - Na in normal range and within 180 days    Sodium  Date Value Ref Range Status  08/13/2022 141 135 - 146 mmol/L Final         Failed - Cr in normal range and within 180 days    Creat  Date Value Ref Range Status  08/13/2022 1.40 (H)  0.60 - 1.00 mg/dL Final         Failed - Cl in normal range and within 180 days    Chloride  Date Value Ref Range Status  08/13/2022 102 98 - 110 mmol/L Final         Failed - Mg Level in normal range and within 180 days    Magnesium  Date Value Ref Range Status  12/24/2020 2.1 1.7 - 2.4 mg/dL Final    Comment:    Performed at [facility], [address], [place], [place] [address]         Passed - Last BP in normal range    BP Readings from Last 1 Encounters:  11/25/23 120/70         Passed - Valid encounter within last 6 months    Recent Outpatient Visits           5 days ago Viral upper respiratory tract infection   [facility] Mecum, Oswaldo Conroy, PA-C   2 weeks ago Dysuria   [facility] Mecum, Oswaldo Conroy, PA-C   2 months ago Pre-op evaluation   [facility] Margarita Mail, DO   3 months ago Dysuria   [facility] Danelle Berry, PA-C   6 months ago Urinary tract infection without hematuria, site unspecified   [facility] Mecum, Oswaldo Conroy, PA-C       Future Appointments             In 6 days Margarita Mail, DO [facility]

## 2023-12-01 ENCOUNTER — Encounter: Payer: Self-pay | Admitting: Physician Assistant

## 2023-12-01 NOTE — Telephone Encounter (Signed)
Recommend follow up in office to assess lungs and vitals

## 2023-12-05 NOTE — Progress Notes (Unsigned)
Established Patient Office Visit  Subjective   Patient ID: Karen Madden, female    DOB: [birth year]  Age: 77 y.o. MRN: [id number]  No chief complaint on file.   HPI  Patient is here for follow up on chronic medical conditions.   Hx of STEMI/HTN/OSA/CHFpEF: -Medications: Lasix 20 mg, HCTZ 25 mg, KCl 20 meQ, Metoprolol 25 mg, nitroglycerin PRN -Patient is compliant with above medications and reports no side effects. -Checking BP at home (average): 120-130/80 -Denies any SOB, CP, vision changes, LE edema or symptoms of hypotension -Diet: Working on diet, has gained 10 pounds -Most recent echo 6/22 EF >55% -Hx of STEMI 12/22/20 - 1 stent in LAD, following with Cardiology -Most recent cath 08/09/21 showing widely patent stent in LAD, EF 60% -Working on CPAP right now for OSA  CAD/HLD: -Medications: aspirin 81, Plavix 75 mg, Lipitor 80 mg (held for 90 days per Cardiology recommendation to see if fatigue resolves, uncertain if she did this), Zetia 10 mg  -Patient is compliant with above medications and reports no side effects.  -Last lipid panel:Lipid Panel     Component Value Date/Time   CHOL 191 12/22/2020 1521   TRIG 144 12/22/2020 1521   HDL 70 12/22/2020 1521   CHOLHDL 2.7 12/22/2020 1521   VLDL 29 12/22/2020 1521   LDLCALC 92 12/22/2020 1521    Hypothyroidism: -Medications: Levothyroxine 75 mcg  -Patient is compliant with the above medication (s) at the above dose and reports no medication side effects.  -Denies weight changes, cold./heat intolerance, skin changes, anxiety/palpitations  -Last TSH: 2.13 8/23  GERD: -Currently using Pepcid 20 mg over the counter PRN  Hx of Colon Cancer:  -First diagnosed in 2016 - had a large polyp on cecum. Then hgb was low, passed out. Had a second tumor removed in 2017. Had a partial colectomy.  -Last CBC showing hgb 10.3 in 2/23 -Following with GI - planning on EGD and repeat colonoscopy and if negative a capsule study of the small  bowel. Last by GI on 09/01/22. H. Pylori positive, about to start treatment.   MDD: -Mood status: stable -Current treatment: Wellbutrin 300 mg  -Satisfied with current treatment?: yes -Symptom severity: mild  -Duration of current treatment : years -Side effects: no Medication compliance: excellent compliance     11/25/2023   10:26 AM 11/11/2023   12:55 PM 09/06/2023    1:26 PM 08/17/2023   11:53 AM 05/11/2023    2:12 PM  Depression screen PHQ 2/9  Decreased Interest 0 0 0 0 0  Down, Depressed, Hopeless 0 0 0 0 0  PHQ - 2 Score 0 0 0 0 0  Altered sleeping  0 0 0 0  Tired, decreased energy  0 0 0 0  Change in appetite  0 0 0 0  Feeling bad or failure about yourself   0 0 0 0  Trouble concentrating  0 0 0 0  Moving slowly or fidgety/restless  0 0 0 0  Suicidal thoughts  0 0 0 0  PHQ-9 Score  0 0 0 0  Difficult doing work/chores  Not difficult at all  Not difficult at all Not difficult at all      Health Maintenance: -Blood work UTD -Mammogram 3/23 Birads-1  {History (Optional):23778}  ROS    Objective:     There were no vitals taken for this visit. {Vitals History (Optional):23777}  Physical Exam   No results found for any visits on 12/06/23.  {Labs (Optional):23779}  The ASCVD Risk score (Arnett DK, et al., 2019) failed to calculate for the following reasons:   Risk score cannot be calculated because patient has a medical history suggesting prior/existing ASCVD    Assessment & Plan:  There are no diagnoses linked to this encounter.   No follow-ups on file.    Margarita Mail, DO

## 2023-12-06 ENCOUNTER — Other Ambulatory Visit: Payer: Self-pay

## 2023-12-06 ENCOUNTER — Encounter: Payer: Self-pay | Admitting: Internal Medicine

## 2023-12-06 ENCOUNTER — Ambulatory Visit (INDEPENDENT_AMBULATORY_CARE_PROVIDER_SITE_OTHER): Payer: PPO | Admitting: Internal Medicine

## 2023-12-06 VITALS — BP 124/82 | HR 82 | Temp 98.3°F | Resp 16 | Ht 64.0 in | Wt 172.2 lb

## 2023-12-06 DIAGNOSIS — I2510 Atherosclerotic heart disease of native coronary artery without angina pectoris: Secondary | ICD-10-CM

## 2023-12-06 DIAGNOSIS — I10 Essential (primary) hypertension: Secondary | ICD-10-CM | POA: Diagnosis not present

## 2023-12-06 DIAGNOSIS — E039 Hypothyroidism, unspecified: Secondary | ICD-10-CM

## 2023-12-06 DIAGNOSIS — E876 Hypokalemia: Secondary | ICD-10-CM

## 2023-12-06 DIAGNOSIS — R0982 Postnasal drip: Secondary | ICD-10-CM | POA: Diagnosis not present

## 2023-12-06 DIAGNOSIS — G2581 Restless legs syndrome: Secondary | ICD-10-CM | POA: Diagnosis not present

## 2023-12-06 DIAGNOSIS — F330 Major depressive disorder, recurrent, mild: Secondary | ICD-10-CM

## 2023-12-06 MED ORDER — GABAPENTIN 400 MG PO CAPS: 400.0000 mg | ORAL_CAPSULE | Freq: Every day | ORAL | 3 refills | Status: DC

## 2023-12-06 MED ORDER — HYDROCHLOROTHIAZIDE 25 MG PO TABS: 25.0000 mg | ORAL_TABLET | Freq: Every day | ORAL | 1 refills | Status: DC

## 2023-12-06 NOTE — Assessment & Plan Note (Signed)
Recheck lipid panel today.  Continue aspirin, Lipitor 80 mg and Zetia 10 mg, appropriate refill sent to pharmacy.

## 2023-12-06 NOTE — Assessment & Plan Note (Signed)
Stable, doing well on Wellbutrin 300 mg XL daily.

## 2023-12-06 NOTE — Assessment & Plan Note (Signed)
Blood pressure stable here today, no changes made to medications and appropriate refills sent to pharmacy.  Labs due.  Will need to refill potassium after labs result.

## 2023-12-06 NOTE — Assessment & Plan Note (Signed)
Stable, refill gabapentin 400 mg at night.

## 2023-12-06 NOTE — Assessment & Plan Note (Signed)
Symptomatically stable, recheck TSH today.  Continue levothyroxine 75 mcg daily.

## 2023-12-06 NOTE — Patient Instructions (Addendum)
It was great seeing you today!  Plan discussed at today's visit: -Blood work ordered today, results will be uploaded to MyChart.  -Recommend zinc and vitamin c to boost immune system, also recommend nasal saline and nasal steroid (Flonase) over the counter -Medications refilled  Follow up in: 6 months   Take care and let us know if you have any questions or concerns prior to your next visit.  Dr. Caralee Ates

## 2023-12-07 LAB — CBC WITH DIFFERENTIAL/PLATELET
Absolute Lymphocytes: 1562 {cells}/uL (ref 850–3900)
Absolute Monocytes: 748 {cells}/uL (ref 200–950)
Basophils Absolute: 77 {cells}/uL (ref 0–200)
Basophils Relative: 0.7 %
Eosinophils Absolute: 143 {cells}/uL (ref 15–500)
Eosinophils Relative: 1.3 %
HCT: 41.1 % (ref 35.0–45.0)
Hemoglobin: 13.6 g/dL (ref 11.7–15.5)
MCH: 29.1 pg (ref 27.0–33.0)
MCHC: 33.1 g/dL (ref 32.0–36.0)
MCV: 88 fL (ref 80.0–100.0)
MPV: 8.8 fL (ref 7.5–12.5)
Monocytes Relative: 6.8 %
Neutro Abs: 8470 {cells}/uL — ABNORMAL HIGH (ref 1500–7800)
Neutrophils Relative %: 77 %
Platelets: 408 10*3/uL — ABNORMAL HIGH (ref 140–400)
RBC: 4.67 10*6/uL (ref 3.80–5.10)
RDW: 13 % (ref 11.0–15.0)
Total Lymphocyte: 14.2 %
WBC: 11 10*3/uL — ABNORMAL HIGH (ref 3.8–10.8)

## 2023-12-07 LAB — COMPLETE METABOLIC PANEL WITH GFR
AG Ratio: 1.9 (calc) (ref 1.0–2.5)
ALT: 15 U/L (ref 6–29)
AST: 18 U/L (ref 10–35)
Albumin: 4.4 g/dL (ref 3.6–5.1)
Alkaline phosphatase (APISO): 80 U/L (ref 37–153)
BUN/Creatinine Ratio: 14 (calc) (ref 6–22)
BUN: 15 mg/dL (ref 7–25)
CO2: 30 mmol/L (ref 20–32)
Calcium: 9.5 mg/dL (ref 8.6–10.4)
Chloride: 92 mmol/L — ABNORMAL LOW (ref 98–110)
Creat: 1.06 mg/dL — ABNORMAL HIGH (ref 0.60–1.00)
Globulin: 2.3 g/dL (ref 1.9–3.7)
Glucose, Bld: 90 mg/dL (ref 65–99)
Potassium: 3.3 mmol/L — ABNORMAL LOW (ref 3.5–5.3)
Sodium: 135 mmol/L (ref 135–146)
Total Bilirubin: 0.4 mg/dL (ref 0.2–1.2)
Total Protein: 6.7 g/dL (ref 6.1–8.1)
eGFR: 54 mL/min/{1.73_m2} — ABNORMAL LOW (ref >=60)

## 2023-12-07 LAB — LIPID PANEL
Cholesterol: 117 mg/dL (ref <200)
HDL: 52 mg/dL (ref >=50)
LDL Cholesterol (Calc): 40 mg/dL
Non-HDL Cholesterol (Calc): 65 mg/dL (ref <130)
Total CHOL/HDL Ratio: 2.3 (calc) (ref <5.0)
Triglycerides: 176 mg/dL — ABNORMAL HIGH (ref <150)

## 2023-12-07 LAB — TSH: TSH: 1.78 m[IU]/L (ref 0.40–4.50)

## 2023-12-07 MED ORDER — KLOR-CON M20 20 MEQ PO TBCR
20.0000 meq | EXTENDED_RELEASE_TABLET | Freq: Every day | ORAL | 1 refills | Status: AC
Start: 2023-12-07 — End: ?

## 2023-12-07 MED ORDER — LEVOTHYROXINE SODIUM 75 MCG PO TABS: 75.0000 ug | ORAL_TABLET | Freq: Every day | ORAL | 1 refills | Status: DC

## 2023-12-07 NOTE — Addendum Note (Signed)
Addended by: Margarita Mail on: 12/07/2023 07:56 AM   Modules accepted: Orders

## 2023-12-08 ENCOUNTER — Other Ambulatory Visit: Payer: Self-pay | Admitting: Internal Medicine

## 2023-12-08 DIAGNOSIS — I2510 Atherosclerotic heart disease of native coronary artery without angina pectoris: Secondary | ICD-10-CM

## 2023-12-08 MED ORDER — ATORVASTATIN CALCIUM 10 MG PO TABS
10.0000 mg | ORAL_TABLET | Freq: Every day | ORAL | 1 refills | Status: AC
Start: 2023-12-08 — End: ?

## 2023-12-08 NOTE — Progress Notes (Signed)
Spoke with pt and verbalized understanding

## 2023-12-17 DIAGNOSIS — M898X1 Other specified disorders of bone, shoulder: Secondary | ICD-10-CM | POA: Diagnosis not present

## 2023-12-17 DIAGNOSIS — Z96611 Presence of right artificial shoulder joint: Secondary | ICD-10-CM | POA: Diagnosis not present

## 2023-12-30 DIAGNOSIS — M25511 Pain in right shoulder: Secondary | ICD-10-CM | POA: Diagnosis not present

## 2023-12-30 DIAGNOSIS — Z96611 Presence of right artificial shoulder joint: Secondary | ICD-10-CM | POA: Diagnosis not present

## 2024-01-07 DIAGNOSIS — M25511 Pain in right shoulder: Secondary | ICD-10-CM | POA: Diagnosis not present

## 2024-01-07 DIAGNOSIS — Z96611 Presence of right artificial shoulder joint: Secondary | ICD-10-CM | POA: Diagnosis not present

## 2024-01-31 DIAGNOSIS — Z961 Presence of intraocular lens: Secondary | ICD-10-CM | POA: Diagnosis not present

## 2024-01-31 DIAGNOSIS — H353131 Nonexudative age-related macular degeneration, bilateral, early dry stage: Secondary | ICD-10-CM | POA: Diagnosis not present

## 2024-02-03 DIAGNOSIS — M25511 Pain in right shoulder: Secondary | ICD-10-CM | POA: Diagnosis not present

## 2024-02-03 DIAGNOSIS — Z96611 Presence of right artificial shoulder joint: Secondary | ICD-10-CM | POA: Diagnosis not present

## 2024-02-18 ENCOUNTER — Other Ambulatory Visit: Payer: Self-pay | Admitting: Internal Medicine

## 2024-02-18 ENCOUNTER — Encounter: Payer: Self-pay | Admitting: Internal Medicine

## 2024-02-21 ENCOUNTER — Other Ambulatory Visit: Payer: Self-pay | Admitting: Internal Medicine

## 2024-02-21 DIAGNOSIS — G2581 Restless legs syndrome: Secondary | ICD-10-CM

## 2024-02-21 MED ORDER — GABAPENTIN 100 MG PO CAPS
200.0000 mg | ORAL_CAPSULE | Freq: Every day | ORAL | 3 refills | Status: DC
Start: 2024-02-21 — End: 2024-06-27

## 2024-02-26 ENCOUNTER — Other Ambulatory Visit: Payer: Self-pay | Admitting: Internal Medicine

## 2024-02-27 ENCOUNTER — Other Ambulatory Visit: Payer: Self-pay | Admitting: Internal Medicine

## 2024-02-27 DIAGNOSIS — F330 Major depressive disorder, recurrent, mild: Secondary | ICD-10-CM

## 2024-02-28 NOTE — Telephone Encounter (Signed)
 Request too soon for refill, last refill 10/29/23 for 90 and 1 refill.  Requested Prescriptions  Pending Prescriptions Disp Refills   ezetimibe (ZETIA) 10 MG tablet [Pharmacy Med Name: EZETIMIBE 10 MG TABLET] 90 tablet 1    Sig: TAKE 1 TABLET BY MOUTH EVERY DAY     Cardiovascular:  Antilipid - Sterol Transport Inhibitors Failed - 02/28/2024 12:26 PM      Failed - Lipid Panel in normal range within the last 12 months    Cholesterol  Date Value Ref Range Status  12/06/2023 117 <200 mg/dL Final   LDL Cholesterol (Calc)  Date Value Ref Range Status  12/06/2023 40 mg/dL (calc) Final    Comment:    Reference range: <100 . Desirable range <100 mg/dL for primary prevention;   <70 mg/dL for patients with CHD or diabetic patients  with > or = 2 CHD risk factors. Marland Kitchen LDL-C is now calculated using the Martin-Hopkins  calculation, which is a validated novel method providing  better accuracy than the Friedewald equation in the  estimation of LDL-C.  Horald Pollen et al. Lenox Ahr. 1610;960(45): 2061-2068  (http://education.[employer].com/faq/FAQ164)    HDL  Date Value Ref Range Status  12/06/2023 52 > OR = 50 mg/dL Final   Triglycerides  Date Value Ref Range Status  12/06/2023 176 (H) <150 mg/dL Final         Passed - AST in normal range and within 360 days    AST  Date Value Ref Range Status  12/06/2023 18 10 - 35 U/L Final         Passed - ALT in normal range and within 360 days    ALT  Date Value Ref Range Status  12/06/2023 15 6 - 29 U/L Final         Passed - Patient is not pregnant      Passed - Valid encounter within last 12 months    Recent Outpatient Visits           2 months ago Primary hypertension   [place] [facility] Margarita Mail, DO   3 months ago Viral upper respiratory tract infection   [place] [facility] Mecum, Oswaldo Conroy, PA-C   3 months ago Dysuria   [facility] Mecum, Oswaldo Conroy, PA-C   5  months ago Pre-op evaluation   [facility] Margarita Mail, DO   6 months ago Dysuria   [facility] Danelle Berry, [place]       Future Appointments             In 3 months Margarita Mail, DO [facility], [facility]

## 2024-02-28 NOTE — Telephone Encounter (Signed)
 Requested Prescriptions  Pending Prescriptions Disp Refills   buPROPion (WELLBUTRIN XL) 300 MG 24 hr tablet [Pharmacy Med Name: BUPROPION HCL XL 300 MG TABLET] 90 tablet 0    Sig: TAKE 1 TABLET BY MOUTH EVERY DAY     Psychiatry: Antidepressants - bupropion Failed - 02/28/2024  4:54 PM      Failed - Cr in normal range and within 360 days    Creat  Date Value Ref Range Status  12/06/2023 1.06 (H) 0.60 - 1.00 mg/dL Final         Passed - AST in normal range and within 360 days    AST  Date Value Ref Range Status  12/06/2023 18 10 - 35 U/L Final         Passed - ALT in normal range and within 360 days    ALT  Date Value Ref Range Status  12/06/2023 15 6 - 29 U/L Final         Passed - Completed PHQ-2 or PHQ-9 in the last 360 days      Passed - Last BP in normal range    BP Readings from Last 1 Encounters:  12/06/23 124/82         Passed - Valid encounter within last 6 months    Recent Outpatient Visits           2 months ago Primary hypertension   [facility] Margarita Mail, DO   3 months ago Viral upper respiratory tract infection   [facility] Mecum, Oswaldo Conroy, PA-C   3 months ago Dysuria   [facility] Mecum, Oswaldo Conroy, PA-C   5 months ago Pre-op evaluation   [facility] Margarita Mail, DO   6 months ago Dysuria   [facility] Danelle Berry, [place]       Future Appointments             In 3 months Margarita Mail, DO [facility], [facility]

## 2024-03-13 ENCOUNTER — Encounter: Payer: Self-pay | Admitting: Internal Medicine

## 2024-03-31 DIAGNOSIS — L814 Other melanin hyperpigmentation: Secondary | ICD-10-CM | POA: Diagnosis not present

## 2024-03-31 DIAGNOSIS — L821 Other seborrheic keratosis: Secondary | ICD-10-CM | POA: Diagnosis not present

## 2024-04-18 ENCOUNTER — Encounter: Payer: Self-pay | Admitting: Internal Medicine

## 2024-04-18 ENCOUNTER — Other Ambulatory Visit: Payer: Self-pay

## 2024-04-18 ENCOUNTER — Ambulatory Visit (INDEPENDENT_AMBULATORY_CARE_PROVIDER_SITE_OTHER): Admitting: Internal Medicine

## 2024-04-18 VITALS — BP 130/70 | HR 73 | Temp 98.0°F | Resp 16 | Ht 64.0 in | Wt 173.4 lb

## 2024-04-18 DIAGNOSIS — G2581 Restless legs syndrome: Secondary | ICD-10-CM | POA: Diagnosis not present

## 2024-04-18 DIAGNOSIS — E663 Overweight: Secondary | ICD-10-CM | POA: Diagnosis not present

## 2024-04-18 DIAGNOSIS — H9193 Unspecified hearing loss, bilateral: Secondary | ICD-10-CM

## 2024-04-18 DIAGNOSIS — H6123 Impacted cerumen, bilateral: Secondary | ICD-10-CM

## 2024-04-18 NOTE — Patient Instructions (Signed)
 Pregabalin Capsules What is this medication? PREGABALIN (pre GAB a lin) treats nerve pain. It may also be used to prevent and control seizures in people with epilepsy. It works by calming overactive nerves in your body. This medicine may be used for other purposes; ask your health care provider or pharmacist if you have questions. COMMON BRAND NAME(S): Lyrica What should I tell my care team before I take this medication? They need to know if you have any of these conditions: Heart failure Kidney disease Lung disease Substance use disorder Suicidal thoughts, plans or attempt by you or a family member An unusual or allergic reaction to pregabalin, other medications, foods, dyes, or preservatives Pregnant or trying to get pregnant Breast-feeding How should I use this medication? Take this medication by mouth with water. Take it as directed on the prescription label at the same time every day. You can take it with or without food. If it upsets your stomach, take it with food. Keep taking it unless your care team tells you to stop. A special MedGuide will be given to you by the pharmacist with each prescription and refill. Be sure to read this information carefully each time. Talk to your care team about the use of this medication in children. While it may be prescribed for children as young as 1 month for selected conditions, precautions do apply. Overdosage: If you think you have taken too much of this medicine contact a poison control center or emergency room at once. NOTE: This medicine is only for you. Do not share this medicine with others. What if I miss a dose? If you miss a dose, take it as soon as you can. If it is almost time for your next dose, take only that dose. Do not take double or extra doses. What may interact with this medication? This medication may interact with the following: Alcohol Antihistamines for allergy, cough, and cold Certain medications for anxiety or  sleep Certain medications for blood pressure, heart disease Certain medications for depression like amitriptyline, fluoxetine, sertraline Certain medications for diabetes, like pioglitazone, rosiglitazone Certain medications for seizures like phenobarbital, primidone General anesthetics like halothane, isoflurane, methoxyflurane, propofol Medications that relax muscles for surgery Opioid medications for pain Phenothiazines like chlorpromazine, mesoridazine, prochlorperazine, thioridazine This list may not describe all possible interactions. Give your health care provider a list of all the medicines, herbs, non-prescription drugs, or dietary supplements you use. Also tell them if you smoke, drink alcohol, or use illegal drugs. Some items may interact with your medicine. What should I watch for while using this medication? Visit your care team for regular checks on your progress. Tell your care team if your symptoms do not start to get better or if they get worse. Do not suddenly stop taking this medication. You may develop a severe reaction. Your care team will tell you how much medication to take. If your care team wants you to stop the medication, the dose may be slowly lowered over time to avoid any side effects. This medication may affect your coordination, reaction time, or judgment. Do not drive or operate machinery until you know how this medication affects you. Sit up or stand slowly to reduce the risk of dizzy or fainting spells. Drinking alcohol with this medication can increase the risk of these side effects. If you or your family notice any changes in your behavior, such as new or worsening depression, thoughts of harming yourself, anxiety, other unusual or disturbing thoughts, or memory loss, call your  care team right away. Wear a medical ID bracelet or chain if you are taking this medication for seizures. Carry a card that describes your condition. List the medications and doses you take  on the card. This medication may make it more difficult to father a child. Talk to your care team if you are concerned about your fertility. What side effects may I notice from receiving this medication? Side effects that you should report to your care team as soon as possible: Allergic reactions or angioedema--skin rash, itching, hives, swelling of the face, eyes, lips, tongue, arms, or legs, trouble swallowing or breathing Blurry vision Thoughts of suicide or self-harm, worsening mood, feelings of depression Trouble breathing Side effects that usually do not require medical attention (report to your care team if they continue or are bothersome): Dizziness Drowsiness Dry mouth Nausea Swelling of the ankles, feet, hands Vomiting Weight gain This list may not describe all possible side effects. Call your doctor for medical advice about side effects. You may report side effects to FDA at 1-800-FDA-1088. Where should I keep my medication? Keep out of the reach of children and pets. This medication can be abused. Keep it in a safe place to protect it from theft. Do not share it with anyone. It is only for you. Selling or giving away this medication is dangerous and against the law. Store at [employer] [facility] (77 degrees F). Get rid of any unused medication after the expiration date. This medication may cause harm and death if it is taken by other adults, children, or pets. It is important to get rid of the medication as soon as you no longer need it, or it is expired. You can do this in two ways: Take the medication to a medication take-back program. Check with your pharmacy or law enforcement to find a location. If you cannot return the medication, check the label or package insert to see if the medication should be thrown out in the garbage or flushed down the toilet. If you are not sure, ask your care team. If it is safe to put it in the trash, take the medication out of the container. Mix the  medication with cat litter, dirt, coffee grounds, or other unwanted substance. Seal the mixture in a bag or container. Put it in the trash. NOTE: This sheet is a summary. It may not cover all possible information. If you have questions about this medicine, talk to your doctor, pharmacist, or health care provider.  2024 Elsevier/Gold Standard (2021-09-09 00:00:00)

## 2024-04-18 NOTE — Progress Notes (Signed)
 Acute Office Visit  Subjective:     Patient ID: Karen Madden, female    DOB: June 20, 1946, 78 y.o.   MRN: [id number]  Chief Complaint  Patient presents with   Obesity   Ear Fullness    HPI Patient is in today for weight gain and hearing issues.   Discussed the use of AI scribe software for clinical note transcription with the patient, who gave verbal consent to proceed.  History of Present Illness Karen Madden is a 78 year old female with a history of heart attack who presents with concerns about weight gain and hearing issues.  Her weight has increased from 162 pounds to 173-175 pounds since her heart attack three years ago. She attributes this to decreased activity post-surgery and medications such as gabapentin  and metoprolol . Her family history includes obesity, with her mother weighing 250-300 pounds. Her diet consists mainly of water, coffee, chicken, vegetables, and apples, avoiding bread, butter, and mayonnaise. Exercise is limited due to knee and shoulder issues.  She experiences dizziness and balance problems related to her ears, affecting her ability to walk long distances and requiring support in stores. She has not consulted an ENT specialist for her hearing issues. Ear wax buildup was noted in a previous visit.  Current medications include gabapentin , metoprolol , Wellbutrin , cholesterol medication, aspirin , and thyroid  medication. Her thyroid  levels were normal in December.    Review of Systems  HENT:  Positive for hearing loss. Negative for ear pain and tinnitus.   All other systems reviewed and are negative.       Objective:    BP 130/70 (Cuff Size: Large)   Pulse 73   Temp 98 F (36.7 C) (Oral)   Resp 16   Ht 5\' 4"  (1.626 m)   Wt 173 lb 6.4 oz (78.7 kg)   SpO2 94%   BMI 29.76 kg/m  BP Readings from Last 3 Encounters:  04/18/24 130/70  12/06/23 124/82  11/25/23 120/70   Wt Readings from Last 3 Encounters:  04/18/24 173 lb 6.4 oz (78.7 kg)   12/06/23 172 lb 3.2 oz (78.1 kg)  11/25/23 175 lb (79.4 kg)      Physical Exam Constitutional:      Appearance: Normal appearance.  HENT:     Head: Normocephalic and atraumatic.     Right Ear: Ear canal and external ear normal. There is impacted cerumen.     Left Ear: Ear canal and external ear normal. There is impacted cerumen.  Eyes:     Conjunctiva/sclera: Conjunctivae normal.  Cardiovascular:     Rate and Rhythm: Normal rate and regular rhythm.  Pulmonary:     Effort: Pulmonary effort is normal.     Breath sounds: Normal breath sounds.  Skin:    General: Skin is warm and dry.  Neurological:     General: No focal deficit present.     Mental Status: She is alert. Mental status is at baseline.  Psychiatric:        Mood and Affect: Mood normal.        Behavior: Behavior normal.     No results found for any visits on 04/18/24.      Assessment & Plan:   Assessment & Plan  Hearing issues with ear wax Ear wax buildup causing hearing issues. - Perform ear irrigation with warm water and peroxide unsuccessful, recommend home Debrox drops and if not sufficient will refer to ENT for wax removal.   Overweight BMI 29, weight gain post-heart  attack possibly due to medication and reduced activity. Emphasized nutrition and exercise for weight loss. - Discussed referral to nutritionist at [facility] which patient declines today. - Encourage higher protein intake, reduced carbohydrates and sugars. - Consider low-impact exercises like chair Pilates or water aerobics.  Restless legs syndrome RLS not fully controlled with gabapentin . Discussed Lyrica as alternative, decision to continue gabapentin . - Continue gabapentin  600 mg daily. - Consider Lyrica if insurance covers it at no cost.  - Ear Lavage   Return for already scheduled.  Rockney Cid, DO

## 2024-04-25 ENCOUNTER — Encounter: Payer: Self-pay | Admitting: Internal Medicine

## 2024-04-27 ENCOUNTER — Ambulatory Visit (INDEPENDENT_AMBULATORY_CARE_PROVIDER_SITE_OTHER): Admitting: Nurse Practitioner

## 2024-04-27 ENCOUNTER — Encounter: Payer: Self-pay | Admitting: Nurse Practitioner

## 2024-04-27 VITALS — BP 134/74 | HR 83 | Temp 97.8°F | Resp 18 | Ht 64.0 in | Wt 173.2 lb

## 2024-04-27 DIAGNOSIS — R300 Dysuria: Secondary | ICD-10-CM

## 2024-04-27 LAB — POCT URINALYSIS DIPSTICK
Bilirubin, UA: NEGATIVE
Blood, UA: NEGATIVE
Glucose, UA: NEGATIVE
Ketones, UA: NEGATIVE
Nitrite, UA: NEGATIVE
Odor: NEGATIVE
Protein, UA: NEGATIVE
Spec Grav, UA: 1.02 (ref 1.010–1.025)
Urobilinogen, UA: 0.2 U/dL
pH, UA: 6.5 (ref 5.0–8.0)

## 2024-04-27 MED ORDER — CEPHALEXIN 500 MG PO CAPS
500.0000 mg | ORAL_CAPSULE | Freq: Two times a day (BID) | ORAL | 0 refills | Status: DC
Start: 2024-04-27 — End: 2024-04-27

## 2024-04-27 MED ORDER — SULFAMETHOXAZOLE-TRIMETHOPRIM 800-160 MG PO TABS
1.0000 | ORAL_TABLET | Freq: Two times a day (BID) | ORAL | 0 refills | Status: AC
Start: 2024-04-27 — End: 2024-05-02

## 2024-04-27 NOTE — Progress Notes (Signed)
 BP 134/74   Pulse 83   Temp 97.8 F (36.6 C)   Resp 18   Ht 5\' 4"  (1.626 m)   Wt 173 lb 3.2 oz (78.6 kg)   SpO2 95%   BMI 29.73 kg/m    Subjective:    Patient ID: Karen Madden, female    DOB: 1946/11/19, 78 y.o.   MRN: [id number]  HPI: Karen Madden is a 78 y.o. female  Chief Complaint  Patient presents with   Dysuria   Urinary Frequency    Discussed the use of AI scribe software for clinical note transcription with the patient, who gave verbal consent to proceed.  History of Present Illness Karen Madden is a 78 year old female with recurrent UTIs who presents with dysuria and urinary frequency.  She experiences dysuria and increased urinary frequency. A urine dip test showed large leukocytes, indicating a possible infection. She has not had any UTIs this year but recalls having about three last year. No fever, low back pain, or vaginal discharge.  She has a history of colon cancer and underwent surgery, resulting in having half a colon. Due to this, she wears protective undergarments to prevent accidents, which she believes might contribute to her recurrent UTIs. She maintains good hygiene by using wet wipes after using the toilet.  She has experienced diarrhea with certain antibiotics in the past, specifically noting that Keflex  caused diarrhea, while Bactrim  was better tolerated. She takes Imodium  daily to manage diarrhea.  She is currently on Wellbutrin , which she started in 2017/05/06 following the death of her sister, and finds it helpful in managing stress and emotional distress. She reports not sleeping well due to recent stress from the passing of her brother, which has been emotionally challenging for her.         12/06/2023    1:40 PM 11/25/2023   10:26 AM 11/11/2023   12:55 PM  Depression screen PHQ 2/9  Decreased Interest 0 0 0  Down, Depressed, Hopeless 0 0 0  PHQ - 2 Score 0 0 0  Altered sleeping   0  Tired, decreased energy   0  Change in appetite   0   Feeling bad or failure about yourself    0  Trouble concentrating   0  Moving slowly or fidgety/restless   0  Suicidal thoughts   0  PHQ-9 Score   0  Difficult doing work/chores   Not difficult at all    Relevant past medical, surgical, family and social history reviewed and updated as indicated. Interim medical history since our last visit reviewed. Allergies and medications reviewed and updated.  Review of Systems  Ten systems reviewed and is negative except as mentioned in HPI      Objective:      BP 134/74   Pulse 83   Temp 97.8 F (36.6 C)   Resp 18   Ht 5\' 4"  (1.626 m)   Wt 173 lb 3.2 oz (78.6 kg)   SpO2 95%   BMI 29.73 kg/m    Wt Readings from Last 3 Encounters:  04/27/24 173 lb 3.2 oz (78.6 kg)  04/18/24 173 lb 6.4 oz (78.7 kg)  12/06/23 172 lb 3.2 oz (78.1 kg)    Physical Exam Vitals reviewed.  Constitutional:      Appearance: Normal appearance.  HENT:     Head: Normocephalic.  Cardiovascular:     Rate and Rhythm: Normal rate.  Pulmonary:     Effort: Pulmonary  effort is normal.  Abdominal:     Tenderness: There is no right CVA tenderness or left CVA tenderness.  Neurological:     General: No focal deficit present.     Mental Status: She is alert and oriented to person, place, and time. Mental status is at baseline.  Psychiatric:        Mood and Affect: Mood normal.        Behavior: Behavior normal.        Thought Content: Thought content normal.        Judgment: Judgment normal.        Results for orders placed or performed in visit on 04/27/24  POCT Urinalysis Dipstick   Collection Time: 04/27/24  1:36 PM  Result Value Ref Range   Color, UA yellow    Clarity, UA clear    Glucose, UA Negative Negative   Bilirubin, UA neg    Ketones, UA neg    Spec Grav, UA 1.020 1.010 - 1.025   Blood, UA neg    pH, UA 6.5 5.0 - 8.0   Protein, UA Negative Negative   Urobilinogen, UA 0.2 0.2 or 1.0 E.U./dL   Nitrite, UA neg    Leukocytes, UA Large  (3+) (A) Negative   Appearance clear    Odor neg           Assessment & Plan:   Problem List Items Addressed This Visit   None Visit Diagnoses       Dysuria    -  Primary   Relevant Orders   Urine Culture   POCT Urinalysis Dipstick (Completed)        Assessment and Plan Assessment & Plan   Urinary tract infection Acute UTI with dysuria and urinary frequency. Urine dipstick showed large leukocytes. No fever, low back pain, or CVA tenderness. History of recurrent UTIs, with three episodes last year. Adverse reactions to Keflex , including diarrhea, but tolerates Bactrim  well. Discussed potential prophylactic antibiotic therapy with urology. - Send urine for culture to confirm infection and identify causative organism. - Prescribe Bactrim  for UTI treatment, as she tolerates it better than Keflex . - Refer to urology for further evaluation and potential prophylactic antibiotic therapy to prevent recurrent UTIs.        Follow up plan: Return if symptoms worsen or fail to improve.

## 2024-04-28 LAB — URINE CULTURE
MICRO NUMBER:: 16431721
SPECIMEN QUALITY:: ADEQUATE

## 2024-05-01 ENCOUNTER — Encounter: Payer: Self-pay | Admitting: Nurse Practitioner

## 2024-05-07 ENCOUNTER — Other Ambulatory Visit: Payer: Self-pay | Admitting: Internal Medicine

## 2024-05-09 NOTE — Telephone Encounter (Signed)
 Requested Prescriptions  Pending Prescriptions Disp Refills   ezetimibe  (ZETIA ) 10 MG tablet [Pharmacy Med Name: EZETIMIBE  10 MG TABLET] 90 tablet 1    Sig: TAKE 1 TABLET BY MOUTH EVERY DAY     Cardiovascular:  Antilipid - Sterol Transport Inhibitors Failed - 05/09/2024  1:43 PM      Failed - Valid encounter within last 12 months    Recent Outpatient Visits           1 week ago Dysuria   [facility] Quinton Buckler, FNP   3 weeks ago Bilateral impacted cerumen   [facility] Rockney Cid, DO       Future Appointments             In 3 weeks Dustin Gimenez, MD [facility]rology [place]   In 3 weeks Rockney Cid, DO [facility]            Failed - Lipid Panel in normal range within the last 12 months    Cholesterol  Date Value Ref Range Status  12/06/2023 117 <200 mg/dL Final   LDL Cholesterol (Calc)  Date Value Ref Range Status  12/06/2023 40 mg/dL (calc) Final    Comment:    Reference range: <100 . Desirable range <100 mg/dL for primary prevention;   <70 mg/dL for patients with CHD or diabetic patients  with > or = 2 CHD risk factors. Aaron Aas LDL-C is now calculated using the Martin-Hopkins  calculation, which is a validated novel method providing  better accuracy than the Friedewald equation in the  estimation of LDL-C.  Melinda Sprawls et al. Erroll Heard. 1610;960(45): 2061-2068  (http://education.[employer].com/faq/FAQ164)    HDL  Date Value Ref Range Status  12/06/2023 52 > OR = 50 mg/dL Final   Triglycerides  Date Value Ref Range Status  12/06/2023 176 (H) <150 mg/dL Final         Passed - AST in normal range and within 360 days    AST  Date Value Ref Range Status  12/06/2023 18 10 - 35 U/L Final         Passed - ALT in normal range and within 360 days    ALT  Date Value Ref Range Status  12/06/2023 15 6 - 29 U/L Final         Passed - Patient is not  pregnant

## 2024-05-18 DIAGNOSIS — Z96611 Presence of right artificial shoulder joint: Secondary | ICD-10-CM | POA: Diagnosis not present

## 2024-05-18 DIAGNOSIS — M25511 Pain in right shoulder: Secondary | ICD-10-CM | POA: Diagnosis not present

## 2024-05-30 ENCOUNTER — Ambulatory Visit: Admitting: Oncology

## 2024-05-30 ENCOUNTER — Other Ambulatory Visit: Payer: PPO

## 2024-05-30 ENCOUNTER — Ambulatory Visit: Payer: PPO | Admitting: Internal Medicine

## 2024-05-30 ENCOUNTER — Other Ambulatory Visit

## 2024-05-30 ENCOUNTER — Other Ambulatory Visit: Payer: Self-pay | Admitting: Internal Medicine

## 2024-05-30 DIAGNOSIS — E876 Hypokalemia: Secondary | ICD-10-CM

## 2024-05-30 DIAGNOSIS — I5032 Chronic diastolic (congestive) heart failure: Secondary | ICD-10-CM

## 2024-05-30 NOTE — Telephone Encounter (Signed)
 Requested medication (s) are due for refill today:   Yes for both  Requested medication (s) are on the active medication list:   Yes for both  Future visit scheduled:   Yes 6/16    LOV 04/27/2024   Last ordered: Lasix  11/30/2023 #45, 1 refill;   potassium 12/07/2023 #90, 1 refill  Unable to refill because labs are due for both medications.   Requested Prescriptions  Pending Prescriptions Disp Refills   furosemide  (LASIX ) 20 MG tablet [Pharmacy Med Name: FUROSEMIDE  20 MG TABLET] 45 tablet 1    Sig: TAKE 1/2 TABLET BY MOUTH DAILY     Cardiovascular:  Diuretics - Loop Failed - 05/30/2024  3:43 PM      Failed - K in normal range and within 180 days    Potassium  Date Value Ref Range Status  12/06/2023 3.3 (L) 3.5 - 5.3 mmol/L Final         Failed - Cr in normal range and within 180 days    Creat  Date Value Ref Range Status  12/06/2023 1.06 (H) 0.60 - 1.00 mg/dL Final         Failed - Cl in normal range and within 180 days    Chloride  Date Value Ref Range Status  12/06/2023 92 (L) 98 - 110 mmol/L Final         Failed - Mg Level in normal range and within 180 days    Magnesium   Date Value Ref Range Status  12/24/2020 2.1 1.7 - 2.4 mg/dL Final    Comment:    Performed at [facility], [address] [place], [place] [address]         Failed - Valid encounter within last 6 months    Recent Outpatient Visits           1 month ago Dysuria   [facility] Quinton Buckler, FNP   1 month ago Bilateral impacted cerumen   [facility] Rockney Cid, DO       Future Appointments             Tomorrow Dustin Gimenez, MD [facility] [place]   In 6 days Rockney Cid, DO [facility], [facility]            Passed - Ca in normal range and within 180 days    Calcium   Date Value Ref Range Status  12/06/2023 9.5 8.6 - 10.4 mg/dL Final         Passed - Na in normal range  and within 180 days    Sodium  Date Value Ref Range Status  12/06/2023 135 135 - 146 mmol/L Final         Passed - Last BP in normal range    BP Readings from Last 1 Encounters:  04/27/24 134/74          KLOR-CON  M20 20 MEQ tablet [Pharmacy Med Name: KLOR-CON  M20 TABLET] 90 tablet 1    Sig: TAKE 1 TABLET BY MOUTH EVERY DAY     Endocrinology:  Minerals - Potassium Supplementation Failed - 05/30/2024  3:43 PM      Failed - K in normal range and within 360 days    Potassium  Date Value Ref Range Status  12/06/2023 3.3 (L) 3.5 - 5.3 mmol/L Final         Failed - Cr in normal range and within 360 days    Creat  Date Value Ref Range Status  12/06/2023 1.06 (H) 0.60 - 1.00 mg/dL Final         Failed - Valid encounter within last 12 months    Recent Outpatient Visits           1 month ago Dysuria   [facility] Quinton Buckler, FNP   1 month ago Bilateral impacted cerumen   [facility] Rockney Cid, [place]       Future Appointments             Tomorrow Dustin Gimenez, MD [facility] [place]   In 6 days Rockney Cid, DO [facility]

## 2024-05-31 ENCOUNTER — Ambulatory Visit
Admission: RE | Admit: 2024-05-31 | Discharge: 2024-05-31 | Disposition: A | Discharge status: Home / Self Care | Source: Ambulatory Visit | Attending: Urology | Admitting: Urology

## 2024-05-31 ENCOUNTER — Ambulatory Visit: Admitting: Urology

## 2024-05-31 ENCOUNTER — Ambulatory Visit
Admission: RE | Admit: 2024-05-31 | Discharge: 2024-05-31 | Disposition: A | Discharge status: Home / Self Care | Attending: Urology | Admitting: Urology

## 2024-05-31 VITALS — BP 155/79 | HR 75 | Ht 64.0 in | Wt 177.0 lb

## 2024-05-31 DIAGNOSIS — Z87442 Personal history of urinary calculi: Secondary | ICD-10-CM

## 2024-05-31 DIAGNOSIS — N390 Urinary tract infection, site not specified: Secondary | ICD-10-CM | POA: Insufficient documentation

## 2024-05-31 DIAGNOSIS — R300 Dysuria: Secondary | ICD-10-CM | POA: Diagnosis not present

## 2024-05-31 DIAGNOSIS — N200 Calculus of kidney: Secondary | ICD-10-CM | POA: Diagnosis not present

## 2024-05-31 LAB — URINALYSIS, COMPLETE
Bilirubin, UA: NEGATIVE
Glucose, UA: NEGATIVE
Ketones, UA: NEGATIVE
Nitrite, UA: NEGATIVE
Protein,UA: NEGATIVE
Specific Gravity, UA: <1.005 — ABNORMAL LOW (ref 1.005–1.030)
Urobilinogen, Ur: 0.2 mg/dL (ref 0.2–1.0)
pH, UA: 6 (ref 5.0–7.5)

## 2024-05-31 LAB — MICROSCOPIC EXAMINATION: Epithelial Cells (non renal): >10 /HPF — AB (ref 0–10)

## 2024-05-31 MED ORDER — ESTRADIOL 0.1 MG/GM VA CREA: TOPICAL_CREAM | VAGINAL | 12 refills | Status: DC

## 2024-05-31 NOTE — Progress Notes (Signed)
 Karen Madden,acting as a Neurosurgeon for Karen Gimenez, MD.,have documented all relevant documentation on the behalf of Karen Gimenez, MD,as directed by  Karen Gimenez, MD while in the presence of Karen Gimenez, MD.  05/31/24 2:50 PM   Karen Madden 1946-07-24 [id number]  Referring provider: Quinton Buckler, FNP [address] [place],  [place] [address]  Chief Complaint  Patient presents with   Dysuria    HPI: 78 year old female with a personal history of recurrent UTIs and kidney stones presents today for further evaluation of presumed recurrent UTIs and dysuria with urinary frequency.   She reports having three episodes of positive dipstick tests last year, with one instance in May showing leukocytes but a negative urine culture. She has had two documented, culture-proven Proteus infections last year.   Her urinalysis today has leukocytes, nitrate negative, no RBC, many epithelial cells consistent with a contaminated sample.   She describes her symptoms as burning during urination and increased frequency, which prompts her to seek medical attention.   She has undergone three prolapse surgeries, the first with a hysterectomy and the subsequent two involving vaginal mesh. There is concern about the vaginal mesh, noting discomfort during intercourse.   She has a history of colon cancer and uses Always Discreet pants due to diarrhea concerns following partial colon resection.   She has been on Gabapentin  for restless legs, which she believes contributes to weight gain.   She also takes Wellbutrin  for depression following the loss of family members.   She has not used topical estrogen cream but was on hormone replacement therapy with a patch until her sixties.  PMH: Past Medical History:  Diagnosis Date   Allergy Na   Anginal pain (HCC)    Arthritis NA   Cancer (HCC) 2017   Cataract 2005 & 2007   CHF (congestive heart failure) (HCC)    Coronary artery  disease    Dyspnea    GERD (gastroesophageal reflux disease)    Hypertension    Hypothyroidism    Iron  deficiency anemia 08/18/2022   Myocardial infarction (HCC) 12/22/20   Sleep apnea 2023    Surgical History: Past Surgical History:  Procedure Laterality Date   ABDOMINAL HYSTERECTOMY  1991   APPENDECTOMY  2016   BLADDER SURGERY     2011   BUNIONECTOMY     CATARACT EXTRACTION Bilateral    CHOLECYSTECTOMY  2016   COLON SURGERY  2016 & 2017   CORONARY/GRAFT ACUTE MI REVASCULARIZATION N/A 12/22/2020   Procedure: Coronary/Graft Acute MI Revascularization;  Surgeon: Antonette Batters, MD;  Location: [facility];  Service: Cardiovascular;  Laterality: N/A;   HERNIA REPAIR  2021   LEFT HEART CATH AND CORONARY ANGIOGRAPHY N/A 12/22/2020   Procedure: LEFT HEART CATH AND CORONARY ANGIOGRAPHY;  Surgeon: Antonette Batters, MD;  Location: [facility];  Service: Cardiovascular;  Laterality: N/A;   LEFT HEART CATH AND CORONARY ANGIOGRAPHY N/A 07/21/2021   Procedure: LEFT HEART CATH AND CORONARY ANGIOGRAPHY with possible Intervention;  Surgeon: Antonette Batters, MD;  Location: [facility];  Service: Cardiovascular;  Laterality: N/A;    Home Medications:  Allergies as of 05/31/2024       Reactions   Cyclobenzaprine Hives   Morphine And Codeine Anxiety   Hallucinations, nausea, vomiting, doesn't help my pain at all   Tetracyclines & Related Nausea And Vomiting   Benzonatate  Rash        Medication List  Accurate as of May 31, 2024  2:50 PM. If you have any questions, ask your nurse or doctor.          STOP taking these medications    phenazopyridine  100 MG tablet Commonly known as: Pyridium        TAKE these medications    aspirin  81 MG chewable tablet Chew 81 mg by mouth daily.   atorvastatin  10 MG tablet Commonly known as: LIPITOR  Take 1 tablet (10 mg total) by mouth daily.   buPROPion  300 MG 24 hr tablet Commonly known as:  WELLBUTRIN  XL TAKE 1 TABLET BY MOUTH EVERY DAY   Cholecalciferol  125 MCG (5000 UT) Tabs Take 5,000 Units by mouth daily.   cyanocobalamin  1000 MCG tablet Commonly known as: VITAMIN B12 Take 1 tablet (1,000 mcg total) by mouth daily.   estradiol 0.1 MG/GM vaginal cream Commonly known as: ESTRACE Estrogen Cream Instruction Discard applicator Apply pea sized amount to tip of finger to urethra before bed. Wash hands well after application. Use Monday, Wednesday and Friday   ezetimibe  10 MG tablet Commonly known as: ZETIA  TAKE 1 TABLET BY MOUTH EVERY DAY   famotidine  20 MG tablet Commonly known as: PEPCID  Take 20 mg by mouth 2 (two) times daily.   furosemide  20 MG tablet Commonly known as: LASIX  TAKE 1/2 TABLET BY MOUTH DAILY   gabapentin  400 MG capsule Commonly known as: NEURONTIN  Take 1 capsule (400 mg total) by mouth at bedtime.   gabapentin  100 MG capsule Commonly known as: Neurontin  Take 2 capsules (200 mg total) by mouth daily.   hydrochlorothiazide  25 MG tablet Commonly known as: HYDRODIURIL  Take 1 tablet (25 mg total) by mouth daily.   Klor-Con  M20 20 MEQ tablet Generic drug: potassium chloride  SA Take 1 tablet (20 mEq total) by mouth daily.   levothyroxine  75 MCG tablet Commonly known as: SYNTHROID  Take 1 tablet (75 mcg total) by mouth daily.   loperamide  2 MG tablet Commonly known as: IMODIUM  A-D Take 2 mg by mouth 4 (four) times daily as needed for diarrhea or loose stools.   metoprolol  succinate 25 MG 24 hr tablet Commonly known as: TOPROL -XL Take 0.5 tablets (12.5 mg total) by mouth daily.   nitroGLYCERIN 0.4 MG SL tablet Commonly known as: NITROSTAT Place 0.4 mg under the tongue every 2 (two) hours as needed for chest pain.   PreserVision AREDS Tabs Take 1 tablet by mouth daily.        Allergies:  Allergies  Allergen Reactions   Cyclobenzaprine Hives   Morphine And Codeine Anxiety    Hallucinations, nausea, vomiting, doesn't help my pain  at all   Tetracyclines & Related Nausea And Vomiting   Benzonatate  Rash    Family History: Family History  Problem Relation Age of Onset   Heart disease Mother    Hypertension Mother    Obesity Mother    Vision loss Mother    Alcohol abuse Father    Cancer Brother    Diabetes Sister    Varicose Veins Daughter     Social History:  reports that she has never smoked. She has never used smokeless tobacco. She reports that she does not currently use alcohol. She reports that she does not use drugs.   Physical Exam: BP (!) 155/79   Pulse 75   Ht 5' 4 (1.626 m)   Wt 177 lb (80.3 kg)   BMI 30.38 kg/m   Constitutional:  Alert and oriented, No acute distress. HEENT: [place] AT, moist mucus membranes.  Trachea midline, no masses. Neurologic: Grossly intact, no focal deficits, moving all 4 extremities. Psychiatric: Normal mood and affect.   Assessment & Plan:    1. Recurrent UTIs and Dysuria - She has a history of recurrent UTIs, with two culture-proven Proteus infections last year. However, many of her urine cultures have been negative, suggesting possible over-treatment. The presence of leukocytes without nitrites and many epithelial cells in her urine suggests contamination rather than true infection. - Initiate topical estrogen cream to address potential atrophic changes contributing to symptoms. Apply daily for two weeks, then reduce to three times weekly. Consider cranberry tablets, probiotics, and D-mannose supplements to prevent UTIs. Education provided on proper urine sample collection to reduce contamination.  - Follow up if symptoms persist or worsen.  2. History of Kidney Stones - She has a history of kidney stones, which could contribute to recurrent UTIs, especially with Proteus infections. - Order a KUB to rule out current kidney stones.  - Follow up on results and consider further urological evaluation if stones are present.  3. Vaginal Mesh Concerns - She has  expressed concerns about the vaginal mesh used in her previous prolapse surgeries, particularly regarding discomfort during intercourse. - Monitor symptoms. If persistent discomfort or suspicion of mesh complications arises, consider cystoscopy to evaluate mesh placement and integrity.  4. Weight Gain and Restless Legs Syndrome - She reports weight gain, potentially related to gabapentin  use for restless legs syndrome. - Encourage physical activity and consider referral to a trainer for a tailored exercise program.  - Discuss potential medication adjustments if weight gain continues to be a concern.  5. Psychological Well-being - She is experiencing emotional distress due to recent family losses and is currently on Wellbutrin . - Continue current psychiatric management. Offer support and consider referral to counseling services if needed.   Return if symptoms worsen or fail to improve.  I have reviewed the above documentation for accuracy and completeness, and I agree with the above.   Karen Gimenez, MD    [facility] [address] [place], [place] [address] [phone]

## 2024-05-31 NOTE — Patient Instructions (Signed)
For UTI prevention take cranberry tablets, probiotic, D-Mannose daily.  

## 2024-06-02 ENCOUNTER — Encounter: Payer: Self-pay | Admitting: Urology

## 2024-06-02 ENCOUNTER — Encounter: Payer: Self-pay | Admitting: Internal Medicine

## 2024-06-05 ENCOUNTER — Other Ambulatory Visit: Payer: Self-pay

## 2024-06-05 ENCOUNTER — Encounter: Payer: Self-pay | Admitting: Internal Medicine

## 2024-06-05 ENCOUNTER — Ambulatory Visit (INDEPENDENT_AMBULATORY_CARE_PROVIDER_SITE_OTHER): Payer: Self-pay | Admitting: Internal Medicine

## 2024-06-05 VITALS — BP 128/72 | HR 84 | Temp 98.1°F | Resp 16 | Ht 64.0 in | Wt 175.3 lb

## 2024-06-05 DIAGNOSIS — E876 Hypokalemia: Secondary | ICD-10-CM | POA: Diagnosis not present

## 2024-06-05 DIAGNOSIS — I5032 Chronic diastolic (congestive) heart failure: Secondary | ICD-10-CM | POA: Diagnosis not present

## 2024-06-05 DIAGNOSIS — I2510 Atherosclerotic heart disease of native coronary artery without angina pectoris: Secondary | ICD-10-CM | POA: Diagnosis not present

## 2024-06-05 DIAGNOSIS — E039 Hypothyroidism, unspecified: Secondary | ICD-10-CM

## 2024-06-05 DIAGNOSIS — N1832 Chronic kidney disease, stage 3b: Secondary | ICD-10-CM | POA: Diagnosis not present

## 2024-06-05 DIAGNOSIS — I10 Essential (primary) hypertension: Secondary | ICD-10-CM | POA: Diagnosis not present

## 2024-06-05 DIAGNOSIS — Z1231 Encounter for screening mammogram for malignant neoplasm of breast: Secondary | ICD-10-CM

## 2024-06-05 DIAGNOSIS — F330 Major depressive disorder, recurrent, mild: Secondary | ICD-10-CM | POA: Diagnosis not present

## 2024-06-05 MED ORDER — HYDROCHLOROTHIAZIDE 25 MG PO TABS: 25.0000 mg | ORAL_TABLET | Freq: Every day | ORAL | 1 refills | Status: DC

## 2024-06-05 MED ORDER — FUROSEMIDE 20 MG PO TABS: 10.0000 mg | ORAL_TABLET | Freq: Every day | ORAL | 1 refills | Status: DC

## 2024-06-05 MED ORDER — KLOR-CON M20 20 MEQ PO TBCR: 20.0000 meq | EXTENDED_RELEASE_TABLET | Freq: Every day | ORAL | 1 refills | Status: DC

## 2024-06-05 MED ORDER — ATORVASTATIN CALCIUM 10 MG PO TABS: 10.0000 mg | ORAL_TABLET | Freq: Every day | ORAL | 1 refills | Status: DC

## 2024-06-05 MED ORDER — LEVOTHYROXINE SODIUM 75 MCG PO TABS: 75.0000 ug | ORAL_TABLET | Freq: Every day | ORAL | 1 refills | Status: DC

## 2024-06-05 NOTE — Progress Notes (Signed)
 Established Patient Office Visit  Subjective   Patient ID: Karen Madden, female    DOB: 11/30/1946  Age: 78 y.o. MRN: [id number]  Chief Complaint  Patient presents with   Medical Management of Chronic Issues    6 month recheck    HPI  Patient is here for follow up on chronic medical conditions.    Discussed the use of AI scribe software for clinical note transcription with the patient, who gave verbal consent to proceed.  History of Present Illness Karen Madden is a 78 year old female who presents for a six-month follow-up visit.  She recently saw a urologist who recommended estradiol  cream for the urethra, but she has not started it. An x-ray was done to check for a possible kidney stone on the right side, and a urine sample showed inflammatory cells without bacteria. Interstitial cystitis is considered, but she lacks urge symptoms. She has undergone three prolapse surgeries, two with vaginal mesh.  She is on Wellbutrin  and wishes to continue it, especially after the recent deaths of her brother and sister. She started Wellbutrin  after her sister's death and tolerates it well. Her medications include Lipitor , thyroid  medication, furosemide  (Lasix ), and gabapentin . She takes 10 mg of Lasix  daily, split from a 20 mg tablet, and takes prescription potassium inconsistently, crushing it and mixing with applesauce. Her last kidney function and potassium levels were checked in December.  She notes a red patch on her leg that appears when standing after a shower, raising concerns about blood clots due to a previous knee issue.  She is due for a mammogram, having received a notification. Her daughter had breast cancer last year, which was treated successfully. She recalls her last mammogram at a local hospital or breast cancer screening facility.   Hx of STEMI/HTN/OSA/CHFpEF: -Medications: Lasix  10 mg, HCTZ 25 mg, KCl 20 meQ, Metoprolol  12.5 mg, nitroglycerin PRN -Patient is compliant  with above medications and reports no side effects. -Checking BP at home (average): 120-130/80 -Denies any SOB, CP, vision changes, LE edema or symptoms of hypotension -Most recent echo 6/22 EF >55% -Hx of STEMI 12/22/20 - 1 stent in LAD, following with Cardiology -Most recent cath 08/09/21 showing widely patent stent in LAD, EF 60%  CAD/HLD: -Medications: aspirin  81, Lipitor  10 mg, Zetia  10 mg  -Patient is compliant with above medications and reports no side effects.  -Last lipid panel:Lipid Panel     Component Value Date/Time   CHOL 117 12/06/2023 1425   TRIG 176 (H) 12/06/2023 1425   HDL 52 12/06/2023 1425   CHOLHDL 2.3 12/06/2023 1425   VLDL 29 12/22/2020 1521   LDLCALC 40 12/06/2023 1425    Hypothyroidism: -Medications: Levothyroxine  75 mcg  -Patient is compliant with the above medication (s) at the above dose and reports no medication side effects.  -Denies weight changes, cold./heat intolerance, skin changes, anxiety/palpitations  -Last TSH: 1.78 12/24  GERD: -Currently using Pepcid  20 mg over the counter PRN  Hx of Colon Cancer:  -First diagnosed in 2016 - had a large polyp on cecum. Then hgb was low, passed out. Had a second tumor removed in 2017. Had a partial colectomy.  -Following with GI   MDD: -Mood status: stable -Current treatment: Wellbutrin  300 mg XL -Satisfied with current treatment?: yes -Symptom severity: mild  -Duration of current treatment : years -Side effects: no Medication compliance: excellent compliance     06/05/2024    2:20 PM 12/06/2023    1:40 PM 11/25/2023  10:26 AM 11/11/2023   12:55 PM 09/06/2023    1:26 PM  Depression screen PHQ 2/9  Decreased Interest 0 0 0 0 0  Down, Depressed, Hopeless 0 0 0 0 0  PHQ - 2 Score 0 0 0 0 0  Altered sleeping    0 0  Tired, decreased energy    0 0  Change in appetite    0 0  Feeling bad or failure about yourself     0 0  Trouble concentrating    0 0  Moving slowly or fidgety/restless    0 0   Suicidal thoughts    0 0  PHQ-9 Score    0 0  Difficult doing work/chores    Not difficult at all     Health Maintenance: -Blood work due -Mammogram 6/24 Birads-1; due -Discussed Prevnar 20 but will administered it at follow up  Patient Active Problem List   Diagnosis Date Noted   Restless leg 12/06/2023   Screening mammogram, encounter for 05/31/2023   B12 deficiency 10/13/2022   Cataracts, bilateral 09/01/2022   Iron  deficiency anemia 08/18/2022   Vertigo 02/16/2022   CAD (coronary artery disease) 12/23/2020   HTN (hypertension) 12/23/2020   COVID-19 virus infection 12/23/2020   Pneumonia due to COVID-19 virus 12/23/2020   STEMI involving left anterior descending coronary artery (HCC) 12/22/2020   STEMI (ST elevation myocardial infarction) (HCC) 12/22/2020   Depression 08/02/2020   Hernia of anterior abdominal wall 10/06/2019   Disorder of tendon of right biceps 10/05/2019   Advance care planning 10/03/2019   Right lateral epicondylitis 06/09/2019   Bilateral carotid artery disease (HCC) 08/23/2018   Hypercholesteremia 04/10/2018   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/2018   Recurrent major depressive disorder, in partial remission (HCC) 10/22/2017   Abdominal aortic atherosclerosis (HCC) 09/04/2017   Vitamin D  deficiency 05/28/2017   Dermatofibroma 05/10/2017   History of colon cancer 10/13/2016   Chronic insomnia 06/02/2016   History of basal cell cancer 06/02/2016   Chronic low back pain without sciatica 06/02/2016   Hearing loss 08/08/2015   Overweight (BMI 25.0-29.9) 03/15/2014   Osteoarthritis 02/20/2014   Hypothyroidism 02/20/2014   Muscle weakness 09/13/2013   Leg pain 09/13/2013   Past Medical History:  Diagnosis Date   Allergy Na   Anginal pain (HCC)    Arthritis NA   Cancer (HCC) 2017   Cataract 2005 & 2007   CHF (congestive heart failure) (HCC)    Coronary artery disease    Dyspnea    GERD (gastroesophageal reflux disease)    Hypertension     Hypothyroidism    Iron  deficiency anemia 08/18/2022   Myocardial infarction (HCC) 12/22/20   Sleep apnea 2023   Past Surgical History:  Procedure Laterality Date   ABDOMINAL HYSTERECTOMY  1991   APPENDECTOMY  2016   BLADDER SURGERY     2011   BUNIONECTOMY     CATARACT EXTRACTION Bilateral    CHOLECYSTECTOMY  2016   COLON SURGERY  2016 & 2017   CORONARY/GRAFT ACUTE MI REVASCULARIZATION N/A 12/22/2020   Procedure: Coronary/Graft Acute MI Revascularization;  Surgeon: Antonette Batters, MD;  Location: [facility];  Service: Cardiovascular;  Laterality: N/A;   HERNIA REPAIR  2021   LEFT HEART CATH AND CORONARY ANGIOGRAPHY N/A 12/22/2020   Procedure: LEFT HEART CATH AND CORONARY ANGIOGRAPHY;  Surgeon: Antonette Batters, MD;  Location: [facility];  Service: Cardiovascular;  Laterality: N/A;   LEFT HEART CATH AND CORONARY ANGIOGRAPHY  N/A 07/21/2021   Procedure: LEFT HEART CATH AND CORONARY ANGIOGRAPHY with possible Intervention;  Surgeon: Antonette Batters, MD;  Location: [facility];  Service: Cardiovascular;  Laterality: N/A;   Social History   Tobacco Use   Smoking status: Never   Smokeless tobacco: Never  Vaping Use   Vaping status: Never Used  Substance Use Topics   Alcohol use: Not Currently   Drug use: Never   Social History   Socioeconomic History   Marital status: Married    Spouse name: Not on file   Number of children: Not on file   Years of education: Not on file   Highest education level: Bachelor's degree (e.g., BA, AB, BS)  Occupational History   Not on file  Tobacco Use   Smoking status: Never   Smokeless tobacco: Never  Vaping Use   Vaping status: Never Used  Substance and Sexual Activity   Alcohol use: Not Currently   Drug use: Never   Sexual activity: Yes  Other Topics Concern   Not on file  Social History Narrative   Not on file   Social Drivers of Health   Financial Resource Strain: Low Risk  (06/01/2024)   Overall  Financial Resource Strain (CARDIA)    Difficulty of Paying Living Expenses: Not hard at all  Food Insecurity: No Food Insecurity (06/01/2024)   Hunger Vital Sign    Worried About Running Out of Food in the Last Year: Never true    Ran Out of Food in the Last Year: Never true  Transportation Needs: No Transportation Needs (06/01/2024)   PRAPARE - Administrator, Civil Service (Medical): No    Lack of Transportation (Non-Medical): No  Physical Activity: Inactive (06/01/2024)   Exercise Vital Sign    Days of Exercise per Week: 0 days    Minutes of Exercise per Session: Not on file  Stress: Stress Concern Present (06/01/2024)   [employer] of Occupational Health - Occupational Stress Questionnaire    Feeling of Stress: To some extent  Social Connections: Socially Integrated (06/01/2024)   Social Connection and Isolation Panel    Frequency of Communication with Friends and Family: More than three times a week    Frequency of Social Gatherings with Friends and Family: More than three times a week    Attends Religious Services: More than 4 times per year    Active Member of [employer] or Organizations: Yes    Attends Engineer, structural: More than 4 times per year    Marital Status: Married  Catering manager Violence: Not At Risk (04/29/2023)   Humiliation, Afraid, Rape, and Kick questionnaire    Fear of Current or Ex-Partner: No    Emotionally Abused: No    Physically Abused: No    Sexually Abused: No   Family Status  Relation Name Status   Mother Perla Bradford (Not Specified)   Father STERLIN (Not Specified)   Brother Ron (Not Specified)   Sister Orelia Binet (Not Specified)   Daughter Trevor Fudge (Not Specified)  No partnership data on file   Family History  Problem Relation Age of Onset   Heart disease Mother    Hypertension Mother    Obesity Mother    Vision loss Mother    Alcohol abuse Father    Cancer Brother    Diabetes Sister    Varicose Veins Daughter     Allergies  Allergen Reactions   Cyclobenzaprine Hives   Morphine And Codeine Anxiety  Hallucinations, nausea, vomiting, doesn't help my pain at all   Tetracyclines & Related Nausea And Vomiting   Benzonatate  Rash      Review of Systems  All other systems reviewed and are negative.     Objective:     BP 128/72 (Cuff Size: Large)   Pulse 84   Temp 98.1 F (36.7 C) (Oral)   Resp 16   Ht 5' 4 (1.626 m)   Wt 175 lb 4.8 oz (79.5 kg)   SpO2 94%   BMI 30.09 kg/m  BP Readings from Last 3 Encounters:  06/05/24 128/72  05/31/24 (!) 155/79  04/27/24 134/74   Wt Readings from Last 3 Encounters:  06/05/24 175 lb 4.8 oz (79.5 kg)  05/31/24 177 lb (80.3 kg)  04/27/24 173 lb 3.2 oz (78.6 kg)      Physical Exam Constitutional:      Appearance: Normal appearance.  HENT:     Head: Normocephalic and atraumatic.   Eyes:     Conjunctiva/sclera: Conjunctivae normal.    Cardiovascular:     Rate and Rhythm: Normal rate and regular rhythm.  Pulmonary:     Effort: Pulmonary effort is normal.     Breath sounds: Normal breath sounds.   Skin:    General: Skin is warm and dry.   Neurological:     General: No focal deficit present.     Mental Status: She is alert. Mental status is at baseline.   Psychiatric:        Mood and Affect: Mood normal.        Behavior: Behavior normal.      No results found for any visits on 06/05/24.  Last CBC Lab Results  Component Value Date   WBC 11.0 (H) 12/06/2023   HGB 13.6 12/06/2023   HCT 41.1 12/06/2023   MCV 88.0 12/06/2023   MCH 29.1 12/06/2023   RDW 13.0 12/06/2023   PLT 408 (H) 12/06/2023   Last metabolic panel Lab Results  Component Value Date   GLUCOSE 90 12/06/2023   NA 135 12/06/2023   K 3.3 (L) 12/06/2023   CL 92 (L) 12/06/2023   CO2 30 12/06/2023   BUN 15 12/06/2023   CREATININE 1.06 (H) 12/06/2023   EGFR 54 (L) 12/06/2023   CALCIUM  9.5 12/06/2023   PROT 6.7 12/06/2023   ALBUMIN 3.3 (L) 12/26/2020    BILITOT 0.4 12/06/2023   ALKPHOS 58 12/26/2020   AST 18 12/06/2023   ALT 15 12/06/2023   ANIONGAP 12 12/26/2020   Last lipids Lab Results  Component Value Date   CHOL 117 12/06/2023   HDL 52 12/06/2023   LDLCALC 40 12/06/2023   TRIG 176 (H) 12/06/2023   CHOLHDL 2.3 12/06/2023   Last hemoglobin A1c Lab Results  Component Value Date   HGBA1C 5.7 (H) 12/22/2020   Last thyroid  functions Lab Results  Component Value Date   TSH 1.78 12/06/2023   Last vitamin D  No results found for: 25OHVITD2, 25OHVITD3, VD25OH Last vitamin B12 and Folate Lab Results  Component Value Date   VITAMINB12 1,110 (H) 05/31/2023   FOLATE 12.5 09/01/2022      The ASCVD Risk score (Arnett DK, et al., 2019) failed to calculate for the following reasons:   Risk score cannot be calculated because patient has a medical history suggesting prior/existing ASCVD    Assessment & Plan:   Assessment & Plan  Hypertension/HF Hypertension managed with Lasix  and potassium. Potassium monitoring necessary due to Lasix . - Refill Lasix  prescription. -  Check potassium levels today. - Encourage consistent potassium supplementation.  CAD/Hyperlipidemia Hyperlipidemia managed with Lipitor . - Refill Lipitor  prescription.  Depression Depression managed with Wellbutrin . She reports stability and no adverse effects. - Continue Wellbutrin  as prescribed.  Hypothyroidism Symptoms stable, refill Levothyroxine .   CKD3b -Recheck BMP.  General Health Maintenance Due for mammogram and pneumonia vaccination. Mammogram important due to family history of breast cancer. Pneumonia vaccination deferred to December. - Order mammogram. - Plan for pneumonia vaccination at next visit in December.  Follow-up Follow-up plans for ongoing management and monitoring discussed. - Schedule follow-up appointment in six months for comprehensive labs and pneumonia vaccination. - Call and schedule mammogram.  -  hydrochlorothiazide  (HYDRODIURIL ) 25 MG tablet; Take 1 tablet (25 mg total) by mouth daily.  Dispense: 90 tablet; Refill: 1 - furosemide  (LASIX ) 20 MG tablet; Take 0.5 tablets (10 mg total) by mouth daily.  Dispense: 45 tablet; Refill: 1 - Basic Metabolic Panel (BMET) - KLOR-CON  M20 20 MEQ tablet; Take 1 tablet (20 mEq total) by mouth daily.  Dispense: 90 tablet; Refill: 1 - atorvastatin  (LIPITOR ) 10 MG tablet; Take 1 tablet (10 mg total) by mouth daily.  Dispense: 90 tablet; Refill: 1 - levothyroxine  (SYNTHROID ) 75 MCG tablet; Take 1 tablet (75 mcg total) by mouth daily.  Dispense: 90 tablet; Refill: 1 - MM 3D SCREENING MAMMOGRAM BILATERAL BREAST; Future   Return in about 6 months (around 12/05/2024).    Rockney Cid, DO

## 2024-06-06 ENCOUNTER — Ambulatory Visit: Payer: Self-pay | Admitting: Internal Medicine

## 2024-06-06 LAB — BASIC METABOLIC PANEL WITH GFR
BUN/Creatinine Ratio: 16 (calc) (ref 6–22)
BUN: 19 mg/dL (ref 7–25)
CO2: 29 mmol/L (ref 20–32)
Calcium: 9.8 mg/dL (ref 8.6–10.4)
Chloride: 98 mmol/L (ref 98–110)
Creat: 1.17 mg/dL — ABNORMAL HIGH (ref 0.60–1.00)
Glucose, Bld: 77 mg/dL (ref 65–99)
Potassium: 3.7 mmol/L (ref 3.5–5.3)
Sodium: 141 mmol/L (ref 135–146)
eGFR: 48 mL/min/{1.73_m2} — ABNORMAL LOW (ref >=60)

## 2024-06-16 ENCOUNTER — Ambulatory Visit: Payer: Self-pay | Admitting: Urology

## 2024-06-24 ENCOUNTER — Other Ambulatory Visit: Payer: Self-pay | Admitting: Internal Medicine

## 2024-06-24 DIAGNOSIS — G2581 Restless legs syndrome: Secondary | ICD-10-CM

## 2024-06-27 NOTE — Telephone Encounter (Signed)
 Requested medication (s) are due for refill today: yes   Requested medication (s) are on the active medication list: yes   Last refill:  02/21/24 #60 3 refills  Future visit scheduled: yes in 5 months  Notes to clinic:  protocol failed last labs 06/05/24. Do you want to refill Rx?     Requested Prescriptions  Pending Prescriptions Disp Refills   gabapentin  (NEURONTIN ) 100 MG capsule [Pharmacy Med Name: GABAPENTIN  100 MG CAPSULE] 60 capsule 3    Sig: TAKE 2 CAPSULES BY MOUTH DAILY.     Neurology: Anticonvulsants - gabapentin  Failed - 06/27/2024 12:07 PM      Failed - Cr in normal range and within 360 days    Creat  Date Value Ref Range Status  06/05/2024 1.17 (H) 0.60 - 1.00 mg/dL Final         Passed - Completed PHQ-2 or PHQ-9 in the last 360 days      Passed - Valid encounter within last 12 months    Recent Outpatient Visits           3 weeks ago Primary hypertension   [facility] Bernardo Fend, DO   2 months ago Dysuria   [facility] Gareth Mliss FALCON, FNP   2 months ago Bilateral impacted cerumen   [facility] Bernardo Fend, DO       Future Appointments             In 5 months Bernardo Fend, DO [facility], [facility]

## 2024-07-31 ENCOUNTER — Ambulatory Visit
Admission: RE | Admit: 2024-07-31 | Discharge: 2024-07-31 | Disposition: A | Discharge status: Home / Self Care | Source: Ambulatory Visit | Attending: Internal Medicine | Admitting: Internal Medicine

## 2024-07-31 ENCOUNTER — Encounter: Payer: Self-pay | Admitting: Family Medicine

## 2024-07-31 ENCOUNTER — Encounter: Payer: Self-pay | Admitting: Internal Medicine

## 2024-07-31 ENCOUNTER — Ambulatory Visit
Admission: RE | Admit: 2024-07-31 | Discharge: 2024-07-31 | Disposition: A | Discharge status: Home / Self Care | Source: Ambulatory Visit | Attending: Internal Medicine

## 2024-07-31 ENCOUNTER — Other Ambulatory Visit: Payer: Self-pay

## 2024-07-31 ENCOUNTER — Ambulatory Visit (INDEPENDENT_AMBULATORY_CARE_PROVIDER_SITE_OTHER): Admitting: Internal Medicine

## 2024-07-31 VITALS — BP 142/86 | HR 83 | Temp 98.0°F | Resp 16 | Ht 64.0 in | Wt 182.1 lb

## 2024-07-31 DIAGNOSIS — M79662 Pain in left lower leg: Secondary | ICD-10-CM | POA: Diagnosis not present

## 2024-07-31 DIAGNOSIS — R0602 Shortness of breath: Secondary | ICD-10-CM | POA: Diagnosis not present

## 2024-07-31 DIAGNOSIS — M79605 Pain in left leg: Secondary | ICD-10-CM | POA: Diagnosis not present

## 2024-07-31 DIAGNOSIS — I2510 Atherosclerotic heart disease of native coronary artery without angina pectoris: Secondary | ICD-10-CM | POA: Diagnosis not present

## 2024-07-31 DIAGNOSIS — M7989 Other specified soft tissue disorders: Secondary | ICD-10-CM | POA: Insufficient documentation

## 2024-07-31 DIAGNOSIS — K769 Liver disease, unspecified: Secondary | ICD-10-CM | POA: Diagnosis not present

## 2024-07-31 MED ORDER — IOHEXOL 350 MG/ML SOLN
75.0000 mL | Freq: Once | INTRAVENOUS | Status: AC | PRN
  Administered 2024-07-31 (×2): 75 mL via INTRAVENOUS

## 2024-07-31 NOTE — Progress Notes (Signed)
 Patient ID: Karen Madden, female    DOB: 08/20/1946, 78 y.o.   MRN: [id number]  PCP: Bernardo Fend, DO  Chief Complaint  Patient presents with   Leg Pain    Left leg for 1 week    Subjective:   Karen Madden is a 78 y.o. female, presents to clinic with CC of the following: left lower leg swelling and pain and new SOB.   Discussed the use of AI scribe software for clinical note transcription with the patient, who gave verbal consent to proceed.  History of Present Illness Karen Madden is a 78 year old female who presents with left leg pain and swelling.  She has experienced significant pain and swelling in her left leg for the past week. This differs from the swelling after a fall on the same knee two years ago, which resulted in bruising. There is no recent immobility, long car rides, or flights. [place] activities.  She takes Lasix  10 mg daily and aspirin  as her only blood thinner. She avoids anti-inflammatory medications like ibuprofen due to previous issues with kidney function and hemoglobin levels.  She has new onset shortness of breath, particularly at night or upon waking, which started this summer. It feels like she has exercised, with several episodes over the past few weeks.    Patient Active Problem List   Diagnosis Date Noted   Stage 3b chronic kidney disease (HCC) 06/05/2024   Restless leg 12/06/2023   Screening mammogram, encounter for 05/31/2023   B12 deficiency 10/13/2022   Cataracts, bilateral 09/01/2022   Iron  deficiency anemia 08/18/2022   Vertigo 02/16/2022   CAD (coronary artery disease) 12/23/2020   HTN (hypertension) 12/23/2020   COVID-19 virus infection 12/23/2020   Pneumonia due to COVID-19 virus 12/23/2020   STEMI involving left anterior descending coronary artery (HCC) 12/22/2020   STEMI (ST elevation myocardial infarction) (HCC) 12/22/2020   Depression 08/02/2020   Hernia of anterior  abdominal wall 10/06/2019   Disorder of tendon of right biceps 10/05/2019   Advance care planning 10/03/2019   Right lateral epicondylitis 06/09/2019   Bilateral carotid artery disease (HCC) 08/23/2018   Hypercholesteremia 04/10/2018   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/2018   Recurrent major depressive disorder, in partial remission (HCC) 10/22/2017   Abdominal aortic atherosclerosis (HCC) 09/04/2017   Vitamin D  deficiency 05/28/2017   Dermatofibroma 05/10/2017   History of colon cancer 10/13/2016   Chronic insomnia 06/02/2016   History of basal cell cancer 06/02/2016   Chronic low back pain without sciatica 06/02/2016   Hearing loss 08/08/2015   Overweight (BMI 25.0-29.9) 03/15/2014   Osteoarthritis 02/20/2014   Hypothyroidism 02/20/2014   Muscle weakness 09/13/2013   Leg pain 09/13/2013      Current Outpatient Medications:    aspirin  81 MG chewable tablet, Chew 81 mg by mouth daily., Disp: , Rfl:    atorvastatin  (LIPITOR ) 10 MG tablet, Take 1 tablet (10 mg total) by mouth daily., Disp: 90 tablet, Rfl: 1   buPROPion  (WELLBUTRIN  XL) 300 MG 24 hr tablet, TAKE 1 TABLET BY MOUTH EVERY DAY, Disp: 90 tablet, Rfl: 1   cyanocobalamin  (VITAMIN B12) 1000 MCG tablet, Take 1 tablet (1,000 mcg total) by mouth daily., Disp: 90 tablet, Rfl: 1   ezetimibe  (ZETIA ) 10 MG tablet, TAKE 1 TABLET BY MOUTH EVERY DAY, Disp: 90 tablet, Rfl: 1   famotidine  (PEPCID ) 20 MG tablet, Take 20 mg by mouth 2 (two) times daily.,  Disp: , Rfl:    furosemide  (LASIX ) 20 MG tablet, Take 0.5 tablets (10 mg total) by mouth daily., Disp: 45 tablet, Rfl: 1   gabapentin  (NEURONTIN ) 100 MG capsule, TAKE 2 CAPSULES BY MOUTH DAILY., Disp: 60 capsule, Rfl: 3   gabapentin  (NEURONTIN ) 400 MG capsule, Take 1 capsule (400 mg total) by mouth at bedtime., Disp: 90 capsule, Rfl: 3   hydrochlorothiazide  (HYDRODIURIL ) 25 MG tablet, Take 1 tablet (25 mg total) by mouth daily., Disp: 90 tablet, Rfl: 1   KLOR-CON  M20 20 MEQ tablet, Take 1 tablet  (20 mEq total) by mouth daily., Disp: 90 tablet, Rfl: 1   levothyroxine  (SYNTHROID ) 75 MCG tablet, Take 1 tablet (75 mcg total) by mouth daily., Disp: 90 tablet, Rfl: 1   loperamide  (IMODIUM  A-D) 2 MG tablet, Take 2 mg by mouth 4 (four) times daily as needed for diarrhea or loose stools., Disp: , Rfl:    metoprolol  succinate (TOPROL -XL) 25 MG 24 hr tablet, Take 0.5 tablets (12.5 mg total) by mouth daily., Disp: , Rfl:    Multiple Vitamins-Minerals (PRESERVISION AREDS) TABS, Take 1 tablet by mouth daily., Disp: , Rfl:    nitroGLYCERIN (NITROSTAT) 0.4 MG SL tablet, Place 0.4 mg under the tongue every 2 (two) hours as needed for chest pain., Disp: , Rfl:    Cholecalciferol  125 MCG (5000 UT) TABS, Take 5,000 Units by mouth daily., Disp: , Rfl:    estradiol  (ESTRACE ) 0.1 MG/GM vaginal cream, Estrogen Cream Instruction Discard applicator Apply pea sized amount to tip of finger to urethra before bed. Wash hands well after application. Use Monday, Wednesday and Friday (Patient not taking: Reported on 06/05/2024), Disp: 42.5 g, Rfl: 12   Semaglutide,0.25 or 0.5MG /DOS, 2 MG/3ML SOPN, Inject 0.5 mg into the skin. (Patient not taking: Reported on 06/05/2024), Disp: , Rfl:    Allergies  Allergen Reactions   Cyclobenzaprine Hives   Morphine And Codeine Anxiety    Hallucinations, nausea, vomiting, doesn't help my pain at all   Tetracyclines & Related Nausea And Vomiting   Benzonatate  Rash     Social History   Tobacco Use   Smoking status: Never   Smokeless tobacco: Never  Vaping Use   Vaping status: Never Used  Substance Use Topics   Alcohol use: Not Currently   Drug use: Never     Review of Systems  Respiratory:  Positive for chest tightness and shortness of breath. Negative for cough.   Cardiovascular:  Positive for leg swelling.       Objective:   Vitals:   07/31/24 1508  BP: (!) 142/86  Pulse: 83  Resp: 16  Temp: 98 F (36.7 C)  TempSrc: Oral  SpO2: 96%  Weight: 182 lb 1.6 oz  (82.6 kg)  Height: 5' 4 (1.626 m)    Body mass index is 31.26 kg/m.  Physical Exam Constitutional:      Appearance: Normal appearance.  HENT:     Head: Normocephalic and atraumatic.  Eyes:     Conjunctiva/sclera: Conjunctivae normal.  Cardiovascular:     Rate and Rhythm: Normal rate and regular rhythm.  Pulmonary:     Effort: Pulmonary effort is normal.     Breath sounds: Normal breath sounds. No wheezing, rhonchi or rales.  Musculoskeletal:        General: Tenderness present.     Right lower leg: No edema.     Left lower leg: Edema present.  Skin:    General: Skin is warm and dry.  Neurological:  General: No focal deficit present.     Mental Status: She is alert. Mental status is at baseline.  Psychiatric:        Mood and Affect: Mood normal.        Behavior: Behavior normal.      Results for orders placed or performed in visit on 06/05/24  Basic Metabolic Panel (BMET)   Collection Time: 06/05/24  3:01 PM  Result Value Ref Range   Glucose, Bld 77 65 - 99 mg/dL   BUN 19 7 - 25 mg/dL   Creat 8.82 (H) 9.39 - 1.00 mg/dL   eGFR 48 (L) > OR = 60 mL/min/1.4m2   BUN/Creatinine Ratio 16 6 - 22 (calc)   Sodium 141 135 - 146 mmol/L   Potassium 3.7 3.5 - 5.3 mmol/L   Chloride 98 98 - 110 mmol/L   CO2 29 20 - 32 mmol/L   Calcium  9.8 8.6 - 10.4 mg/dL       Assessment & Plan:   Assessment & Plan Left lower extremity pain and swelling, possible deep vein thrombosis Acute left lower extremity pain and swelling for one week, suspicious for DVT. No recent immobility or travel. Previous knee injury. Discussed DVT risks and potential anticoagulation with Eliquis, including interactions and contraindications. - Order ultrasound of left lower extremity to evaluate for DVT. - Consider starting Eliquis if DVT is confirmed, discuss cost implications. - Advise her to avoid anti-inflammatories like ibuprofen and Aleve if started on Eliquis.  Shortness of breath, possible  pulmonary embolism Recent onset of nocturnal shortness of breath, concerning for PE, especially with possible DVT. Discussed urgency and potential severity of PE. - Order CTA of the chest to evaluate for pulmonary embolism. - Advise her to go to the ER for immediate evaluation and testing due to potential severity of condition. - Discussed urgency of symptoms and need for immediate care if symptoms worsen.  - US  Venous Img Lower Unilateral Left (DVT) - CT Angio Chest Pulmonary Embolism (PE) W or WO Contrast; Future     Sharyle Fischer, DO 07/31/24 3:33 PM

## 2024-08-01 ENCOUNTER — Ambulatory Visit: Payer: Self-pay | Admitting: Internal Medicine

## 2024-08-11 DIAGNOSIS — I2102 ST elevation (STEMI) myocardial infarction involving left anterior descending coronary artery: Secondary | ICD-10-CM | POA: Diagnosis not present

## 2024-08-11 DIAGNOSIS — I2510 Atherosclerotic heart disease of native coronary artery without angina pectoris: Secondary | ICD-10-CM | POA: Diagnosis not present

## 2024-08-11 DIAGNOSIS — R5383 Other fatigue: Secondary | ICD-10-CM | POA: Diagnosis not present

## 2024-08-11 DIAGNOSIS — Z955 Presence of coronary angioplasty implant and graft: Secondary | ICD-10-CM | POA: Diagnosis not present

## 2024-08-11 DIAGNOSIS — E66811 Obesity, class 1: Secondary | ICD-10-CM | POA: Diagnosis not present

## 2024-08-11 DIAGNOSIS — I255 Ischemic cardiomyopathy: Secondary | ICD-10-CM | POA: Diagnosis not present

## 2024-08-11 DIAGNOSIS — K219 Gastro-esophageal reflux disease without esophagitis: Secondary | ICD-10-CM | POA: Diagnosis not present

## 2024-08-11 DIAGNOSIS — G4733 Obstructive sleep apnea (adult) (pediatric): Secondary | ICD-10-CM | POA: Diagnosis not present

## 2024-08-11 DIAGNOSIS — I5032 Chronic diastolic (congestive) heart failure: Secondary | ICD-10-CM | POA: Diagnosis not present

## 2024-08-11 DIAGNOSIS — R0789 Other chest pain: Secondary | ICD-10-CM | POA: Diagnosis not present

## 2024-08-11 DIAGNOSIS — E782 Mixed hyperlipidemia: Secondary | ICD-10-CM | POA: Diagnosis not present

## 2024-08-11 DIAGNOSIS — I10 Essential (primary) hypertension: Secondary | ICD-10-CM | POA: Diagnosis not present

## 2024-08-13 DIAGNOSIS — M1712 Unilateral primary osteoarthritis, left knee: Secondary | ICD-10-CM | POA: Diagnosis not present

## 2024-08-17 DIAGNOSIS — M1712 Unilateral primary osteoarthritis, left knee: Secondary | ICD-10-CM | POA: Diagnosis not present

## 2024-08-22 DIAGNOSIS — R0789 Other chest pain: Secondary | ICD-10-CM | POA: Diagnosis not present

## 2024-08-28 ENCOUNTER — Other Ambulatory Visit: Payer: Self-pay | Admitting: Internal Medicine

## 2024-08-28 DIAGNOSIS — F330 Major depressive disorder, recurrent, mild: Secondary | ICD-10-CM

## 2024-08-29 ENCOUNTER — Ambulatory Visit
Admission: RE | Admit: 2024-08-29 | Discharge: 2024-08-29 | Disposition: A | Discharge status: Home / Self Care | Source: Ambulatory Visit | Attending: Internal Medicine

## 2024-08-29 DIAGNOSIS — Z1231 Encounter for screening mammogram for malignant neoplasm of breast: Secondary | ICD-10-CM | POA: Insufficient documentation

## 2024-08-29 NOTE — Telephone Encounter (Signed)
 Labs in date.  Requested Prescriptions  Pending Prescriptions Disp Refills   buPROPion  (WELLBUTRIN  XL) 300 MG 24 hr tablet [Pharmacy Med Name: BUPROPION  HCL XL 300 MG TABLET] 90 tablet 1    Sig: TAKE 1 TABLET BY MOUTH EVERY DAY     Psychiatry: Antidepressants - bupropion  Failed - 08/29/2024 10:01 AM      Failed - Cr in normal range and within 360 days    Creat  Date Value Ref Range Status  06/05/2024 1.17 (H) 0.60 - 1.00 mg/dL Final         Failed - Last BP in normal range    BP Readings from Last 1 Encounters:  07/31/24 (!) 142/86         Passed - AST in normal range and within 360 days    AST  Date Value Ref Range Status  12/06/2023 18 10 - 35 U/L Final         Passed - ALT in normal range and within 360 days    ALT  Date Value Ref Range Status  12/06/2023 15 6 - 29 U/L Final         Passed - Completed PHQ-2 or PHQ-9 in the last 360 days      Passed - Valid encounter within last 6 months    Recent Outpatient Visits           4 weeks ago Swelling of left lower extremity   [facility] Bernardo Fend, DO   2 months ago Primary hypertension   [facility] Bernardo Fend, DO   4 months ago Dysuria   [facility] Gareth Mliss FALCON, FNP   4 months ago Bilateral impacted cerumen   [facility] Bernardo Fend, DO       Future Appointments             In 3 months Bernardo Fend, DO [facility] [place]

## 2024-08-30 ENCOUNTER — Other Ambulatory Visit: Payer: Self-pay | Admitting: Internal Medicine

## 2024-08-30 DIAGNOSIS — I429 Cardiomyopathy, unspecified: Secondary | ICD-10-CM

## 2024-09-05 ENCOUNTER — Encounter (HOSPITAL_COMMUNITY): Payer: Self-pay

## 2024-09-05 DIAGNOSIS — Z961 Presence of intraocular lens: Secondary | ICD-10-CM | POA: Diagnosis not present

## 2024-09-05 DIAGNOSIS — H353131 Nonexudative age-related macular degeneration, bilateral, early dry stage: Secondary | ICD-10-CM | POA: Diagnosis not present

## 2024-09-06 ENCOUNTER — Other Ambulatory Visit: Payer: Self-pay | Admitting: Internal Medicine

## 2024-09-06 ENCOUNTER — Ambulatory Visit
Admission: RE | Admit: 2024-09-06 | Discharge: 2024-09-06 | Disposition: A | Discharge status: Home / Self Care | Source: Ambulatory Visit | Attending: Internal Medicine | Admitting: Internal Medicine

## 2024-09-06 DIAGNOSIS — I429 Cardiomyopathy, unspecified: Secondary | ICD-10-CM | POA: Diagnosis not present

## 2024-09-06 MED ORDER — GADOBUTROL 1 MMOL/ML IV SOLN
10.0000 mL | Freq: Once | INTRAVENOUS | Status: AC | PRN
  Administered 2024-09-06: 11 mL via INTRAVENOUS

## 2024-09-15 DIAGNOSIS — I2102 ST elevation (STEMI) myocardial infarction involving left anterior descending coronary artery: Secondary | ICD-10-CM | POA: Diagnosis not present

## 2024-09-15 DIAGNOSIS — R5383 Other fatigue: Secondary | ICD-10-CM | POA: Diagnosis not present

## 2024-09-15 DIAGNOSIS — I2510 Atherosclerotic heart disease of native coronary artery without angina pectoris: Secondary | ICD-10-CM | POA: Diagnosis not present

## 2024-09-15 DIAGNOSIS — Z955 Presence of coronary angioplasty implant and graft: Secondary | ICD-10-CM | POA: Diagnosis not present

## 2024-09-15 DIAGNOSIS — I10 Essential (primary) hypertension: Secondary | ICD-10-CM | POA: Diagnosis not present

## 2024-09-15 DIAGNOSIS — R079 Chest pain, unspecified: Secondary | ICD-10-CM | POA: Diagnosis not present

## 2024-09-15 DIAGNOSIS — K219 Gastro-esophageal reflux disease without esophagitis: Secondary | ICD-10-CM | POA: Diagnosis not present

## 2024-09-15 DIAGNOSIS — I5032 Chronic diastolic (congestive) heart failure: Secondary | ICD-10-CM | POA: Diagnosis not present

## 2024-09-15 DIAGNOSIS — G4733 Obstructive sleep apnea (adult) (pediatric): Secondary | ICD-10-CM | POA: Diagnosis not present

## 2024-09-15 DIAGNOSIS — I255 Ischemic cardiomyopathy: Secondary | ICD-10-CM | POA: Diagnosis not present

## 2024-09-15 DIAGNOSIS — I429 Cardiomyopathy, unspecified: Secondary | ICD-10-CM | POA: Diagnosis not present

## 2024-09-15 DIAGNOSIS — E782 Mixed hyperlipidemia: Secondary | ICD-10-CM | POA: Diagnosis not present

## 2024-09-18 DIAGNOSIS — Z01818 Encounter for other preprocedural examination: Secondary | ICD-10-CM | POA: Diagnosis not present

## 2024-09-18 NOTE — Progress Notes (Addendum)
 Established Patient Visit   Chief Complaint: Chief Complaint  Patient presents with  . Chest Discomfort     Dull ache; patient stated off and on most of the day   . Dizziness    Frequently   . Hypertension    Patient had an episode when her B/P was 200 Systolic and she had been taking a lot of Tylenol  and she wasn't sure if it could have caused her B/P to go up this was 2 weeks ago     Date of Service: 09/15/2024 Date of Birth: 1946-03-25 PCP: Bernardo Norris, NP  History of Present Illness: [name] is a 78 y.o.female patient who history of known coronary artery disease previous STEMI PCI and stent to LAD proximal with DES for anterior STEMI patient was treated with Brilinta  aspirin  beta-blocker statin ACE inhibitor Lasix 's and MRA patient had depressed ventricular function moderately EF of 30 to 35% which improved [place] subsequent months up to around 50 has been relatively asymptomatic but had repeat cath in August 2022 because of vague chest pain symptoms with cath showed widely patent stent distal diffuse disease in the LAD which was previously known EF at the time was 55 to 60%.  She was treated medically..  Patient had done reasonably well until recently when back in August she complained of vertigo shortness of breath lower extremity edema she had a fall and had some bruising on her legs she had been reluctant to take anti-inflammatories because of chronic renal sufficiency but with worsening dyspnea shortness of breath and edema she presents for cardiology evaluation.  Denies any discrete episodes of chest pain chest discomfort similar to when she had a myocardial infarction just progressively got more dyspneic had trouble with her blood pressure which was running high was not exercising much had been followed for renal insufficiency but with generalized fatigue weakness dyspnea she presented to cardiology for follow-up assessment  Past Medical and Surgical History  Past Medical  History Past Medical History:  Diagnosis Date  . Coronary artery disease 08-17-1946  . GERD (gastroesophageal reflux disease)   . Hypertension   . Neuropathy   . Sleep apnea April, 2023    Past Surgical History She has a past surgical history that includes Hysterectomy Vaginal; other surgery; Repair Vaginal Prolapse; bunionectomy; and Cardiac catheterization.   Medications and Allergies  Current Medications  Current Outpatient Medications  Medication Sig Dispense Refill  . aspirin  81 MG chewable tablet Take 81 mg by mouth once daily    . atorvastatin  (LIPITOR ) 10 MG tablet TAKE 1 TABLET BY MOUTH EVERY DAY 90 tablet 3  . buPROPion  (WELLBUTRIN  XL) 300 MG XL tablet Take 300 mg by mouth once daily    . cyanocobalamin  (VITAMIN B12) 1000 MCG tablet Take 1,000 mcg by mouth once daily    . ezetimibe  (ZETIA ) 10 mg tablet Take 10 mg by mouth once daily    . famotidine  (PEPCID ) 20 MG tablet Take 20 mg by mouth 2 (two) times daily as needed for Heartburn    . FUROsemide  (LASIX ) 20 MG tablet Take 0.5 tablets (10 mg total) by mouth once daily (Patient taking differently: Take 10 mg by mouth as needed for Edema) 30 tablet 11  . gabapentin  (NEURONTIN ) 100 MG capsule Take 200 mg by mouth once daily    . gabapentin  (NEURONTIN ) 400 MG capsule Take 400 mg by mouth at bedtime    . hydroCHLOROthiazide  (HYDRODIURIL ) 25 MG tablet take 1 tablet by mouth every day 90 tablet 3  .  KLOR-CON  M20 20 mEq ER tablet TAKE 1 TABLET BY MOUTH ONCE DAILY 90 tablet 3  . levothyroxine  (SYNTHROID ) 75 MCG tablet Take 75 mcg by mouth once daily    . loperamide  (IMODIUM  A-D) 2 mg tablet Take by mouth 4 (four) times daily as needed    . metoprolol  succinate (TOPROL -XL) 25 MG XL tablet take 1 tablet by mouth everyday at bedtime 90 tablet 3  . nitroGLYcerin (NITROSTAT) 0.4 MG SL tablet as directed    . ondansetron  (ZOFRAN -ODT) 4 MG disintegrating tablet Take 4 mg by mouth every 8 (eight) hours as needed    . dapagliflozin  propanediol (FARXIGA) 10 mg tablet Take 1 tablet (10 mg total) by mouth once daily 90 tablet 3  . sacubitriL-valsartan (ENTRESTO) 24-26 mg tablet Take 1 tablet by mouth every 12 (twelve) hours 180 tablet 3  . spironolactone  (ALDACTONE ) 25 MG tablet Take 1 tablet (25 mg total) by mouth once daily 90 tablet 3   No current facility-administered medications for this visit.    Allergies: Morphine and Tetracycline  Social and Family History  Social History  reports that she has never smoked. She has never used smokeless tobacco. She reports current alcohol use of about 1.0 standard drink of alcohol per week. She reports that she does not use drugs.  Family History Family History  Problem Relation Name Age of Onset  . Diabetes Sister    . Cancer Brother Ronnie        colon  . Diabetes Maternal Grandmother    . High blood pressure (Hypertension) Mother Naomie     Review of Systems   Review of Systems: The patient denies chest pain, shortness of breath, orthopnea, paroxysmal nocturnal dyspnea, pedal edema, palpitations, heart racing, presyncope, syncope. Review of 12 Systems is negative except as described above.  Physical Examination   Vitals:BP (!) 140/98 (BP Location: Left upper arm, Patient Position: Sitting, BP Cuff Size: Adult)   Pulse 59   Resp 12   Ht 162.6 cm (5' 4)   Wt 79.1 kg (174 lb 6.4 oz)   SpO2 95%   BMI 29.94 kg/m  Ht:162.6 cm (5' 4) Wt:79.1 kg (174 lb 6.4 oz) ADJ:Anib surface area is 1.89 meters squared. Body mass index is 29.94 kg/m.  HEENT: Pupils equally reactive to light and accomodation  Neck: Supple without thyromegaly, carotid pulses 2+ Lungs: clear to auscultation bilaterally; no wheezes, rales, rhonchi Heart: Regular rate and rhythm.  No gallops, murmurs or rub Abdomen: soft nontender, nondistended, with normal bowel sounds Extremities: no cyanosis, clubbing, or 2+edema Peripheral Pulses: 2+ in all extremities, 2+ femoral pulses  bilaterally Neurologic: Alert and oriented X3; speech intact; face symmetrical; moves all extremities well  Cardiac MRI 09/06/2024 IMPRESSION:  1. Moderate to severely reduced LV systolic function (LVEF = 33%).   2. Near transmural LGE/scar in the mid-apical anteroseptal and  apical walls (LAD territory, appears non viable).   3. Normal RV systolic function.   4. No significant valvular abnormalities.   5. Findings consistent with ischemic cardiomyopathy, prior LAD  infarct.      Echocardiogram 08/22/2024 CONCLUSION ------------------------------------------------------------------------------- MODERATE LEFT VENTRICULAR SYSTOLIC DYSFUNCTION WITH MILD LVH ESTIMATED EF: 30%, CALC EF(2D): 30% NORMAL LA PRESSURES WITH DIASTOLIC DYSFUNCTION (GRADE 1) NORMAL RIGHT VENTRICULAR SYSTOLIC FUNCTION VALVULAR REGURGITATION: No AR, MILD MR, No PR, MILD TR ESTIMATED RVSP: 28 mmHg (Normal) NO VALVULAR STENOSIS ------------------------------------------------------------------------------------------ Prior Echo Study on 05/28/2021, Significant changes EF 30% Ant/apical hypokinesis    Lexiscan  Myoview  08/22/2024 MPRESSION: Abnormal myocardial  perfusion scan large area of anterior apical septal infarct no evidence of reversibility severely depressed left ventricular function EF=25-30% with anterior apical septal hypokinesis left ventricular enlargement no evidence of reversible ischemia.  Study consistent with an ischemic cardiomyopathy recommend medical therapy    CT angio chest PE protocol 07/31/2024 IMPRESSION:  1. No acute intrathoracic abnormality; specifically, no pulmonary  embolism, pneumonia, or pleural effusion.  2. Moderate cardiomegaly. Dense multi-vessel coronary  atherosclerosis.   Aortic Atherosclerosis (ICD10-I70.0).     Assessment   78 y.o. female with  1. Cardiomyopathy, unspecified type (CMS/HHS-HCC)   2. Chest pain at rest   3. OSA (obstructive sleep apnea)   4.  Coronary artery disease involving native coronary artery of native heart without angina pectoris   5. ST elevation myocardial infarction involving left anterior descending (LAD) coronary artery (CMS/HHS-HCC)   6. History of placement of stent in LAD coronary artery   7. Ischemic cardiomyopathy   8. Chronic heart failure with preserved ejection fraction (CMS/HHS-HCC)   9. Gastroesophageal reflux disease, unspecified whether esophagitis present   10. Mixed hyperlipidemia   11. Essential hypertension   12. Fatigue, unspecified type   13. H/O placement of stent in anterior descending branch of left coronary artery        Plan  Ischemic cardiomyopathy recurrent appeared to have improved since initial MI in 22 but now has distinct reduced left ventricular function since her previous echo with anterior apical hypokinesis consistent with an recent cardiac event.  Recommend GDMT for cardiomyopathy  Acute on chronic systolic congestive heart failure GDMT ARNI SGLT2 beta-blocker MRA referral to heart failure  Coronary artery disease recommend cardiac cath to evaluate coronary anatomy presumed occlusion of LAD unrecognized until heart failure symptoms  Presumed occlusion of LAD stent late presentation consider cardiac cath for identification of cardiac anatomy potential for revascularization  Hypertension aggressive antihypertensive therapy with typical heart failure management beta-blockers ARNI MRA  Chronic renal insufficiency appears to be reasonably stable will consider nephrology input  History of anterior STEMI 2022 with PCI and stent to proximal LAD diffuse distal LAD disease consider repeat cardiac catheter evaluation of coronary disease in light of new cardiomyopathy and worsening wall motion abnormalities  Hyperlipidemia congestive lipid management had had difficulty with statin therapy we will consider alternative to try to get LDL less than 55  Generalized fatigue weakness possibly  related to recent cardiac event congestive heart failure ischemic cardiomyopathy would consider cardiac rehab  Lower extremity edema consider diuretics heart failure therapy with ARNI SGLT2 MRA beta-blocker support stockings elevation  Obesity commend modest weight loss exercise portion control of at least 10% of current body weight  Recommend GDMT for cardiomyopathy Entresto SGLT2 MRA beta-blocker  Refer patient to heart failure for further evaluation and management  LifeVest for 90 days until reversible ischemia and coronary arteries evaluated and consideration for AICD EKG suggests narrow QRS so may not be a candidate for CRT  Consider cardiac cath for identification of coronary anatomy and potential for revascularization if no appears unlikely with the MRI findings  Have the patient follow-up in 1 month     Return in about 2 months (around 11/15/2024).  DWAYNE D CALLWOOD, MD  This dictation was prepared with dragon dictation.  Any transcription errors that result from this process are unintentional.

## 2024-09-20 ENCOUNTER — Encounter: Admission: RE | Disposition: A | Payer: Self-pay | Source: Home / Self Care

## 2024-09-20 ENCOUNTER — Ambulatory Visit: Admission: RE | Admit: 2024-09-20 | Discharge: 2024-09-20 | Disposition: A | Discharge status: Home / Self Care

## 2024-09-20 ENCOUNTER — Other Ambulatory Visit: Payer: Self-pay

## 2024-09-20 DIAGNOSIS — I509 Heart failure, unspecified: Secondary | ICD-10-CM | POA: Diagnosis not present

## 2024-09-20 DIAGNOSIS — I255 Ischemic cardiomyopathy: Secondary | ICD-10-CM | POA: Diagnosis not present

## 2024-09-20 DIAGNOSIS — E782 Mixed hyperlipidemia: Secondary | ICD-10-CM | POA: Insufficient documentation

## 2024-09-20 DIAGNOSIS — K219 Gastro-esophageal reflux disease without esophagitis: Secondary | ICD-10-CM | POA: Insufficient documentation

## 2024-09-20 DIAGNOSIS — I2510 Atherosclerotic heart disease of native coronary artery without angina pectoris: Secondary | ICD-10-CM | POA: Insufficient documentation

## 2024-09-20 DIAGNOSIS — G4733 Obstructive sleep apnea (adult) (pediatric): Secondary | ICD-10-CM | POA: Diagnosis not present

## 2024-09-20 DIAGNOSIS — I110 Hypertensive heart disease with heart failure: Secondary | ICD-10-CM | POA: Diagnosis not present

## 2024-09-20 DIAGNOSIS — I252 Old myocardial infarction: Secondary | ICD-10-CM | POA: Diagnosis not present

## 2024-09-20 DIAGNOSIS — I440 Atrioventricular block, first degree: Secondary | ICD-10-CM | POA: Diagnosis not present

## 2024-09-20 DIAGNOSIS — Z955 Presence of coronary angioplasty implant and graft: Secondary | ICD-10-CM | POA: Diagnosis not present

## 2024-09-20 HISTORY — PX: LEFT HEART CATH AND CORONARY ANGIOGRAPHY: CATH118249

## 2024-09-20 SURGERY — LEFT HEART CATH AND CORONARY ANGIOGRAPHY
Anesthesia: Moderate Sedation | Laterality: Left

## 2024-09-20 MED ORDER — ISOSORBIDE MONONITRATE ER 30 MG PO TB24: 30.0000 mg | ORAL_TABLET | Freq: Every day | ORAL | 0 refills | Status: AC

## 2024-09-20 MED ORDER — HEPARIN (PORCINE) IN NACL 1000-0.9 UT/500ML-% IV SOLN
INTRAVENOUS | Status: DC | PRN
  Administered 2024-09-20: 1000 mL

## 2024-09-20 MED ORDER — HYDRALAZINE HCL 20 MG/ML IJ SOLN: 10.0000 mg | INTRAMUSCULAR | Status: DC | PRN

## 2024-09-20 MED ORDER — FREE WATER
250.0000 mL | Freq: Once | Status: AC
  Administered 2024-09-20: 250 mL via ORAL

## 2024-09-20 MED ORDER — LIDOCAINE HCL (PF) 1 % IJ SOLN
INTRAMUSCULAR | Status: DC | PRN
  Administered 2024-09-20: 2 mL

## 2024-09-20 MED ORDER — SODIUM CHLORIDE 0.9% FLUSH: 3.0000 mL | Freq: Two times a day (BID) | INTRAVENOUS | Status: DC

## 2024-09-20 MED ORDER — HEPARIN (PORCINE) IN NACL 1000-0.9 UT/500ML-% IV SOLN
INTRAVENOUS | Status: AC
  Filled 2024-09-20: qty 1000

## 2024-09-20 MED ORDER — MIDAZOLAM HCL 2 MG/2ML IJ SOLN
INTRAMUSCULAR | Status: AC
  Filled 2024-09-20: qty 2

## 2024-09-20 MED ORDER — ONDANSETRON HCL 4 MG/2ML IJ SOLN: 4.0000 mg | Freq: Four times a day (QID) | INTRAMUSCULAR | Status: DC | PRN

## 2024-09-20 MED ORDER — FENTANYL CITRATE (PF) 100 MCG/2ML IJ SOLN
INTRAMUSCULAR | Status: DC | PRN
  Administered 2024-09-20: 50 ug via INTRAVENOUS

## 2024-09-20 MED ORDER — LABETALOL HCL 5 MG/ML IV SOLN: 10.0000 mg | INTRAVENOUS | Status: DC | PRN

## 2024-09-20 MED ORDER — SODIUM CHLORIDE 0.9 % IV SOLN: 250.0000 mL | INTRAVENOUS | Status: DC | PRN

## 2024-09-20 MED ORDER — IOHEXOL 300 MG/ML  SOLN
INTRAMUSCULAR | Status: DC | PRN
  Administered 2024-09-20: 44 mL

## 2024-09-20 MED ORDER — FREE WATER
500.0000 mL | Freq: Once | Status: AC
  Administered 2024-09-20: 500 mL via ORAL

## 2024-09-20 MED ORDER — VERAPAMIL HCL 2.5 MG/ML IV SOLN
INTRAVENOUS | Status: AC
  Filled 2024-09-20: qty 2

## 2024-09-20 MED ORDER — ACETAMINOPHEN 325 MG PO TABS: 650.0000 mg | ORAL_TABLET | ORAL | Status: DC | PRN

## 2024-09-20 MED ORDER — LIDOCAINE HCL 1 % IJ SOLN
INTRAMUSCULAR | Status: AC
  Filled 2024-09-20: qty 20

## 2024-09-20 MED ORDER — HEPARIN SODIUM (PORCINE) 1000 UNIT/ML IJ SOLN
INTRAMUSCULAR | Status: AC
  Filled 2024-09-20: qty 10

## 2024-09-20 MED ORDER — HEPARIN SODIUM (PORCINE) 1000 UNIT/ML IJ SOLN
INTRAMUSCULAR | Status: DC | PRN
  Administered 2024-09-20: 5000 [IU] via INTRAVENOUS

## 2024-09-20 MED ORDER — VERAPAMIL HCL 2.5 MG/ML IV SOLN
INTRAVENOUS | Status: DC | PRN
  Administered 2024-09-20: 2.5 mg via INTRA_ARTERIAL

## 2024-09-20 MED ORDER — SODIUM CHLORIDE 0.9% FLUSH: 3.0000 mL | INTRAVENOUS | Status: DC | PRN

## 2024-09-20 MED ORDER — MIDAZOLAM HCL 2 MG/2ML IJ SOLN
INTRAMUSCULAR | Status: DC | PRN
  Administered 2024-09-20: 1 mg via INTRAVENOUS

## 2024-09-20 MED ORDER — SODIUM CHLORIDE 0.9 % IV SOLN
250.0000 mL | INTRAVENOUS | Status: DC | PRN
  Administered 2024-09-20: 250 mL via INTRAVENOUS

## 2024-09-20 MED ORDER — FENTANYL CITRATE (PF) 100 MCG/2ML IJ SOLN
INTRAMUSCULAR | Status: AC
  Filled 2024-09-20: qty 2

## 2024-09-20 MED ORDER — ASPIRIN 81 MG PO CHEW
81.0000 mg | CHEWABLE_TABLET | ORAL | Status: DC
Start: 2024-09-20 — End: 2024-09-20

## 2024-09-20 SURGICAL SUPPLY — 8 items
CATH INFINITI 5 FR JL3.5 (CATHETERS) IMPLANT
CATH INFINITI JR4 5F (CATHETERS) IMPLANT
DEVICE RAD TR BAND REGULAR (VASCULAR PRODUCTS) IMPLANT
DRAPE BRACHIAL (DRAPES) IMPLANT
GLIDESHEATH SLEND A-KIT 6F 22G (SHEATH) IMPLANT
GUIDEWIRE INQWIRE 1.5J.035X260 (WIRE) IMPLANT
PACK CARDIAC CATH (CUSTOM PROCEDURE TRAY) ×1 IMPLANT
STATION PROTECTION PRESSURIZED (MISCELLANEOUS) IMPLANT

## 2024-09-21 ENCOUNTER — Encounter: Payer: Self-pay | Admitting: Internal Medicine

## 2024-09-23 DIAGNOSIS — I252 Old myocardial infarction: Secondary | ICD-10-CM | POA: Diagnosis not present

## 2024-09-27 DIAGNOSIS — M25562 Pain in left knee: Secondary | ICD-10-CM | POA: Diagnosis not present

## 2024-10-03 DIAGNOSIS — R7309 Other abnormal glucose: Secondary | ICD-10-CM | POA: Diagnosis not present

## 2024-10-03 DIAGNOSIS — I5020 Unspecified systolic (congestive) heart failure: Secondary | ICD-10-CM | POA: Diagnosis not present

## 2024-10-19 NOTE — Progress Notes (Signed)
 MALAINA MORTELLARO                                          MRN: [id number]   10/19/2024   The [facility] Quality Team Specialist reviewed this patient medical record for the purposes of chart review for care gap closure. The following were reviewed: chart review for care gap closure-controlling blood pressure.    [facility]I Quality Team

## 2024-10-30 DIAGNOSIS — M1712 Unilateral primary osteoarthritis, left knee: Secondary | ICD-10-CM | POA: Diagnosis not present

## 2024-10-30 DIAGNOSIS — M2550 Pain in unspecified joint: Secondary | ICD-10-CM | POA: Diagnosis not present

## 2024-11-03 ENCOUNTER — Other Ambulatory Visit: Payer: Self-pay | Admitting: Internal Medicine

## 2024-11-03 DIAGNOSIS — G2581 Restless legs syndrome: Secondary | ICD-10-CM

## 2024-11-05 ENCOUNTER — Other Ambulatory Visit: Payer: Self-pay | Admitting: Internal Medicine

## 2024-11-05 NOTE — Telephone Encounter (Signed)
 Requested Prescriptions  Pending Prescriptions Disp Refills   gabapentin  (NEURONTIN ) 100 MG capsule [Pharmacy Med Name: GABAPENTIN  100 MG CAPSULE] 60 capsule 2    Sig: TAKE 2 CAPSULES BY MOUTH EVERY DAY     Neurology: Anticonvulsants - gabapentin  Failed - 11/05/2024 11:22 AM      Failed - Cr in normal range and within 360 days    Creat  Date Value Ref Range Status  06/05/2024 1.17 (H) 0.60 - 1.00 mg/dL Final         Passed - Completed PHQ-2 or PHQ-9 in the last 360 days      Passed - Valid encounter within last 12 months    Recent Outpatient Visits           3 months ago Swelling of left lower extremity   [facility]end[facility]   5 months ago Primary hypertension   [facility] Bernardo Fend, DO   6 months ago Dysuria   [facility] Gareth Mliss FALCON, FNP   6 months ago Bilateral impacted cerumen   [facility] Bernardo Fend, DO       Future Appointments             In 1 month Bernardo Fend, DO [facility] [place]

## 2024-11-06 ENCOUNTER — Encounter: Payer: Self-pay | Admitting: Internal Medicine

## 2024-11-07 NOTE — Telephone Encounter (Signed)
 Requested Prescriptions  Pending Prescriptions Disp Refills   ezetimibe  (ZETIA ) 10 MG tablet [Pharmacy Med Name: EZETIMIBE  10 MG TABLET] 90 tablet 1    Sig: TAKE 1 TABLET BY MOUTH EVERY DAY     Cardiovascular:  Antilipid - Sterol Transport Inhibitors Failed - 11/07/2024  1:03 PM      Failed - Lipid Panel in normal range within the last 12 months    Cholesterol  Date Value Ref Range Status  12/06/2023 117 <200 mg/dL Final   LDL Cholesterol (Calc)  Date Value Ref Range Status  12/06/2023 40 mg/dL (calc) Final    Comment:    Reference range: <100 . Desirable range <100 mg/dL for primary prevention;   <70 mg/dL for patients with CHD or diabetic patients  with > or = 2 CHD risk factors. SABRA LDL-C is now calculated using the Martin-Hopkins  calculation, which is a validated novel method providing  better accuracy than the Friedewald equation in the  estimation of LDL-C.  Gladis APPLETHWAITE et al. SANDREA. 7986;689(80): 2061-2068  (http://education.[facility].com/faq/FAQ164)    HDL  Date Value Ref Range Status  12/06/2023 52 > OR = 50 mg/dL Final   Triglycerides  Date Value Ref Range Status  12/06/2023 176 (H) <150 mg/dL Final         Passed - AST in normal range and within 360 days    AST  Date Value Ref Range Status  12/06/2023 18 10 - 35 U/L Final         Passed - ALT in normal range and within 360 days    ALT  Date Value Ref Range Status  12/06/2023 15 6 - 29 U/L Final         Passed - Patient is not pregnant      Passed - Valid encounter within last 12 months    Recent Outpatient Visits           3 months ago Swelling of left lower extremity   [facility] Bernardo Fend, DO   5 months ago Primary hypertension   [facility] Bernardo Fend, DO   6 months ago Dysuria   [facility] Gareth Mliss FALCON, FNP   6 months ago Bilateral impacted cerumen   [facility] Bernardo Fend, DO       Future Appointments             In 4 weeks Bernardo Fend, DO [facility] [place]

## 2024-11-08 DIAGNOSIS — M25562 Pain in left knee: Secondary | ICD-10-CM | POA: Diagnosis not present

## 2024-11-10 DIAGNOSIS — M1712 Unilateral primary osteoarthritis, left knee: Secondary | ICD-10-CM | POA: Diagnosis not present

## 2024-11-20 ENCOUNTER — Ambulatory Visit

## 2024-11-20 VITALS — BP 126/83 | HR 72 | Ht 64.0 in | Wt 165.0 lb

## 2024-11-20 DIAGNOSIS — Z0000 Encounter for general adult medical examination without abnormal findings: Secondary | ICD-10-CM

## 2024-11-20 NOTE — Patient Instructions (Addendum)
 [name],  Thank you for taking the time for your Medicare Wellness Visit. I appreciate your continued commitment to your health goals. Please review the care plan we discussed, and feel free to reach out if I can assist you further.  Please note that Annual Wellness Visits do not include a physical exam. Some assessments may be limited, especially if the visit was conducted virtually. If needed, we may recommend an in-person follow-up with your provider.  Ongoing Care Seeing your primary care provider every 3 to 6 months helps us  monitor your health and provide consistent, personalized care. Next office visit on 12/05/2024.  You are due for a 2nd Shingles vaccine and can get that done at your local pharmacy.    Referrals If a referral was made during today's visit and you haven't received any updates within two weeks, please contact the referred provider directly to check on the status.  Recommended Screenings:  Health Maintenance  Topic Date Due   Zoster (Shingles) Vaccine (2 of 2) 02/24/2011   COVID-19 Vaccine (3 - Moderna risk series) 04/13/2020   Medicare Annual Wellness Visit  04/28/2024   Flu Shot  03/20/2025*   Pneumococcal Vaccine for age over 5  Completed   Osteoporosis screening with Bone Density Scan  Completed   Hepatitis C Screening  Completed   Meningitis B Vaccine  Aged Out   DTaP/Tdap/Td vaccine  Discontinued   Breast Cancer Screening  Discontinued   Colon Cancer Screening  Discontinued  *Topic was postponed. The date shown is not the original due date.       11/20/2024   12:13 PM  Advanced Directives  Does Patient Have a Medical Advance Directive? No    Vision: Annual vision screenings are recommended for early detection of glaucoma, cataracts, and diabetic retinopathy. These exams can also reveal signs of chronic conditions such as diabetes and high blood pressure.  Dental: Annual dental screenings help detect early signs of oral cancer, gum disease, and  other conditions linked to overall health, including heart disease and diabetes.  Please see the attached documents for additional preventive care recommendations.

## 2024-11-20 NOTE — Progress Notes (Signed)
 Chief Complaint  Patient presents with   Medicare Wellness     Subjective:   Karen Madden is a 78 y.o. female who presents for a Medicare Annual Wellness Visit.  Visit info / Clinical Intake: Medicare Wellness Visit Type:: Subsequent Annual Wellness Visit Persons participating in visit and providing information:: patient Medicare Wellness Visit Mode:: Video Since this visit was completed virtually, some vitals may be partially provided or unavailable. Missing vitals are due to the limitations of the virtual format.: Documented vitals are patient reported If Telephone or Video please confirm:: I connected with patient using audio/video enable telemedicine. I verified patient identity with two identifiers, discussed telehealth limitations, and patient agreed to proceed. Patient Location:: Home Provider Location:: Home Interpreter Needed?: No Pre-visit prep was completed: yes AWV questionnaire completed by patient prior to visit?: yes Date:: 11/20/24 Living arrangements:: lives with spouse/significant other Patient's Overall Health Status Rating: good Typical amount of pain: some Does pain affect daily life?: no Are you currently prescribed opioids?: (!) yes  Dietary Habits and Nutritional Risks How many meals a day?: 2 Eats fruit and vegetables daily?: (!) no (not daily) Most meals are obtained by: preparing own meals In the last 2 weeks, have you had any of the following?: (!) nausea, vomiting, diarrhea (nausea due medication) Diabetic:: no  Functional Status Activities of Daily Living (to include ambulation/medication): (Patient-Rptd) Independent Ambulation: Independent with device- listed below Home Assistive Devices/Equipment: Cane Medication Administration: Independent Home Management: (Patient-Rptd) Independent Manage your own finances?: yes Primary transportation is: driving Concerns about vision?: no *vision screening is required for WTM* Concerns about  hearing?: (!) yes (rt ear) Uses hearing aids?: no Hear whispered voice?: yes  Fall Screening Falls in the past year?: (Patient-Rptd) 1 Number of falls in past year: (Patient-Rptd) 0 Was there an injury with Fall?: (Patient-Rptd) 0 Fall Risk Category Calculator: (Patient-Rptd) 1 Patient Fall Risk Level: (Patient-Rptd) Low Fall Risk  Fall Risk Patient at Risk for Falls Due to: Impaired balance/gait Fall risk Follow up: Falls evaluation completed; Falls prevention discussed  Home and Transportation Safety: All rugs have non-skid backing?: yes All stairs or steps have railings?: yes Grab bars in the bathtub or shower?: yes Have non-skid surface in bathtub or shower?: yes Good home lighting?: yes Regular seat belt use?: yes Hospital stays in the last year:: no  Cognitive Assessment Difficulty concentrating, remembering, or making decisions? : no Will 6CIT or Mini Cog be Completed: no 6CIT or Mini Cog Declined: patient alert, oriented, able to answer questions appropriately and recall recent events  Advance Directives (For Healthcare) Does Patient Have a Medical Advance Directive?: No  Reviewed/Updated  Reviewed/Updated: Reviewed All (Medical, Surgical, Family, Medications, Allergies, Care Teams, Patient Goals)    Allergies (verified) Cyclobenzaprine, Morphine and codeine, Tetracyclines & related, and Benzonatate    Current Medications (verified) Outpatient Encounter Medications as of 11/20/2024  Medication Sig   aspirin  81 MG chewable tablet Chew 81 mg by mouth daily.   atorvastatin  (LIPITOR ) 10 MG tablet Take 1 tablet (10 mg total) by mouth daily.   buPROPion  (WELLBUTRIN  XL) 300 MG 24 hr tablet TAKE 1 TABLET BY MOUTH EVERY DAY   dapagliflozin propanediol (FARXIGA) 10 MG TABS tablet Take 10 mg by mouth daily.   ENTRESTO 49-51 MG Take 1 tablet by mouth 2 (two) times daily.   ezetimibe  (ZETIA ) 10 MG tablet TAKE 1 TABLET BY MOUTH EVERY DAY   famotidine  (PEPCID ) 20 MG tablet  Take 20 mg by mouth 2 (two) times daily.  gabapentin  (NEURONTIN ) 100 MG capsule TAKE 2 CAPSULES BY MOUTH EVERY DAY   gabapentin  (NEURONTIN ) 400 MG capsule Take 1 capsule (400 mg total) by mouth at bedtime.   isosorbide  mononitrate (IMDUR ) 30 MG 24 hr tablet Take 1 tablet (30 mg total) by mouth daily.   levothyroxine  (SYNTHROID ) 75 MCG tablet Take 1 tablet (75 mcg total) by mouth daily.   loperamide  (IMODIUM  A-D) 2 MG tablet Take 2 mg by mouth 4 (four) times daily as needed for diarrhea or loose stools.   metoprolol  succinate (TOPROL -XL) 50 MG 24 hr tablet Take 50 mg by mouth daily.   Metoprolol -Hydrochlorothiazide  50-12.5 MG TB24 Take 50 mg by mouth daily.   MOUNJARO 5 MG/0.5ML Pen Inject 5 mg into the skin once a week.   nitroGLYCERIN (NITROSTAT) 0.4 MG SL tablet Place 0.4 mg under the tongue every 2 (two) hours as needed for chest pain.   sacubitril-valsartan (ENTRESTO) 24-26 MG Take 1 tablet by mouth every 12 (twelve) hours.   spironolactone  (ALDACTONE ) 25 MG tablet Take 25 mg by mouth daily.   traMADol (ULTRAM) 50 MG tablet Take 50 mg by mouth every 12 (twelve) hours as needed for moderate pain (pain score 4-6).   estradiol  (ESTRACE ) 0.1 MG/GM vaginal cream Estrogen Cream Instruction Discard applicator Apply pea sized amount to tip of finger to urethra before bed. Wash hands well after application. Use Monday, Wednesday and Friday (Patient not taking: Reported on 11/20/2024)   furosemide  (LASIX ) 20 MG tablet Take 0.5 tablets (10 mg total) by mouth daily. (Patient taking differently: Take 10 mg by mouth daily as needed for edema.)   hydrochlorothiazide  (HYDRODIURIL ) 25 MG tablet Take 1 tablet (25 mg total) by mouth daily.   KLOR-CON  M20 20 MEQ tablet Take 1 tablet (20 mEq total) by mouth daily. (Patient not taking: Reported on 09/18/2024)   metoprolol  succinate (TOPROL -XL) 25 MG 24 hr tablet Take 25 mg by mouth daily.   No facility-administered encounter medications on file as of 11/20/2024.     History: Past Medical History:  Diagnosis Date   Allergy Na   Anginal pain    Arthritis NA   Cancer (HCC) 2017   Cataract 2005 & 2007   CHF (congestive heart failure) (HCC)    Coronary artery disease    Dyspnea    GERD (gastroesophageal reflux disease)    Hypertension    Hypothyroidism    Iron  deficiency anemia 08/18/2022   Myocardial infarction (HCC) 12/22/20   Sleep apnea 2023   Past Surgical History:  Procedure Laterality Date   ABDOMINAL HYSTERECTOMY  1991   APPENDECTOMY  2016   BLADDER SURGERY     2011   BUNIONECTOMY     CATARACT EXTRACTION Bilateral    CHOLECYSTECTOMY  2016   COLON SURGERY  2016 & 2017   CORONARY/GRAFT ACUTE MI REVASCULARIZATION N/A 12/22/2020   Procedure: Coronary/Graft Acute MI Revascularization;  Surgeon: Florencio Cara BIRCH, MD;  Location: [facility] INVASIVE CV LAB;  Service: Cardiovascular;  Laterality: N/A;   HERNIA REPAIR  2021   LEFT HEART CATH AND CORONARY ANGIOGRAPHY N/A 12/22/2020   Procedure: LEFT HEART CATH AND CORONARY ANGIOGRAPHY;  Surgeon: Florencio Cara BIRCH, MD;  Location: [facility] INVASIVE CV LAB;  Service: Cardiovascular;  Laterality: N/A;   LEFT HEART CATH AND CORONARY ANGIOGRAPHY N/A 07/21/2021   Procedure: LEFT HEART CATH AND CORONARY ANGIOGRAPHY with possible Intervention;  Surgeon: Florencio Cara BIRCH, MD;  Location: [facility] INVASIVE CV LAB;  Service: Cardiovascular;  Laterality: N/A;   LEFT HEART CATH  AND CORONARY ANGIOGRAPHY Left 09/20/2024   Procedure: LEFT HEART CATH AND CORONARY ANGIOGRAPHY;  Surgeon: Katina Albright, MD;  Location: [facility] INVASIVE CV LAB;  Service: Cardiovascular;  Laterality: Left;   Family History  Problem Relation Age of Onset   Heart disease Mother    Hypertension Mother    Obesity Mother    Vision loss Mother    Alcohol abuse Father    Cancer Brother    Diabetes Sister    Varicose Veins Daughter    Social History   Occupational History   Occupation: RETIRED  Tobacco Use   Smoking status: Never   Smokeless  tobacco: Never  Vaping Use   Vaping status: Never Used  Substance and Sexual Activity   Alcohol use: Not Currently   Drug use: Never   Sexual activity: Yes   Tobacco Counseling Counseling given: Not Answered  SDOH Screenings   Food Insecurity: No Food Insecurity (11/20/2024)  Housing: Low Risk  (11/20/2024)  Transportation Needs: No Transportation Needs (11/20/2024)  Utilities: Not At Risk (11/20/2024)  Alcohol Screen: Low Risk  (06/01/2024)  Depression (PHQ2-9): Low Risk  (11/20/2024)  Financial Resource Strain: Low Risk  (11/20/2024)  Physical Activity: Insufficiently Active (11/20/2024)  Social Connections: Socially Integrated (11/20/2024)  Stress: No Stress Concern Present (11/20/2024)  Tobacco Use: Low Risk  (11/20/2024)  Health Literacy: Adequate Health Literacy (11/20/2024)   See flowsheets for full screening details  Depression Screen PHQ 2 & 9 Depression Scale- Over the past 2 weeks, how often have you been bothered by any of the following problems? Little interest or pleasure in doing things: 0 Feeling down, depressed, or hopeless (PHQ Adolescent also includes...irritable): 0 PHQ-2 Total Score: 0 Trouble falling or staying asleep, or sleeping too much: 0 Feeling tired or having little energy: 1 Poor appetite or overeating (PHQ Adolescent also includes...weight loss): 0 Feeling bad about yourself - or that you are a failure or have let yourself or your family down: 0 Trouble concentrating on things, such as reading the newspaper or watching television (PHQ Adolescent also includes...like school work): 0 Moving or speaking so slowly that other people could have noticed. Or the opposite - being so fidgety or restless that you have been moving around a lot more than usual: 0 Thoughts that you would be better off dead, or of hurting yourself in some way: 0 PHQ-9 Total Score: 1  Depression Treatment Depression Interventions/Treatment : PHQ2-9 Score <4 Follow-up Not Indicated      Goals Addressed             This Visit's Progress    My goal for 2024 is to lose 10-20 pounds by continue to walk and stay active.   On track            Objective:    Today's Vitals   11/20/24 1425  BP: 126/83  Pulse: 72  Weight: 165 lb (74.8 kg)  Height: 5' 4 (1.626 m)   Body mass index is 28.32 kg/m.  Hearing/Vision screen Hearing Screening - Comments:: Rt ear hearing low-per pt Vision Screening - Comments:: Wears eyeglasses/[facility]/UTD Immunizations and Health Maintenance Health Maintenance  Topic Date Due   Zoster Vaccines- Shingrix (2 of 2) 02/24/2011   COVID-19 Vaccine (3 - Moderna risk series) 04/13/2020   Influenza Vaccine  03/20/2025 (Originally 07/21/2024)   Medicare Annual Wellness (AWV)  11/20/2025   Pneumococcal Vaccine: 50+ Years  Completed   Bone Density Scan  Completed   Hepatitis C Screening  Completed  Meningococcal B Vaccine  Aged Out   DTaP/Tdap/Td  Discontinued   Mammogram  Discontinued   Colonoscopy  Discontinued        Assessment/Plan:  This is a routine wellness examination for Amariss.  Patient Care Team: Bernardo Fend, DO as PCP - General (Internal Medicine) [facility] as Consulting Physician (Ophthalmology) Jacobo Evalene PARAS, MD as Consulting Physician (Oncology)  I have personally reviewed and noted the following in the patient's chart:   Medical and social history Use of alcohol, tobacco or illicit drugs  Current medications and supplements including opioid prescriptions. Functional ability and status Nutritional status Physical activity Advanced directives List of other physicians Hospitalizations, surgeries, and ER visits in previous 12 months Vitals Screenings to include cognitive, depression, and falls Referrals and appointments  No orders of the defined types were placed in this encounter.  In addition, I have reviewed and discussed with patient certain preventive protocols, quality metrics,  and best practice recommendations. A written personalized care plan for preventive services as well as general preventive health recommendations were provided to patient.   Sherika Kubicki L Comer Devins, CMA   11/20/2024   Return in 1 year (on 11/20/2025).  After Visit Summary: (MyChart) Due to this being a telephonic visit, the after visit summary with patients personalized plan was offered to patient via MyChart   Nurse Notes: Patient declines the Flu vaccine.  She is due fro a 2nd Shingrix vaccine.  Patient is up to date on all other health maintenance with no concerns to address today.

## 2024-12-05 ENCOUNTER — Other Ambulatory Visit: Payer: Self-pay

## 2024-12-05 ENCOUNTER — Ambulatory Visit (INDEPENDENT_AMBULATORY_CARE_PROVIDER_SITE_OTHER): Admitting: Internal Medicine

## 2024-12-05 VITALS — BP 124/76 | HR 81 | Temp 97.6°F | Resp 18 | Ht 64.0 in | Wt 165.6 lb

## 2024-12-05 DIAGNOSIS — E1165 Type 2 diabetes mellitus with hyperglycemia: Secondary | ICD-10-CM

## 2024-12-05 DIAGNOSIS — G2581 Restless legs syndrome: Secondary | ICD-10-CM

## 2024-12-05 DIAGNOSIS — E559 Vitamin D deficiency, unspecified: Secondary | ICD-10-CM

## 2024-12-05 DIAGNOSIS — I509 Heart failure, unspecified: Secondary | ICD-10-CM

## 2024-12-05 DIAGNOSIS — E663 Overweight: Secondary | ICD-10-CM | POA: Diagnosis not present

## 2024-12-05 DIAGNOSIS — Z7984 Long term (current) use of oral hypoglycemic drugs: Secondary | ICD-10-CM

## 2024-12-05 DIAGNOSIS — E039 Hypothyroidism, unspecified: Secondary | ICD-10-CM | POA: Diagnosis not present

## 2024-12-05 DIAGNOSIS — R5383 Other fatigue: Secondary | ICD-10-CM

## 2024-12-05 DIAGNOSIS — I10 Essential (primary) hypertension: Secondary | ICD-10-CM | POA: Diagnosis not present

## 2024-12-05 DIAGNOSIS — Z7985 Long-term (current) use of injectable non-insulin antidiabetic drugs: Secondary | ICD-10-CM | POA: Diagnosis not present

## 2024-12-05 DIAGNOSIS — I2510 Atherosclerotic heart disease of native coronary artery without angina pectoris: Secondary | ICD-10-CM

## 2024-12-05 MED ORDER — EZETIMIBE 10 MG PO TABS: 10.0000 mg | ORAL_TABLET | Freq: Every day | ORAL | 0 refills | Status: AC

## 2024-12-05 MED ORDER — GABAPENTIN 400 MG PO CAPS: 400.0000 mg | ORAL_CAPSULE | Freq: Every day | ORAL | 3 refills | Status: AC

## 2024-12-05 MED ORDER — ATORVASTATIN CALCIUM 10 MG PO TABS: 10.0000 mg | ORAL_TABLET | Freq: Every day | ORAL | 1 refills | Status: AC

## 2024-12-05 NOTE — Progress Notes (Signed)
 Established Patient Office Visit  Subjective   Patient ID: Karen Madden, female    DOB: Apr 08, 1946  Age: 78 y.o. MRN: [id number]  Chief Complaint  Patient presents with   Medical Management of Chronic Issues    6 month recheck    HPI  Patient is here for follow up on chronic medical conditions.    Discussed the use of AI scribe software for clinical note transcription with the patient, who gave verbal consent to proceed.  History of Present Illness  Karen Madden is a 78 year old female with heart failure and diabetes who presents for a routine checkup and medication review.  She has heart failure with an ejection fraction of 30% on recent echocardiogram and shortness of breath that worsened since the summer. Her heart failure is treated with Farxiga, Entresto, spironolactone , and metoprolol , with Lasix  prescribed as needed for swelling, though she is not taking it currently. She also has coronary artery disease with open stents and severe small vessel disease. She is on a statin and aspirin .   She has newly diagnosed diabetes with an A1c 6.5% in October and was started on Farxiga and Mounjaro, now on 5 mg for weight management and has lost 9 pounds without side effects. This was prescribed from her CHF specialist. So far she is tolerating well.   She has knee arthritis with a Baker's cyst treated recently with fluid aspiration and gel injections. She has significant knee pain and swelling that limit mobility and make it hard to rise from low chairs.  Her current medications include Lipitor  10 mg, Zetia , aspirin , metoprolol  50 mg, Farxiga 10 mg, Entresto, spironolactone  25 mg, and gabapentin  200 mg in the morning and 400 mg at bedtime. She is not taking Lasix  or potassium. She has no urinary or vaginal symptoms, no ankle swelling, and no gastrointestinal side effects from Mounjaro.  Hx of STEMI/HTN/OSA/CHFpEF: -Medications: Lasix  10 mg PRN, Metoprolol  50 mg, Farxiga 10 mg, Entresto,  Spironolactone  25 mg, nitroglycerin PRN -Cardiology discontinued hydrochlorothiazide   -Patient is compliant with above medications and reports no side effects. -Denies any SOB, CP, vision changes, LE edema or symptoms of hypotension -Most recent echo 6/22 EF >55%, per patient she had a more recent echo and EFT down to 30% -Hx of STEMI 12/22/20 - 1 stent in LAD, following with Cardiology  CAD/HLD: -Medications: aspirin  81, Lipitor  10 mg, Zetia  10 mg  -Patient is compliant with above medications and reports no side effects.  -Last lipid panel:Lipid Panel     Component Value Date/Time   CHOL 117 12/06/2023 1425   TRIG 176 (H) 12/06/2023 1425   HDL 52 12/06/2023 1425   CHOLHDL 2.3 12/06/2023 1425   VLDL 29 12/22/2020 1521   LDLCALC 40 12/06/2023 1425    Hypothyroidism: -Medications: Levothyroxine  75 mcg  -Patient is compliant with the above medication (s) at the above dose and reports no medication side effects.  -Denies weight changes, cold./heat intolerance, skin changes, anxiety/palpitations  -Last TSH: 1.78 12/24  GERD: -Currently using Pepcid  20 mg over the counter PRN  Hx of Colon Cancer:  -First diagnosed in 2016 - had a large polyp on cecum. Then hgb was low, passed out. Had a second tumor removed in 2017. Had a partial colectomy.  -Following with GI   MDD: -Mood status: stable -Current treatment: Wellbutrin  300 mg XL -Satisfied with current treatment?: yes -Symptom severity: mild  -Duration of current treatment : years -Side effects: no Medication compliance: excellent compliance  11/20/2024    2:46 PM 06/05/2024    2:20 PM 12/06/2023    1:40 PM 11/25/2023   10:26 AM 11/11/2023   12:55 PM  Depression screen PHQ 2/9  Decreased Interest 0 0 0 0 0  Down, Depressed, Hopeless 0 0 0 0 0  PHQ - 2 Score 0 0 0 0 0  Altered sleeping 0    0  Tired, decreased energy 1    0  Change in appetite 0    0  Feeling bad or failure about yourself  0    0  Trouble concentrating  0    0  Moving slowly or fidgety/restless 0    0  Suicidal thoughts 0    0  PHQ-9 Score 1    0   Difficult doing work/chores     Not difficult at all     Data saved with a previous flowsheet row definition    Health Maintenance: -Blood work due -Mammogram 9/25 Birads-1  Patient Active Problem List   Diagnosis Date Noted   Stage 3b chronic kidney disease (HCC) 06/05/2024   Restless leg 12/06/2023   Screening mammogram, encounter for 05/31/2023   B12 deficiency 10/13/2022   Cataracts, bilateral 09/01/2022   Iron  deficiency anemia 08/18/2022   Vertigo 02/16/2022   CAD (coronary artery disease) 12/23/2020   HTN (hypertension) 12/23/2020   COVID-19 virus infection 12/23/2020   Pneumonia due to COVID-19 virus 12/23/2020   STEMI involving left anterior descending coronary artery (HCC) 12/22/2020   STEMI (ST elevation myocardial infarction) (HCC) 12/22/2020   Depression 08/02/2020   Hernia of anterior abdominal wall 10/06/2019   Disorder of tendon of right biceps 10/05/2019   Advance care planning 10/03/2019   Right lateral epicondylitis 06/09/2019   Bilateral carotid artery disease 08/23/2018   Hypercholesteremia 04/10/2018   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/2018   Recurrent major depressive disorder, in partial remission 10/22/2017   Abdominal aortic atherosclerosis 09/04/2017   Vitamin D  deficiency 05/28/2017   Dermatofibroma 05/10/2017   History of colon cancer 10/13/2016   Chronic insomnia 06/02/2016   History of basal cell cancer 06/02/2016   Chronic low back pain without sciatica 06/02/2016   Hearing loss 08/08/2015   Overweight (BMI 25.0-29.9) 03/15/2014   Osteoarthritis 02/20/2014   Hypothyroidism 02/20/2014   Muscle weakness 09/13/2013   Leg pain 09/13/2013   Past Medical History:  Diagnosis Date   Allergy Na   Anginal pain    Arthritis NA   Cancer (HCC) 2017   Cataract 2005 & 2007   CHF (congestive heart failure) (HCC)    Coronary artery disease    Dyspnea     GERD (gastroesophageal reflux disease)    Hypertension    Hypothyroidism    Iron  deficiency anemia 08/18/2022   Myocardial infarction (HCC) 12/22/20   Sleep apnea 2023   Past Surgical History:  Procedure Laterality Date   ABDOMINAL HYSTERECTOMY  1991   APPENDECTOMY  2016   BLADDER SURGERY     2011   BUNIONECTOMY     CATARACT EXTRACTION Bilateral    CHOLECYSTECTOMY  2016   COLON SURGERY  2016 & 2017   CORONARY/GRAFT ACUTE MI REVASCULARIZATION N/A 12/22/2020   Procedure: Coronary/Graft Acute MI Revascularization;  Surgeon: Florencio Cara BIRCH, MD;  Location: [facility];  Service: Cardiovascular;  Laterality: N/A;   HERNIA REPAIR  2021   LEFT HEART CATH AND CORONARY ANGIOGRAPHY N/A 12/22/2020   Procedure: LEFT HEART CATH AND CORONARY ANGIOGRAPHY;  Surgeon: Callwood, Dwayne  D, MD;  Location: [facility];  Service: Cardiovascular;  Laterality: N/A;   LEFT HEART CATH AND CORONARY ANGIOGRAPHY N/A 07/21/2021   Procedure: LEFT HEART CATH AND CORONARY ANGIOGRAPHY with possible Intervention;  Surgeon: Florencio Cara BIRCH, MD;  Location: [facility];  Service: Cardiovascular;  Laterality: N/A;   LEFT HEART CATH AND CORONARY ANGIOGRAPHY Left 09/20/2024   Procedure: LEFT HEART CATH AND CORONARY ANGIOGRAPHY;  Surgeon: Katina Albright, MD;  Location: [facility];  Service: Cardiovascular;  Laterality: Left;   Social History   Tobacco Use   Smoking status: Never   Smokeless tobacco: Never  Vaping Use   Vaping status: Never Used  Substance Use Topics   Alcohol use: Not Currently   Drug use: Never   Social History   Socioeconomic History   Marital status: Married    Spouse name: Lynwood   Number of children: Not on file   Years of education: Not on file   Highest education level: Bachelor's degree (e.g., BA, AB, BS)  Occupational History   Occupation: RETIRED  Tobacco Use   Smoking status: Never   Smokeless tobacco: Never  Vaping Use   Vaping status: Never  Used  Substance and Sexual Activity   Alcohol use: Not Currently   Drug use: Never   Sexual activity: Yes  Other Topics Concern   Not on file  Social History Narrative   Lives with husband/2025   Social Drivers of Health   Tobacco Use: Low Risk (11/20/2024)   Patient History    Smoking Tobacco Use: Never    Smokeless Tobacco Use: Never    Passive Exposure: Not on file  Financial Resource Strain: Low Risk (11/20/2024)   Overall Financial Resource Strain (CARDIA)    Difficulty of Paying Living Expenses: Not hard at all  Food Insecurity: No Food Insecurity (11/20/2024)   Epic    Worried About Radiation Protection Practitioner of Food in the Last Year: Never true    Ran Out of Food in the Last Year: Never true  Transportation Needs: No Transportation Needs (11/20/2024)   Epic    Lack of Transportation (Medical): No    Lack of Transportation (Non-Medical): No  Physical Activity: Insufficiently Active (11/20/2024)   Exercise Vital Sign    Days of Exercise per Week: 1 day    Minutes of Exercise per Session: 30 min  Stress: No Stress Concern Present (11/20/2024)   [employer] of Occupational Health - Occupational Stress Questionnaire    Feeling of Stress: Only a little  Social Connections: Socially Integrated (11/20/2024)   Social Connection and Isolation Panel    Frequency of Communication with Friends and Family: More than three times a week    Frequency of Social Gatherings with Friends and Family: Three times a week    Attends Religious Services: More than 4 times per year    Active Member of Clubs or Organizations: Yes    Attends Banker Meetings: More than 4 times per year    Marital Status: Married  Catering Manager Violence: Not At Risk (11/20/2024)   Epic    Fear of Current or Ex-Partner: No    Emotionally Abused: No    Physically Abused: No    Sexually Abused: No  Depression (PHQ2-9): Low Risk (11/20/2024)   Depression (PHQ2-9)    PHQ-2 Score: 1  Alcohol Screen: Low Risk  (06/01/2024)   Alcohol Screen    Last Alcohol Screening Score (AUDIT): 1  Housing: Low Risk (11/20/2024)  Epic    Unable to Pay for Housing in the Last Year: No    Number of Times Moved in the Last Year: 0    Homeless in the Last Year: No  Utilities: Not At Risk (11/20/2024)   Epic    Threatened with loss of utilities: No  Health Literacy: Adequate Health Literacy (11/20/2024)   B1300 Health Literacy    Frequency of need for help with medical instructions: Never   Family Status  Relation Name Status   Mother Naomie (Not Specified)   Father STERLIN (Not Specified)   Brother Ron (Not Specified)   Sister Elveria (Not Specified)   Daughter Corean (Not Specified)  No partnership data on file   Family History  Problem Relation Age of Onset   Heart disease Mother    Hypertension Mother    Obesity Mother    Vision loss Mother    Alcohol abuse Father    Cancer Brother    Diabetes Sister    Varicose Veins Daughter    Allergies  Allergen Reactions   Cyclobenzaprine Hives   Morphine And Codeine Anxiety    Hallucinations, nausea, vomiting, doesn't help my pain at all   Tetracyclines & Related Nausea And Vomiting   Benzonatate  Rash      Review of Systems  Constitutional:  Positive for malaise/fatigue.  Respiratory:  Negative for shortness of breath.   Cardiovascular:  Negative for chest pain and leg swelling.  Gastrointestinal:  Negative for heartburn, nausea and vomiting.  Musculoskeletal:  Positive for joint pain.  All other systems reviewed and are negative.     Objective:     BP 124/76 (Cuff Size: Large)   Pulse 81   Temp 97.6 F (36.4 C) (Oral)   Resp 18   Ht 5' 4 (1.626 m)   Wt 165 lb 9.6 oz (75.1 kg)   SpO2 97%   BMI 28.43 kg/m  BP Readings from Last 3 Encounters:  12/05/24 124/76  11/20/24 126/83  09/20/24 127/69   Wt Readings from Last 3 Encounters:  12/05/24 165 lb 9.6 oz (75.1 kg)  11/20/24 165 lb (74.8 kg)  09/20/24 173 lb 4.8 oz (78.6 kg)       Physical Exam Constitutional:      Appearance: Normal appearance.  HENT:     Head: Normocephalic and atraumatic.     Mouth/Throat:     Mouth: Mucous membranes are moist.     Pharynx: Oropharynx is clear.  Eyes:     Extraocular Movements: Extraocular movements intact.     Conjunctiva/sclera: Conjunctivae normal.     Pupils: Pupils are equal, round, and reactive to light.  Neck:     Comments: No thyromegaly  Cardiovascular:     Rate and Rhythm: Normal rate and regular rhythm.  Pulmonary:     Effort: Pulmonary effort is normal.     Breath sounds: Normal breath sounds.  Musculoskeletal:     Cervical back: No tenderness.     Right lower leg: No edema.     Left lower leg: No edema.  Lymphadenopathy:     Cervical: No cervical adenopathy.  Skin:    General: Skin is warm and dry.  Neurological:     General: No focal deficit present.     Mental Status: She is alert. Mental status is at baseline.  Psychiatric:        Mood and Affect: Mood normal.        Behavior: Behavior normal.      No  results found for any visits on 12/05/24.  Last CBC Lab Results  Component Value Date   WBC 11.0 (H) 12/06/2023   HGB 13.6 12/06/2023   HCT 41.1 12/06/2023   MCV 88.0 12/06/2023   MCH 29.1 12/06/2023   RDW 13.0 12/06/2023   PLT 408 (H) 12/06/2023   Last metabolic panel Lab Results  Component Value Date   GLUCOSE 77 06/05/2024   NA 141 06/05/2024   K 3.7 06/05/2024   CL 98 06/05/2024   CO2 29 06/05/2024   BUN 19 06/05/2024   CREATININE 1.17 (H) 06/05/2024   EGFR 48 (L) 06/05/2024   CALCIUM  9.8 06/05/2024   PROT 6.7 12/06/2023   ALBUMIN 3.3 (L) 12/26/2020   BILITOT 0.4 12/06/2023   ALKPHOS 58 12/26/2020   AST 18 12/06/2023   ALT 15 12/06/2023   ANIONGAP 12 12/26/2020   Last lipids Lab Results  Component Value Date   CHOL 117 12/06/2023   HDL 52 12/06/2023   LDLCALC 40 12/06/2023   TRIG 176 (H) 12/06/2023   CHOLHDL 2.3 12/06/2023   Last hemoglobin A1c Lab  Results  Component Value Date   HGBA1C 5.7 (H) 12/22/2020   Last thyroid  functions Lab Results  Component Value Date   TSH 1.78 12/06/2023   Last vitamin D  No results found for: 25OHVITD2, 25OHVITD3, VD25OH Last vitamin B12 and Folate Lab Results  Component Value Date   VITAMINB12 1,110 (H) 05/31/2023   FOLATE 12.5 09/01/2022      The ASCVD Risk score (Arnett DK, et al., 2019) failed to calculate for the following reasons:   Risk score cannot be calculated because patient has a medical history suggesting prior/existing ASCVD   * - Cholesterol units were assumed    Assessment & Plan:   Assessment & Plan  Chronic heart failure Severe small vessel coronary artery disease with open stents. Ejection fraction is 30%. Farxiga, Entresto, and spironolactone  are effective. Lasix  discontinued due to kidney concerns, now as needed. No current heart failure symptoms. Discussed potential ICD if EF remains low. - Continue Farxiga, Entresto, and spironolactone . - Use Lasix  as needed for fluid retention. - Monitor for symptoms of urinary tract infections due to Farxiga. - Rechecked kidney function with labs today. - Continue to follow with both Cardiology and CHF specialist.   Atherosclerotic coronary artery disease Severe small vessel coronary artery disease. Stents are open. Lipitor  and Zetia  control LDL levels. - Continue Lipitor  and Zetia .  Type 2 diabetes mellitus Recent A1c of 6.5%. On Mounjaro 5 mg for weight loss and glycemic control. No side effects. Weight loss of 9 pounds. Discussed insurance coverage for [facility] and potential switch to Zepbound. - Continue Mounjaro 5 mg. - Monitor weight and A1c. - Recheck A1c in 3 months. - Communicate weight and A1c status when Mounjaro supply is low.  Primary hypertension Metoprolol  dose increased to 50 mg. Hydrochlorothiazide  discontinued. - Continue metoprolol  50 mg.  Acquired hypothyroidism Thyroid  function to be rechecked  with labs today. - Rechecked thyroid  function with labs today.  Osteoarthritis of right knee with Baker's cyst Recent gel injection. Persistent pain and swelling. Discussed potential knee replacement surgery, but she prefers to avoid surgery. Physical therapy suggested. - Consider physical therapy for muscle strengthening. - Monitor knee pain and swelling.  Overweight BMI of 28. Weight loss of 9 pounds with Mounjaro. Discussed potential for further weight loss and medication adjustments. - Continue Mounjaro 5 mg. - Monitor weight and adjust Mounjaro dose if plateaued.  Restless Leg Symptoms well  controlled.  - Continue Gabapentin  200 mg in the day and 400 mg at bedtime, refills due.   Vitamin D  and B12 deficiency Not currently taking vitamin D  supplements. - Checked vitamin D  levels and Vitamin B12 with labs today.  - CBC w/Diff/Platelet - Comprehensive Metabolic Panel (CMET) - Lipid Profile - furosemide  (LASIX ) 20 MG tablet; Take 0.5 tablets (10 mg total) by mouth daily as needed for edema. - CBC w/Diff/Platelet - Comprehensive Metabolic Panel (CMET) - ezetimibe  (ZETIA ) 10 MG tablet; Take 1 tablet (10 mg total) by mouth daily.  Dispense: 90 tablet; Refill: 0 - atorvastatin  (LIPITOR ) 10 MG tablet; Take 1 tablet (10 mg total) by mouth daily.  Dispense: 90 tablet; Refill: 1 - gabapentin  (NEURONTIN ) 400 MG capsule; Take 1 capsule (400 mg total) by mouth at bedtime.  Dispense: 90 capsule; Refill: 3 - TSH - Vitamin B12 - Vitamin D  (25 hydroxy)   Return in about 3 months (around 03/05/2025).    Sharyle Fischer, DO

## 2024-12-06 ENCOUNTER — Ambulatory Visit: Payer: Self-pay | Admitting: Internal Medicine

## 2024-12-06 DIAGNOSIS — E039 Hypothyroidism, unspecified: Secondary | ICD-10-CM

## 2024-12-06 LAB — COMPREHENSIVE METABOLIC PANEL WITH GFR
AG Ratio: 2.3 (calc) (ref 1.0–2.5)
ALT: 18 U/L (ref 6–29)
AST: 18 U/L (ref 10–35)
Albumin: 4.6 g/dL (ref 3.6–5.1)
Alkaline phosphatase (APISO): 77 U/L (ref 37–153)
BUN/Creatinine Ratio: 12 (calc) (ref 6–22)
BUN: 16 mg/dL (ref 7–25)
CO2: 25 mmol/L (ref 20–32)
Calcium: 9.7 mg/dL (ref 8.6–10.4)
Chloride: 99 mmol/L (ref 98–110)
Creat: 1.29 mg/dL — ABNORMAL HIGH (ref 0.60–1.00)
Globulin: 2 g/dL (ref 1.9–3.7)
Glucose, Bld: 110 mg/dL — ABNORMAL HIGH (ref 65–99)
Potassium: 4.7 mmol/L (ref 3.5–5.3)
Sodium: 134 mmol/L — ABNORMAL LOW (ref 135–146)
Total Bilirubin: 0.8 mg/dL (ref 0.2–1.2)
Total Protein: 6.6 g/dL (ref 6.1–8.1)
eGFR: 42 mL/min/1.73m2 — ABNORMAL LOW (ref >=60)

## 2024-12-06 LAB — CBC WITH DIFFERENTIAL/PLATELET
Absolute Lymphocytes: 980 {cells}/uL (ref 850–3900)
Absolute Monocytes: 487 {cells}/uL (ref 200–950)
Basophils Absolute: 41 {cells}/uL (ref 0–200)
Basophils Relative: 0.7 %
Eosinophils Absolute: 87 {cells}/uL (ref 15–500)
Eosinophils Relative: 1.5 %
HCT: 45.5 % (ref 35.9–46.0)
Hemoglobin: 14.7 g/dL (ref 11.7–15.5)
MCH: 30 pg (ref 27.0–33.0)
MCHC: 32.3 g/dL (ref 31.6–35.4)
MCV: 92.9 fL (ref 81.4–101.7)
MPV: 9.5 fL (ref 7.5–12.5)
Monocytes Relative: 8.4 %
Neutro Abs: 4205 {cells}/uL (ref 1500–7800)
Neutrophils Relative %: 72.5 %
Platelets: 339 Thousand/uL (ref 140–400)
RBC: 4.9 Million/uL (ref 3.80–5.10)
RDW: 14.1 % (ref 11.0–15.0)
Total Lymphocyte: 16.9 %
WBC: 5.8 Thousand/uL (ref 3.8–10.8)

## 2024-12-06 LAB — LIPID PANEL
Cholesterol: 126 mg/dL (ref <200)
HDL: 65 mg/dL (ref >=50)
LDL Cholesterol (Calc): 40 mg/dL
Non-HDL Cholesterol (Calc): 61 mg/dL (ref <130)
Total CHOL/HDL Ratio: 1.9 (calc) (ref <5.0)
Triglycerides: 133 mg/dL (ref <150)

## 2024-12-06 LAB — TSH: TSH: 1.41 m[IU]/L (ref 0.40–4.50)

## 2024-12-06 LAB — VITAMIN B12: Vitamin B-12: 374 pg/mL (ref 200–1100)

## 2024-12-06 LAB — VITAMIN D 25 HYDROXY (VIT D DEFICIENCY, FRACTURES): Vit D, 25-Hydroxy: 33 ng/mL (ref 30–100)

## 2024-12-06 MED ORDER — LEVOTHYROXINE SODIUM 75 MCG PO TABS: 75.0000 ug | ORAL_TABLET | Freq: Every day | ORAL | 1 refills | Status: AC

## 2024-12-15 ENCOUNTER — Encounter: Payer: Self-pay | Admitting: Internal Medicine

## 2024-12-31 ENCOUNTER — Encounter: Payer: Self-pay | Admitting: Internal Medicine

## 2025-01-11 ENCOUNTER — Encounter: Payer: Self-pay | Admitting: Internal Medicine

## 2025-01-12 ENCOUNTER — Other Ambulatory Visit: Payer: Self-pay | Admitting: Internal Medicine

## 2025-01-12 DIAGNOSIS — E1165 Type 2 diabetes mellitus with hyperglycemia: Secondary | ICD-10-CM

## 2025-01-12 DIAGNOSIS — E663 Overweight: Secondary | ICD-10-CM

## 2025-01-12 MED ORDER — MOUNJARO 5 MG/0.5ML ~~LOC~~ SOAJ: 5.0000 mg | SUBCUTANEOUS | 2 refills | Status: AC

## 2025-01-25 ENCOUNTER — Encounter: Payer: Self-pay | Admitting: Internal Medicine

## 2025-01-25 ENCOUNTER — Other Ambulatory Visit (HOSPITAL_COMMUNITY): Payer: Self-pay

## 2025-01-25 ENCOUNTER — Telehealth: Payer: Self-pay | Admitting: Pharmacy Technician

## 2025-01-25 NOTE — Telephone Encounter (Signed)
 Pharmacy Patient Advocate Encounter   Received notification from [employer] that prior authorization for Mounjaro  5MG /0.5ML auto-injectors is required/requested.   Insurance verification completed.   The patient is insured through [employer].   Per test claim: PA required; PA started via CoverMyMeds. KEY [id number] . Waiting for clinical questions to populate.

## 2025-01-25 NOTE — Telephone Encounter (Signed)
 Pharmacy Patient Advocate Encounter  Received notification from [employer] that Prior Authorization for Mounjaro  5MG /0.5ML auto-injectors has been CANCELLED due to:     PA #/Case ID/Reference #: [id number]

## 2025-03-05 ENCOUNTER — Ambulatory Visit: Admitting: Internal Medicine
